# Patient Record
Sex: Male | Born: 1935 | Race: White | Hispanic: No | Smoking: Never smoker
Health system: Southern US, Community
[De-identification: ages and names within clinical notes are randomized; demographics above are authoritative.]

## PROBLEM LIST (undated history)

## (undated) DIAGNOSIS — E785 Hyperlipidemia, unspecified: Secondary | ICD-10-CM

## (undated) DIAGNOSIS — G20A1 Parkinson's disease without dyskinesia, without mention of fluctuations: Secondary | ICD-10-CM

## (undated) DIAGNOSIS — F0390 Unspecified dementia without behavioral disturbance: Secondary | ICD-10-CM

## (undated) DIAGNOSIS — C61 Malignant neoplasm of prostate: Secondary | ICD-10-CM

## (undated) DIAGNOSIS — E538 Deficiency of other specified B group vitamins: Secondary | ICD-10-CM

## (undated) DIAGNOSIS — F329 Major depressive disorder, single episode, unspecified: Secondary | ICD-10-CM

## (undated) DIAGNOSIS — I499 Cardiac arrhythmia, unspecified: Secondary | ICD-10-CM

## (undated) DIAGNOSIS — I509 Heart failure, unspecified: Secondary | ICD-10-CM

## (undated) DIAGNOSIS — G20 Parkinson's disease: Secondary | ICD-10-CM

## (undated) DIAGNOSIS — F32A Depression, unspecified: Secondary | ICD-10-CM

## (undated) HISTORY — PX: PROSTATECTOMY: SHX69

## (undated) HISTORY — DX: Deficiency of other specified B group vitamins: E53.8

## (undated) HISTORY — PX: TRANSURETHRAL RESECTION OF PROSTATE: SHX73

## (undated) HISTORY — DX: Hyperlipidemia, unspecified: E78.5

## (undated) HISTORY — DX: Cardiac arrhythmia, unspecified: I49.9

## (undated) HISTORY — DX: Major depressive disorder, single episode, unspecified: F32.9

## (undated) HISTORY — DX: Parkinson's disease without dyskinesia, without mention of fluctuations: G20.A1

## (undated) HISTORY — PX: CATARACT EXTRACTION: SUR2

## (undated) HISTORY — DX: Depression, unspecified: F32.A

## (undated) HISTORY — DX: Parkinson's disease: G20

## (undated) HISTORY — PX: APPENDECTOMY: SHX54

## (undated) HISTORY — DX: Malignant neoplasm of prostate: C61

## (undated) HISTORY — PX: ANKLE SURGERY: SHX546

---

## 2003-11-27 ENCOUNTER — Ambulatory Visit (HOSPITAL_BASED_OUTPATIENT_CLINIC_OR_DEPARTMENT_OTHER): Admission: RE | Admit: 2003-11-27 | Discharge: 2003-11-27 | Payer: Self-pay | Admitting: Ophthalmology

## 2003-11-27 ENCOUNTER — Ambulatory Visit (HOSPITAL_COMMUNITY): Admission: RE | Admit: 2003-11-27 | Discharge: 2003-11-28 | Payer: Self-pay | Admitting: Ophthalmology

## 2016-03-26 ENCOUNTER — Encounter: Payer: Self-pay | Admitting: Neurology

## 2016-03-30 ENCOUNTER — Ambulatory Visit (INDEPENDENT_AMBULATORY_CARE_PROVIDER_SITE_OTHER): Payer: Medicare Other | Admitting: Neurology

## 2016-03-30 ENCOUNTER — Encounter: Payer: Self-pay | Admitting: Neurology

## 2016-03-30 VITALS — BP 106/58 | HR 72 | Ht 69.0 in | Wt 164.0 lb

## 2016-03-30 DIAGNOSIS — G20A1 Parkinson's disease without dyskinesia, without mention of fluctuations: Secondary | ICD-10-CM

## 2016-03-30 DIAGNOSIS — F458 Other somatoform disorders: Secondary | ICD-10-CM | POA: Diagnosis not present

## 2016-03-30 DIAGNOSIS — F0280 Dementia in other diseases classified elsewhere without behavioral disturbance: Secondary | ICD-10-CM

## 2016-03-30 DIAGNOSIS — G20 Parkinson's disease: Secondary | ICD-10-CM

## 2016-03-30 DIAGNOSIS — G4700 Insomnia, unspecified: Secondary | ICD-10-CM | POA: Diagnosis not present

## 2016-03-30 DIAGNOSIS — R1319 Other dysphagia: Secondary | ICD-10-CM

## 2016-03-30 MED ORDER — CARBIDOPA-LEVODOPA 25-100 MG PO TABS: ORAL_TABLET | ORAL | Status: DC

## 2016-03-30 NOTE — Patient Instructions (Addendum)
1.  Decrease klonopin to 0.5 mg - 1.5 tablets at night for a month and if doing well drop to 1 tablet at night 2.  Let me know if you don't hear from physical, occupational or speech therapy 3.  Get copy of MR from [facility]

## 2016-03-30 NOTE — Progress Notes (Signed)
Eric Hendricks was seen today in the movement disorders clinic for neurologic consultation at the request of [name], [name], MD.  I have reviewed prior records made available to me. He is accompanied by his son who supplements the history.  Pts son reports that he was being seen by a [facility] physician for several years prior to seeing [name] but those records aren't available.  First sx was R hand tremor.  Has been on other medication prior to levodopa but doesn't remember what the medication was.  Son does state that when he was first started on carbidopa/levodopa 25/100 the dose was 2 po qid and it made him agitated and it was backed down to 1.5 in the AM and 1 tablet at 3 additional times and that is the dose that he remains on today.   The patient first saw Dr. [name] on 06/05/2015.  At that time, the patient reported right hand tremor for about 1-1/2 years and reported that he had also been on levodopa for about 1-1/2 years.  Records indicate that the patient had been on Seroquel 300 mg for several years, but had been off of it for about a year when Dr. [name] first saw the patient.  Dr. 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  The patient apparently had neuroimaging in [facility] that did not look like NPH.  He was already on Aricept, 10 mg and Namenda, 10 mg bid when first seen by Dr. [name].  Pt lives alone in independent living and is on klonopin 0.5 mg, 2 po q hs.  They do not know if this is for REM behavior disorder but state that they need orders if I would like to continue that.   He was last seen by Dr. [name] on 10/01/2015.  This is the last time he was seen at [facility].  The patient needed a neurologist closer to home, which is the reason for transfer.    Specific Symptoms:  Tremor: Yes.   (both hands per son and occasionally in head per  son) Family hx of similar:  Yes.  , brother with PD and mother may have had PD, although not formally dx Voice: softer Sleep: sleeps poor per pt (ever since death of wife) but able to get back to sleep  Vivid Dreams:  Yes.  , "some are horrible, really horrible"  Acting out dreams:  No. Wet Pillows: No. Postural symptoms:  Yes.    Falls?  Yes.  , last 6 weeks ago and that was unusual (last PT was 1.5 years ago at least) Bradykinesia symptoms: difficulty getting out of a chair Loss of smell:  unknown Loss of taste:  Yes.   Urinary Incontinence:  No. but had gross hematuria and has urology appt tomorrow Difficulty Swallowing:  Yes.  , states had swallow study 2 months ago at [facility] and was told he needed speech/swallow therapy Handwriting, micrographia: No. - writes very big because because cannot see Trouble with ADL's:  No.  Trouble buttoning clothing: Yes.   Depression:  Yes.   (dx with "rapid cycling bipolar" per son) Memory changes:  Yes.   (doesn't drive; meds distributed at independent living) Hallucinations:  No.  visual distortions: No. N/V:  No. Lightheaded:  Yes.  , but rarely  Syncope: No. Diplopia:  No. Dyskinesia:  No.   ALLERGIES:   Allergies  Allergen Reactions  . Guanfacine   . Phenylephrine   . Suprax [Cefixime]     CURRENT MEDICATIONS:  Outpatient Encounter Prescriptions as of 03/30/2016  Medication Sig  . amiodarone (PACERONE) 200 MG tablet Take 200 mg by mouth daily.  . Ascorbic Acid (VITAMIN C) 1000 MG tablet Take 1,000 mg by mouth daily.  Marland Kitchen aspirin 325 MG EC tablet Take 325 mg by mouth daily.  Marland Kitchen b complex vitamins tablet Take 1 tablet by mouth daily.  . carbidopa-levodopa (SINEMET IR) 25-100 MG tablet Take 1.5 in the morning, 1 three times daily after  . clonazePAM (KLONOPIN) 0.5 MG tablet Take 1 mg by mouth at bedtime.   . donepezil (ARICEPT) 10 MG tablet Take 10 mg by mouth at bedtime.  Marland Kitchen lisinopril (PRINIVIL,ZESTRIL) 5 MG tablet Take 5 mg by mouth  daily.  . memantine (NAMENDA) 10 MG tablet Take 10 mg by mouth 2 (two) times daily.  . mirtazapine (REMERON) 15 MG tablet Take 15 mg by mouth at bedtime.  . multivitamin-lutein (OCUVITE-LUTEIN) CAPS capsule Take 1 capsule by mouth daily.  Vladimir Faster Glycol-Propyl Glycol (SYSTANE FREE OP) Apply to eye.  . ranitidine (ZANTAC) 150 MG capsule Take 150 mg by mouth 2 (two) times daily.  . tamsulosin (FLOMAX) 0.4 MG CAPS capsule Take 0.4 mg by mouth.  . venlafaxine XR (EFFEXOR-XR) 150 MG 24 hr capsule Take 150 mg by mouth daily with breakfast.  . [DISCONTINUED] carbidopa-levodopa (SINEMET IR) 25-100 MG tablet Take by mouth. 1.5 in the morning, 1 TID  . [DISCONTINUED] omeprazole (PRILOSEC) 10 MG capsule Take 10 mg by mouth daily.  . [DISCONTINUED] pravastatin (PRAVACHOL) 20 MG tablet Take 20 mg by mouth daily.  . [DISCONTINUED] venlafaxine (EFFEXOR) 37.5 MG tablet Take 37.5 mg by mouth daily.  . [DISCONTINUED] vitamin B-12 (CYANOCOBALAMIN) 1000 MCG tablet Take 1,000 mcg by mouth daily.   No facility-administered encounter medications on file as of 03/30/2016.    PAST MEDICAL HISTORY:   Past Medical History  Diagnosis Date  . B12 deficiency   . Depression   . Hyperlipemia   . Parkinson's disease ([place])   . Prostate cancer [facility])     PAST SURGICAL HISTORY:   Past Surgical History  Procedure Laterality Date  . Appendectomy    . Prostatectomy    . Cataract extraction Bilateral   . Ankle surgery Right     SOCIAL HISTORY:   Social History   Social History  . Marital Status: Married    Spouse Name: N/A  . Number of Children: N/A  . Years of Education: N/A   Occupational History  . Not on file.   Social History Main Topics  . Smoking status: Never Smoker   . Smokeless tobacco: Not on file  . Alcohol Use: No  . Drug Use: No  . Sexual Activity: Not on file   Other Topics Concern  . Not on file   Social History Narrative    FAMILY HISTORY:   Family Status  Relation Status  Death Age  . Father Deceased     cancer  . Mother Deceased     tremor  . Brother Deceased     Parkinson's Disease  . Brother Alive     prostate cancer  . Sister Deceased     emphysema, lung cancer    ROS:  A complete 10 system review of systems was obtained and was unremarkable apart from what is mentioned above.  PHYSICAL EXAMINATION:    VITALS:   Filed  Vitals:   03/30/16 1326  BP: 106/58  Pulse: 72  Height: 5\' 9"  (1.753 m)  Weight: 164 lb (74.39 kg)    GEN:  The patient appears stated age and is in NAD. HEENT:  Normocephalic, atraumatic.  The mucous membranes are moist. The superficial temporal arteries are without ropiness or tenderness. CV:  RRR Lungs:  CTAB Neck/HEME:  There are no carotid bruits bilaterally.  Neurological examination:  Orientation: Attempted MoCA but patient was far too [place] and could not be completed.  The patient is alert and oriented x3.  Cranial nerves: There is good facial symmetry. There is facial hypomimia.  Pupils are equal round and reactive to light bilaterally. Fundoscopic exam reveals clear margins bilaterally. Extraocular muscles are intact. The visual fields are full to confrontational testing. The speech is fluent and clear. He is hypophonic.  Soft palate rises symmetrically and there is no tongue deviation. Hearing is markedly decreased to conversational tone. Sensation: Sensation is intact to light and pinprick throughout (facial, trunk, extremities). Vibration is decreased at the bilateral big toe. There is no extinction with double simultaneous stimulation. There is no sensory dermatomal level identified. Motor: Strength is 5/5 in the bilateral upper and lower extremities.   Shoulder shrug is equal and symmetric.  There is no pronator drift. Deep tendon reflexes: Deep tendon reflexes are 1/4 at the bilateral biceps, triceps, brachioradialis, patella and achilles. Plantar responses are downgoing bilaterally.  Movement examination: Tone:  There is mild increased tone in the RUE.  There is normal tone elsewhere.   Abnormal movements: none Coordination:  There is minor decremation with RAM's, seen mostly with heel taps on the L Gait and Station: The patient has mild difficulty arising out of a deep-seated chair without the use of the hands (he is able to arise but falls back initially into the chair when not using the hands). The patient's stride length is short but wide based.       ASSESSMENT/PLAN:  1.  Parkinsonism.  Could be idiopathic PD but wonder if this isn't vascular parkinsonism given the wide based gait. Nonetheless, I didn't change his meds today as he looked pretty good and thinks that they are helping.    -continue carbidopa/levodopa 25/100, 1.5 tablets in the AM followed by 1 tablet three other times during the day. Refilled that today  -We discussed the diagnosis as well as pathophysiology of the disease.  We discussed treatment options as well as prognostic indicators.  Patient education was provided.  -Greater than 50% of the 60 minute visit was spent in counseling answering questions and talking about what to expect now as well as in the future.    We talked about safety in the home. Living independently in assisted living but meds/food being given to him.  Not driving.  Has a safe environment.   -will order PT/OT/ST to come to independent living  -noted that pt on amiodarone.  Don't have his old records so not sure how long he has been on the medication (was not listed as an outpatient prescriptions when the patient was seen at [facility]) but while tremor is common on amiodarone (about 33% of patients), parkinsonism is not a common side effect, but it also is not a side effect that is not unheard of.  This should be considered as we treat him.  -I asked the patient/son to get me a copy of his MRI of the brain from [facility]    2.  Dysphagia  -Had MBE  on 02/24/16 at [place] and I reviewed and demonstrated  trace silent aspiration with thin liquids.  There was mild-mod oropharyngeal dysphagia.    ST was recommended and small bites with single small sips was recommended.  Ordered ST today  3.   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  He is on 1 mg at night and would like to see this tapered at least a little bit.  I am going to have him drop this to clonazepam 0.5 mg, 1-1/2 tablets at night for a month and if he tolerates this well, then I will have him drop this to clonazepam 0.5 mg, one tablet at night.  4.  Probable memory loss/dementia  -As above, a MoCA was attempted, but ultimately had to be abandoned because the patient could not hear and did not have his hearing aids in.  -Will remain on Namenda, 10 mg twice a day and Aricept, 10 mg daily.  5.  I will plan on seeing him back in the next 3-4 months, sooner should new neurologic issues arise.  Greater than 50% of the 60 minute visit in counseling and coordinating care.

## 2016-04-14 ENCOUNTER — Telehealth: Payer: Self-pay | Admitting: Neurology

## 2016-04-15 NOTE — Telephone Encounter (Signed)
Spoke with patient's son and he states that they have not heard from home health company about starting patient's therapies. He then said his dad said he was starting therapy Monday. Wrote message to [facility] home health to contact patient's son to keep him in the loop of patient's therapies since patient's memory is not so good. Patient's son agreed with this plan and will call back if needed.

## 2016-04-20 ENCOUNTER — Telehealth: Payer: Self-pay | Admitting: Neurology

## 2016-04-20 NOTE — Telephone Encounter (Addendum)
Deneise Lever, PT with [facility], called to request verbal order for Physical therapy 2 x wk for 4 weeks. [id number]. Order given.

## 2016-04-21 ENCOUNTER — Telehealth: Payer: Self-pay | Admitting: Neurology

## 2016-04-21 NOTE — Telephone Encounter (Signed)
Sonal, ST with [facility], called for verbal orders to see patient 2 times weekly for four weeks. Verbal order given. [id number].

## 2016-05-12 ENCOUNTER — Telehealth: Payer: Self-pay | Admitting: Neurology

## 2016-05-12 NOTE — Telephone Encounter (Signed)
Verbal ok to continue PT 2xs week for 2 weeks Please call Hilliard Clark at [facility] - can leave him a message.

## 2016-05-13 NOTE — Telephone Encounter (Signed)
LMOM with okay for PT.

## 2016-08-04 NOTE — Progress Notes (Signed)
Eric Hendricks was seen today in the movement disorders clinic for neurologic consultation at the request of [name], Eric Mate, MD.  I have reviewed prior records made available to me. He is accompanied by his son who supplements the history.  Pts son reports that he was being seen by a [facility] physician for several years prior to seeing Dr Eric Hendricks but those records aren't available.  First sx was R hand tremor.  Has been on other medication prior to levodopa but doesn't remember what the medication was.  Son does state that when he was first started on carbidopa/levodopa 25/100 the dose was 2 po qid and it made him agitated and it was backed down to 1.5 in the AM and 1 tablet at 3 additional times and that is the dose that he remains on today.   The patient first saw Dr. Anna Hendricks on 06/05/2015.  At that time, the patient reported right hand tremor for about 1-1/2 years and reported that he had also been on levodopa for about 1-1/2 years.  Records indicate that the patient had been on Seroquel 300 mg for several years, but had been off of it for about a year when Dr. Anna Hendricks first saw the patient.  Dr. Anna Hendricks felt that the patient likely had idiopathic Parkinson's disease, but felt that vascular parkinsonism, and even NPH was in the differential, given the patient had wide-based gait.  The patient apparently had neuroimaging in [facility] that did not look like NPH.  He was already on Aricept, 10 mg and Namenda, 10 mg bid when first seen by Dr. Anna Hendricks.  Pt lives alone in independent living and is on klonopin 0.5 mg, 2 po q hs.  They do not know if this is for REM behavior disorder but state that they need orders if I would like to continue that.   He was last seen by Dr. Anna Hendricks on 10/01/2015.  This is the last time he was seen at [facility].  The patient needed a neurologist closer to home, which is the reason for transfer.    08/05/16 update:  The patient returns today for follow-up, accompanied by his son who  supplements the history.  He remains on carbidopa/levodopa 25/100, 1-1/2 tablets in the morning, followed by one tablet 3 additional times throughout the day.  Last visit, I asked him to try to wean his nighttime clonazepam down somewhat to see if he would be able to get down to 0.5 mg, one tablet at night (he was at 2 tablets at night).  He states that he didn't do that and son asks me to write a note to the facility to do that and they will try that as facility gives him his meds.  He remains on Aricept and Namenda for memory change.  He is an independent living and states that he is doing well with this.  He did home PT.  One fall but really was a passing out episode.  He was taken to [facility] and had a PPM placed on 06/17/16.  Records reviewed.  Had bradycardia with LBBB and 40 pt drop in SBP with orthostatics.  Had a small SDH.  F/u with neurology 07/13/16 and they let him restart his ASA (saw NP - those records reviewed).   No hallucinations.  Mood has been good.  Feels good now.  Denies headache.  Denies lateralizing weakness or paresthesias.  C/o constipation   ALLERGIES:   Allergies  Allergen Reactions  . Guanfacine   .  Phenylephrine   . Suprax [Cefixime]     CURRENT MEDICATIONS:  Outpatient Encounter Prescriptions as of 08/05/2016  Medication Sig  . amiodarone (PACERONE) 200 MG tablet Take 200 mg by mouth daily.  . Ascorbic Acid (VITAMIN C) 1000 MG tablet Take 1,000 mg by mouth daily.  Marland Kitchen aspirin 325 MG EC tablet Take 325 mg by mouth daily.  Marland Kitchen b complex vitamins tablet Take 1 tablet by mouth daily.  . carbidopa-levodopa (SINEMET IR) 25-100 MG tablet Take 1.5 in the morning, 1 three times daily after  . clonazePAM (KLONOPIN) 0.5 MG tablet Take 1 tablet (0.5 mg total) by mouth at bedtime.  . docusate sodium (COLACE) 100 MG capsule Take 100 mg by mouth 2 (two) times daily.  Marland Kitchen donepezil (ARICEPT) 10 MG tablet Take 10 mg by mouth at bedtime.  Marland Kitchen lisinopril (PRINIVIL,ZESTRIL) 5 MG tablet Take 5  mg by mouth daily.  . memantine (NAMENDA) 10 MG tablet Take 10 mg by mouth 2 (two) times daily.  . mirtazapine (REMERON) 15 MG tablet Take 15 mg by mouth at bedtime.  . multivitamin-lutein (OCUVITE-LUTEIN) CAPS capsule Take 1 capsule by mouth daily.  Vladimir Faster Glycol-Propyl Glycol (SYSTANE FREE OP) Apply to eye.  . venlafaxine XR (EFFEXOR-XR) 150 MG 24 hr capsule Take 150 mg by mouth daily with breakfast.  . [DISCONTINUED] clonazePAM (KLONOPIN) 0.5 MG tablet Take 1 mg by mouth at bedtime.   . [DISCONTINUED] ranitidine (ZANTAC) 150 MG capsule Take 150 mg by mouth 2 (two) times daily.  . [DISCONTINUED] tamsulosin (FLOMAX) 0.4 MG CAPS capsule Take 0.4 mg by mouth.   No facility-administered encounter medications on file as of 08/05/2016.     PAST MEDICAL HISTORY:   Past Medical History:  Diagnosis Date  . B12 deficiency   . Depression   . Hyperlipemia   . Parkinson's disease ([place])   . Prostate cancer ([place])     PAST SURGICAL HISTORY:   Past Surgical History:  Procedure Laterality Date  . ANKLE SURGERY Right   . APPENDECTOMY    . CATARACT EXTRACTION Bilateral   . PROSTATECTOMY      SOCIAL HISTORY:   Social History   Social History  . Marital status: Married    Spouse name: N/A  . Number of children: N/A  . Years of education: N/A   Occupational History  . Not on file.   Social History Main Topics  . Smoking status: Never Smoker  . Smokeless tobacco: Not on file  . Alcohol use No  . Drug use: No  . Sexual activity: Not on file   Other Topics Concern  . Not on file   Social History Narrative  . No narrative on file    FAMILY HISTORY:   Family Status  Relation Status  . Father Deceased   cancer  . Mother Deceased   tremor  . Brother Deceased   Parkinson's Disease  . Brother Alive   prostate cancer  . Sister Deceased   emphysema, lung cancer    ROS:  A complete 10 system review of systems was obtained and was unremarkable apart from what is mentioned  above.  PHYSICAL EXAMINATION:    VITALS:   Vitals:   08/05/16 1304  BP: 130/68  Pulse: 78  Weight: 171 lb (77.6 kg)  Height: 5\' 9"  (1.753 m)    GEN:  The patient appears stated age and is in NAD. HEENT:  Normocephalic, atraumatic.  The mucous membranes are moist. The superficial temporal arteries are without  ropiness or tenderness. CV:  RRR Lungs:  CTAB Neck/HEME:  There are no carotid bruits bilaterally.  Neurological examination:  Orientation: Pt is alert but relies on son for history.   Cranial nerves: There is good facial symmetry. There is facial hypomimia.   Extraocular muscles are intact. The visual fields are full to confrontational testing. The speech is fluent and clear. He is hypophonic.  Soft palate rises symmetrically and there is no tongue deviation. Hearing is markedly to conversational tone but he has hearing aids in today. Sensation: Sensation is intact to light and pinprick throughout (facial, trunk, extremities). Vibration is decreased at the bilateral big toe. There is no extinction with double simultaneous stimulation. There is no sensory dermatomal level identified. Motor: Strength is 5/5 in the bilateral upper and lower extremities.   Shoulder shrug is equal and symmetric.  There is no pronator drift. Deep tendon reflexes: Deep tendon reflexes are 1/4 at the bilateral biceps, triceps, brachioradialis, patella and achilles. Plantar responses are downgoing bilaterally.  Movement examination: Tone: There is mild increased tone in the RUE.  There is normal tone elsewhere.   Abnormal movements: none Coordination:  There is minor decremation with RAM's, seen mostly with heel taps on the L Gait and Station: The patient easily is arises out of chair.  The patient's stride length is short but wide based.  He shuffles just slightly.     ASSESSMENT/PLAN:  1.  Parkinsonism.  Could be idiopathic PD but wonder if this isn't vascular parkinsonism given the wide based gait.  Nonetheless, I didn't change his meds today as he looked pretty good and thinks that they are helping.    -continue carbidopa/levodopa 25/100, 1.5 tablets in the AM followed by 1 tablet three other times during the day.  -We talked about safety in the home. Living independently in assisted living but meds/food being given to him.  Not driving.  Has a safe environment.   -noted that pt on amiodarone.  Don't have his old records so not sure how long he has been on the medication (was not listed as an outpatient prescriptions when the patient was seen at [facility]) but while tremor is common on amiodarone (about 33% of patients), parkinsonism is not a common side effect, but it also is not a side effect that is not unheard of.  This should be considered as we treat him.  2.  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  Patient is doing much better.  3.  Dysphagia  -Had MBE on 02/24/16 at [place] and I reviewed and demonstrated trace silent aspiration with thin liquids.  There was mild-mod oropharyngeal dysphagia.    ST was recommended and small bites with single small sips was recommended.    4.   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  5.  Probable memory loss/dementia  -Will remain on Namenda, 10 mg twice a day and Aricept, 10 mg daily.  6.  Constipation  -rancho recipe given  7.  I will plan on seeing him back in the next 6 months, sooner should new neurologic issues arise.  Greater than 50% of the 60 minute visit in counseling and coordinating care.  Much greater than 50% of this visit was spent in counseling and coordinating care.  Total face to face time:  25 min

## 2016-08-05 ENCOUNTER — Ambulatory Visit (INDEPENDENT_AMBULATORY_CARE_PROVIDER_SITE_OTHER): Payer: Medicare Other | Admitting: Neurology

## 2016-08-05 ENCOUNTER — Encounter: Payer: Self-pay | Admitting: Neurology

## 2016-08-05 VITALS — BP 130/68 | HR 78 | Ht 69.0 in | Wt 171.0 lb

## 2016-08-05 DIAGNOSIS — G20 Parkinson's disease: Secondary | ICD-10-CM | POA: Diagnosis not present

## 2016-08-05 DIAGNOSIS — F0280 Dementia in other diseases classified elsewhere without behavioral disturbance: Secondary | ICD-10-CM

## 2016-08-05 MED ORDER — CLONAZEPAM 0.5 MG PO TABS: 0.5000 mg | ORAL_TABLET | Freq: Every day | ORAL | 5 refills | Status: DC

## 2016-08-05 NOTE — Patient Instructions (Addendum)
1.  Decrease klonopin 0.5 mg - 1 tablet at night 2.  You look great! 3.  I hope that you have a great thanksgiving and Christmas! 4.  Constipation and Parkinson's disease:   1.Rancho recipe for constipation in Parkinsons Disease:  -1 cup of unprocessed bran (need to get this at [employer], [employer] or similar type of store), 2 cups of applesauce in 1 cup of  prune juice  2.  Increase fiber intake (Metamucil,vegetables)  3.  Regular, moderate exercise can be beneficial.  4.  Avoid medications causing constipation, such as medications like  antacids with calcium or magnesium  5.  Laxative overuse should be avoided.  6.  Stool softeners (Colace) can help with chronic constipation.  7.  Increase water intake.  You should be drinking 1/2 gallon of water  a day as long as you have not been diagnosed with congestive heart  failure or renal/kidney failure.  This is probably the single greatest  thing that you can do to help your constipation.

## 2016-09-08 ENCOUNTER — Other Ambulatory Visit: Payer: Self-pay | Admitting: Neurology

## 2016-12-28 ENCOUNTER — Encounter: Payer: Self-pay | Admitting: Emergency Medicine

## 2016-12-28 ENCOUNTER — Emergency Department (INDEPENDENT_AMBULATORY_CARE_PROVIDER_SITE_OTHER)
Admission: EM | Admit: 2016-12-28 | Discharge: 2016-12-28 | Disposition: A | Discharge status: Home / Self Care | Payer: Medicare Other | Source: Home / Self Care | Attending: Family Medicine | Admitting: Family Medicine

## 2016-12-28 DIAGNOSIS — S61012A Laceration without foreign body of left thumb without damage to nail, initial encounter: Secondary | ICD-10-CM | POA: Diagnosis not present

## 2016-12-28 HISTORY — DX: Heart failure, unspecified: I50.9

## 2016-12-28 NOTE — ED Triage Notes (Signed)
Pt c/o cutting left hand with box cutter this afternoon. States the nurse at the rest home cleaned and bandaged it. Unsure of last tetanus.

## 2016-12-28 NOTE — ED Triage Notes (Signed)
Spoke with susan the nurse at [facility] pts PCP, states he had his tetanus in January 2014

## 2016-12-28 NOTE — ED Provider Notes (Signed)
[name]    CSN: [id number] Arrival date & time: 12/28/16  1521     History   Chief Complaint Chief Complaint  Patient presents with  . Laceration    HPI Eric Hendricks is a 83 y.o. male.   Patient cut his left thumb with a box cutter about 1.5 hours ago.  His last Tdap was in 2014.   The history is provided by the patient and a relative.  Laceration  Location:  Finger Finger laceration location:  L thumb Length:  1.5cm Depth:  Through dermis Quality: straight   Bleeding: controlled   Time since incident:  90 minutes Laceration mechanism:  Knife Pain details:    Quality:  Aching   Severity:  Mild   Timing:  Constant   Progression:  Unchanged Foreign body present:  No foreign bodies Worsened by:  Movement Ineffective treatments:  None tried Tetanus status:  Up to date Associated symptoms: swelling     Past Medical History:  Diagnosis Date  . B12 deficiency   . Depression   . Hyperlipemia   . Parkinson's disease ([place])   . Prostate cancer ([place])     There are no active problems to display for this patient.   Past Surgical History:  Procedure Laterality Date  . ANKLE SURGERY Right   . APPENDECTOMY    . CATARACT EXTRACTION Bilateral   . PROSTATECTOMY         Home Medications    Prior to Admission medications   Medication Sig Start Date End Date Taking? Authorizing Provider  amiodarone (PACERONE) 200 MG tablet Take 200 mg by mouth daily.    Historical Provider, MD  Ascorbic Acid (VITAMIN C) 1000 MG tablet Take 1,000 mg by mouth daily.    Historical Provider, MD  aspirin 325 MG EC tablet Take 325 mg by mouth daily.    Historical Provider, MD  b complex vitamins tablet Take 1 tablet by mouth daily.    Historical Provider, MD  carbidopa-levodopa (SINEMET IR) 25-100 MG tablet TAKE 1 & 1/2 TABLET BY MOUTH EVERY MORNING TAKE 1 TABLET BY MOUTH THREE TIMES A DAY 09/08/16   Rebecca S Tat, DO  clonazePAM (KLONOPIN) 0.5 MG tablet Take 1 tablet  (0.5 mg total) by mouth at bedtime. 08/05/16   Eustace Quail Tat, DO  docusate sodium (COLACE) 100 MG capsule Take 100 mg by mouth 2 (two) times daily.    Historical Provider, MD  donepezil (ARICEPT) 10 MG tablet Take 10 mg by mouth at bedtime.    Historical Provider, MD  lisinopril (PRINIVIL,ZESTRIL) 5 MG tablet Take 5 mg by mouth daily.    Historical Provider, MD  memantine (NAMENDA) 10 MG tablet Take 10 mg by mouth 2 (two) times daily.    Historical Provider, MD  mirtazapine (REMERON) 15 MG tablet Take 15 mg by mouth at bedtime.    Historical Provider, MD  multivitamin-lutein (OCUVITE-LUTEIN) CAPS capsule Take 1 capsule by mouth daily.    Historical Provider, MD  Polyethyl Glycol-Propyl Glycol (SYSTANE FREE OP) Apply to eye.    Historical Provider, MD  venlafaxine XR (EFFEXOR-XR) 150 MG 24 hr capsule Take 150 mg by mouth daily with breakfast.    Historical Provider, MD    Family History History reviewed. No pertinent family history.  Social History Social History  Substance Use Topics  . Smoking status: Never Smoker  . Smokeless tobacco: Never Used  . Alcohol use No     Allergies   Guanfacine; Phenylephrine; and Suprax [  cefixime]   Review of Systems Review of Systems  All other systems reviewed and are negative.    Physical Exam Triage Vital Signs ED Triage Vitals [12/28/16 1551]  Enc Vitals Group     BP (!) 110/58     Pulse Rate 76     Resp      Temp 97.8 F (36.6 C)     Temp Source Oral     SpO2 94 %     Weight 174 lb (78.9 kg)     Height      Head Circumference      Peak Flow      Pain Score 2     Pain Loc      Pain Edu?      Excl. in [place]?    No data found.   Updated Vital Signs BP (!) 110/58 (BP Location: Right Arm)   Pulse 76   Temp 97.8 F (36.6 C) (Oral)   Wt 174 lb (78.9 kg)   SpO2 94%   BMI 25.70 kg/m   Visual Acuity Right Eye Distance:   Left Eye Distance:   Bilateral Distance:    Right Eye Near:   Left Eye Near:    Bilateral Near:      Physical Exam  Constitutional: He appears well-developed and well-nourished. No distress.  HENT:  Head: Atraumatic.  Eyes: Pupils are equal, round, and reactive to light.  Cardiovascular: Normal rate.   Pulmonary/Chest: Effort normal.  Musculoskeletal:       Left hand: He exhibits decreased range of motion and laceration. He exhibits no tenderness and no swelling. Decreased sensation noted.       Hands: Left thumb has a 1.5cm simple laceration proximal phalanx dorsally as noted on diagram.  Finger has full range of motion.  Distal neurovascular function is intact.   Neurological: He is alert.  Skin: Skin is warm and dry.  Nursing note and vitals reviewed.    UC Treatments / Results  Labs (all labs ordered are listed, but only abnormal results are displayed) Labs Reviewed - No data to display  EKG  EKG Interpretation None       Radiology No results found.  Procedures Procedures  Laceration Repair Discussed benefits and risks of procedure and verbal consent obtained. Using sterile technique and digital anesthesia with 2% lidocaine without epinephrine, cleansed wound with Betadine followed by copious lavage with normal saline.  Wound carefully inspected for debris and foreign bodies; none found.  Wound closed with #5, 4-0 interrupted Prolene sutures.  Bacitracin and non-stick sterile dressing applied.  Wound precautions explained to patient.  Return for suture removal in 10 days.     Medications Ordered in UC Medications - No data to display   Initial Impression / Assessment and Plan / UC Course  I have reviewed the triage vital signs and the nursing notes.  Pertinent labs & imaging results that were available during my care of the patient were reviewed by me and considered in my medical decision making (see chart for details).    Change dressing daily and apply Bacitracin ointment to wound.  Keep wound clean and dry.  Return for any signs of infection (or follow-up  with family doctor):  Increasing redness, swelling, pain, heat, drainage, etc. Return in 10 days for suture removal.      Final Clinical Impressions(s) / UC Diagnoses   Final diagnoses:  Laceration of left thumb without foreign body without damage to nail, initial encounter  New Prescriptions New Prescriptions   No medications on file     Kandra Nicolas, MD 12/28/16 1642

## 2016-12-28 NOTE — Discharge Instructions (Signed)
Change dressing daily and apply Bacitracin ointment to wound.  Keep wound clean and dry.  Return for any signs of infection (or follow-up with family doctor):  Increasing redness, swelling, pain, heat, drainage, etc. °Return in 10 days for suture removal.   °

## 2017-01-07 ENCOUNTER — Encounter: Payer: Self-pay | Admitting: *Deleted

## 2017-01-24 ENCOUNTER — Other Ambulatory Visit: Payer: Self-pay | Admitting: Neurology

## 2017-02-01 NOTE — Progress Notes (Signed)
Eric Hendricks was seen today in the movement disorders clinic for neurologic consultation at the request of [name], MD.  I have reviewed prior records made available to me. He is accompanied by his son who supplements the history.  Pts son reports that he was being seen by a [facility] physician for several years prior to seeing [name] but those records aren't available.  First sx was R hand tremor.  Has been on other medication prior to levodopa but doesn't remember what the medication was.  Son does state that when he was first started on carbidopa/levodopa 25/100 the dose was 2 po qid and it made him agitated and it was backed down to 1.5 in the AM and 1 tablet at 3 additional times and that is the dose that he remains on today.   The patient first saw Dr. [name] on 06/05/2015.  At that time, the patient reported right hand tremor for about 1-1/2 years and reported that he had also been on levodopa for about 1-1/2 years.  Records indicate that the patient had been on Seroquel 300 mg for several years, but had been off of it for about a year when Dr. [name] first saw the patient.  Dr. 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  The patient apparently had neuroimaging in [facility] that did not look like NPH.  He was already on Aricept, 10 mg and Namenda, 10 mg bid when first seen by Dr. [name].  Pt lives alone in independent living and is on klonopin 0.5 mg, 2 po q hs.  They do not know if this is for REM behavior disorder but state that they need orders if I would like to continue that.   He was last seen by Dr. [name] on 10/01/2015.  This is the last time he was seen at [facility].  The patient needed a neurologist closer to home, which is the reason for transfer.    08/05/16 update:  The patient returns today for follow-up, accompanied by his son who  supplements the history.  He remains on carbidopa/levodopa 25/100, 1-1/2 tablets in the morning, followed by one tablet 3 additional times throughout the day.  Last visit, I asked him to try to wean his nighttime clonazepam down somewhat to see if he would be able to get down to 0.5 mg, one tablet at night (he was at 2 tablets at night).  He states that he didn't do that and son asks me to write a note to the facility to do that and they will try that as facility gives him his meds.  He remains on Aricept and Namenda for memory change.  He is an independent living and states that he is doing well with this.  He did home PT.  One fall but really was a passing out episode.  He was taken to [facility] and had a PPM placed on 06/17/16.  Records reviewed.  Had bradycardia with LBBB and 40 pt drop in SBP with orthostatics.  Had a small SDH.  F/u with neurology 07/13/16 and they let him restart his ASA (saw NP - those records reviewed).   No hallucinations.  Mood has been good.  Feels good now.  Denies headache.  Denies lateralizing weakness or paresthesias.  C/o constipation  02/03/17 update:  The patient returns today for follow up.  This patient is  accompanied in the office by his son who supplements the history.  He moved down downstairs to a 2 bedroom apartment.  Someone brings him medication 4 times per day.  Cooking, cleaning is done for him.   Pt on carbidopa/levodopa 25/100, 1.5 tablets in the morning and 3 other single tablets throughout the day.  I asked him last visit to decrease his clonazepam to 0.5 mg, one tablet at night (from 2 tablets on) and he has done that.  He remains on Aricept and Namenda.  He was in the emergency room on 12/28/2016 after he cut his thumb with a box cutter.  This was closed with sutures.  Fell right before christmas as missed stairs and fell.  One other fall in hallway - turned to fast and fell but got himself up and didn't get hurt.  No hallucinations.     ALLERGIES:   Allergies    Allergen Reactions  . Guanfacine   . Phenylephrine   . Suprax [Cefixime]     CURRENT MEDICATIONS:  Outpatient Encounter Prescriptions as of 02/03/2017  Medication Sig  . amiodarone (PACERONE) 200 MG tablet Take 200 mg by mouth daily.  . Ascorbic Acid (VITAMIN C) 1000 MG tablet Take 1,000 mg by mouth daily.  Marland Kitchen aspirin EC 81 MG tablet Take 81 mg by mouth daily.  Marland Kitchen b complex vitamins tablet Take 1 tablet by mouth daily.  . carbidopa-levodopa (SINEMET IR) 25-100 MG tablet TAKE 1 & 1/2 TABLET BY MOUTH EVERY MORNING TAKE 1 TABLET BY MOUTH THREE TIMES A DAY  . clonazePAM (KLONOPIN) 0.5 MG tablet TAKE 1 TABLET BY MOUTH AT BEDTIME  . docusate sodium (COLACE) 100 MG capsule Take 100 mg by mouth 2 (two) times daily.  Marland Kitchen lisinopril (PRINIVIL,ZESTRIL) 5 MG tablet Take 5 mg by mouth daily.  . memantine (NAMENDA) 10 MG tablet Take 10 mg by mouth 2 (two) times daily.  . mirtazapine (REMERON) 15 MG tablet Take 15 mg by mouth at bedtime.  . multivitamin-lutein (OCUVITE-LUTEIN) CAPS capsule Take 1 capsule by mouth daily.  Marland Kitchen venlafaxine XR (EFFEXOR-XR) 150 MG 24 hr capsule Take 150 mg by mouth daily with breakfast.  . [DISCONTINUED] aspirin 325 MG EC tablet Take 325 mg by mouth daily.  . [DISCONTINUED] donepezil (ARICEPT) 10 MG tablet Take 10 mg by mouth at bedtime.  . [DISCONTINUED] Furosemide (LASIX PO) Take by mouth 2 (two) times daily.  . [DISCONTINUED] Polyethyl Glycol-Propyl Glycol (SYSTANE FREE OP) Apply to eye.   No facility-administered encounter medications on file as of 02/03/2017.     PAST MEDICAL HISTORY:   Past Medical History:  Diagnosis Date  . B12 deficiency   . CHF (congestive heart failure) ([place])   . Depression   . Hyperlipemia   . Parkinson's disease ([place])   . Prostate cancer ([place])     PAST SURGICAL HISTORY:   Past Surgical History:  Procedure Laterality Date  . ANKLE SURGERY Right   . APPENDECTOMY    . CATARACT EXTRACTION Bilateral   . PROSTATECTOMY      SOCIAL  HISTORY:   Social History   Social History  . Marital status: Married    Spouse name: N/A  . Number of children: N/A  . Years of education: N/A   Occupational History  . Not on file.   Social History Main Topics  . Smoking status: Never Smoker  . Smokeless tobacco: Never Used  . Alcohol use No  . Drug use: No  . Sexual activity: Not on  file   Other Topics Concern  . Not on file   Social History Narrative  . No narrative on file    FAMILY HISTORY:   Family Status  Relation Status  . Father Deceased       cancer  . Mother Deceased       tremor  . Brother Deceased       Parkinson's Disease  . Brother Alive       prostate cancer  . Sister Deceased       emphysema, lung cancer    ROS:  A complete 10 system review of systems was obtained and was unremarkable apart from what is mentioned above.  PHYSICAL EXAMINATION:    VITALS:   Vitals:   02/03/17 1301  BP: 100/62  Pulse: 76  SpO2: 92%  Weight: 169 lb (76.7 kg)  Height: 5\' 9"  (1.753 m)    GEN:  The patient appears stated age and is in NAD. HEENT:  Normocephalic, atraumatic.  The mucous membranes are moist. The superficial temporal arteries are without ropiness or tenderness. CV:  RRR Lungs:  CTAB Neck/HEME:  There are no carotid bruits bilaterally.  Neurological examination:  Orientation:  Montreal Cognitive Assessment  02/03/2017  Visuospatial/ Executive (0/5) 2  Naming (0/3) 2  Attention: Read list of digits (0/2) 2  Attention: Read list of letters (0/1) 0  Attention: Serial 7 subtraction starting at 100 (0/3) 1  Language: Repeat phrase (0/2) 2  Language : Fluency (0/1) 1  Abstraction (0/2) 2  Delayed Recall (0/5) 0  Orientation (0/6) 2  Total 14  Adjusted Score (based on education) 14   Cranial nerves: There is good facial symmetry. There is facial hypomimia.   Extraocular muscles are intact. The visual fields are full to confrontational testing. The speech is fluent and clear. He is  hypophonic.  Soft palate rises symmetrically and there is no tongue deviation. Hearing is markedly to conversational tone but he has hearing aids in today. Sensation: Sensation is intact to light and pinprick throughout (facial, trunk, extremities). Vibration is decreased at the bilateral big toe. There is no extinction with double simultaneous stimulation. There is no sensory dermatomal level identified. Motor: Strength is 5/5 in the bilateral upper and lower extremities.   Shoulder shrug is equal and symmetric.  There is no pronator drift. Deep tendon reflexes: Deep tendon reflexes are 1/4 at the bilateral biceps, triceps, brachioradialis, patella and achilles. Plantar responses are downgoing bilaterally.  Movement examination: Tone: There is good tone in the UE/LE today Abnormal movements: none Coordination:  There is minor decremation with RAM's, seen mostly with heel taps on the L Gait and Station: The patient easily is arises out of chair.  The patient's stride length is short but wide based.  He shuffles just slightly.     ASSESSMENT/PLAN:  1.  Parkinsonism.  Could be idiopathic PD but wonder if this isn't vascular parkinsonism given the wide based gait. Nonetheless, I didn't change his meds today as he looked pretty good and thinks that they are helping.    -continue carbidopa/levodopa 25/100, 1.5 tablets in the AM followed by 1 tablet three other times during the day.  -We talked about safety in the home. Living independently in assisted living but meds/food being given to him.  Not driving.  Has a safe environment. Asked him to use his walker at all times and discussed morbidity and mortality with falls.    -noted that pt on amiodarone.  Don't have his old  records so not sure how long he has been on the medication (was not listed as an outpatient prescriptions when the patient was seen at [facility]) but while tremor is common on amiodarone (about 33% of patients), parkinsonism is not a common  side effect, but it also is not a side effect that is not unheard of.  This should be considered as we treat him.  2.  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  Patient is doing much better.  3.  Dysphagia  -Had MBE on 02/24/16 at [facility] and I reviewed and demonstrated trace silent aspiration with thin liquids.  There was mild-mod oropharyngeal dysphagia.    ST was recommended and small bites with single small sips was recommended.    4.   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  Probable memory loss/dementia  -Will remain on Namenda, 10 mg twice a day and Aricept, 10 mg daily.  6.  Constipation  -rancho recipe given  7.  I will plan on seeing him back in the next 6 months, sooner should new neurologic issues arise.  Greater than 50% of the 60 minute visit in counseling and coordinating care.  Much greater than 50% of this visit was spent in counseling and coordinating care.  Total face to face time:  25 min

## 2017-02-03 ENCOUNTER — Ambulatory Visit (INDEPENDENT_AMBULATORY_CARE_PROVIDER_SITE_OTHER): Payer: Medicare Other | Admitting: Neurology

## 2017-02-03 ENCOUNTER — Encounter: Payer: Self-pay | Admitting: Neurology

## 2017-02-03 VITALS — BP 100/62 | HR 76 | Ht 69.0 in | Wt 169.0 lb

## 2017-02-03 DIAGNOSIS — F0280 Dementia in other diseases classified elsewhere without behavioral disturbance: Secondary | ICD-10-CM | POA: Diagnosis not present

## 2017-02-03 DIAGNOSIS — G20 Parkinson's disease: Secondary | ICD-10-CM

## 2017-02-03 NOTE — Patient Instructions (Signed)
Use your walker at all times!  I will see you in 6 months.

## 2017-02-18 ENCOUNTER — Other Ambulatory Visit: Payer: Self-pay | Admitting: Neurology

## 2017-03-10 ENCOUNTER — Other Ambulatory Visit: Payer: Self-pay | Admitting: Neurology

## 2017-05-15 ENCOUNTER — Emergency Department (INDEPENDENT_AMBULATORY_CARE_PROVIDER_SITE_OTHER): Payer: Medicare Other

## 2017-05-15 ENCOUNTER — Emergency Department (INDEPENDENT_AMBULATORY_CARE_PROVIDER_SITE_OTHER)
Admission: EM | Admit: 2017-05-15 | Discharge: 2017-05-15 | Disposition: A | Discharge status: Home / Self Care | Payer: Medicare Other | Source: Home / Self Care

## 2017-05-15 ENCOUNTER — Encounter: Payer: Self-pay | Admitting: Emergency Medicine

## 2017-05-15 DIAGNOSIS — J189 Pneumonia, unspecified organism: Secondary | ICD-10-CM

## 2017-05-15 DIAGNOSIS — J90 Pleural effusion, not elsewhere classified: Secondary | ICD-10-CM | POA: Diagnosis not present

## 2017-05-15 LAB — POCT RAPID STREP A (OFFICE): RAPID STREP A SCREEN: NEGATIVE

## 2017-05-15 MED ORDER — ONDANSETRON 4 MG PO TBDP
4.0000 mg | ORAL_TABLET | Freq: Once | ORAL | Status: AC
  Administered 2017-05-15: 4 mg via ORAL

## 2017-05-15 MED ORDER — AMOXICILLIN-POT CLAVULANATE 875-125 MG PO TABS: 1.0000 | ORAL_TABLET | Freq: Two times a day (BID) | ORAL | 0 refills | Status: DC

## 2017-05-15 NOTE — ED Provider Notes (Signed)
[name]    CSN: [id number] Arrival date & time: 05/15/17  1535     History   Chief Complaint Chief Complaint  Patient presents with  . Sore Throat    HPI Eric Hendricks is a 83 y.o. male.   The history is provided by the patient. No language interpreter was used.  Sore Throat  This is a new problem. The current episode started 6 to 12 hours ago. The problem occurs constantly. The problem has been gradually worsening. Pertinent negatives include no shortness of breath. Nothing aggravates the symptoms. Nothing relieves the symptoms. He has tried nothing for the symptoms. The treatment provided no relief.  Pt complains of a sore throat, vomitting x 1 and coughing  Past Medical History:  Diagnosis Date  . B12 deficiency   . CHF (congestive heart failure) ([place])   . Depression   . Hyperlipemia   . Parkinson's disease ([place])   . Prostate cancer ([place])     There are no active problems to display for this patient.   Past Surgical History:  Procedure Laterality Date  . ANKLE SURGERY Right   . APPENDECTOMY    . CATARACT EXTRACTION Bilateral   . PROSTATECTOMY         Home Medications    Prior to Admission medications   Medication Sig Start Date End Date Taking? Authorizing Provider  amiodarone (PACERONE) 200 MG tablet Take 200 mg by mouth daily.    [provider]  amoxicillin-clavulanate (AUGMENTIN) 875-125 MG tablet Take 1 tablet by mouth every 12 (twelve) hours. 05/15/17   Fransico Meadow, PA-C  Ascorbic Acid (VITAMIN C) 1000 MG tablet Take 1,000 mg by mouth daily.    [provider]  aspirin EC 81 MG tablet Take 81 mg by mouth daily.    [provider]  b complex vitamins tablet Take 1 tablet by mouth daily.    [provider]  carbidopa-levodopa (SINEMET IR) 25-100 MG tablet TAKE 1 & 1/2 TABLET BY MOUTH EVERY MORNING TAKE 1 TABLET BY MOUTH THREE TIMES A DAY 03/10/17   Tat, Eustace Quail, DO  clonazePAM (KLONOPIN) 0.5 MG  tablet TAKE 1 TABLET BY MOUTH AT BEDTIME 02/18/17   Phineas Inches, MD  docusate sodium (COLACE) 100 MG capsule Take 100 mg by mouth 2 (two) times daily.    [provider]  lisinopril (PRINIVIL,ZESTRIL) 5 MG tablet Take 5 mg by mouth daily.    [provider]  memantine (NAMENDA) 10 MG tablet Take 10 mg by mouth 2 (two) times daily.    [provider]  mirtazapine (REMERON) 15 MG tablet Take 15 mg by mouth at bedtime.    [provider]  multivitamin-lutein (OCUVITE-LUTEIN) CAPS capsule Take 1 capsule by mouth daily.    [provider]  venlafaxine XR (EFFEXOR-XR) 150 MG 24 hr capsule Take 150 mg by mouth daily with breakfast.    [provider]    Family History No family history on file.  Social History Social History  Substance Use Topics  . Smoking status: Never Smoker  . Smokeless tobacco: Never Used  . Alcohol use No     Allergies   Guanfacine; Phenylephrine; and Suprax [cefixime]   Review of Systems Review of Systems  Respiratory: Negative for shortness of breath.   All other systems reviewed and are negative.    Physical Exam Triage Vital Signs ED Triage Vitals  Enc Vitals Group     BP 05/15/17 1607 111/61  Pulse Rate 05/15/17 1607 74     Resp --      Temp 05/15/17 1607 98 F (36.7 C)     Temp Source 05/15/17 1607 Oral     SpO2 05/15/17 1607 95 %     Weight 05/15/17 1607 170 lb (77.1 kg)     Height 05/15/17 1607 5\' 9"  (1.753 m)     Head Circumference --      Peak Flow --      Pain Score 05/15/17 1608 0     Pain Loc --      Pain Edu? --      Excl. in [place]? --    No data found.   Updated Vital Signs BP 111/61 (BP Location: Left Arm)   Pulse 74   Temp 98 F (36.7 C) (Oral)   Ht 5\' 9"  (1.753 m)   Wt 170 lb (77.1 kg)   SpO2 95%   BMI 25.10 kg/m   Visual Acuity Right Eye Distance:   Left Eye Distance:   Bilateral Distance:    Right Eye Near:   Left Eye Near:    Bilateral Near:      Physical Exam  Constitutional: He appears well-developed and well-nourished.  HENT:  Head: Normocephalic and atraumatic.  Erythema throat,  Eyes: Conjunctivae are normal.  Neck: Neck supple.  Cardiovascular: Normal rate and regular rhythm.   No murmur heard. Pulmonary/Chest: Effort normal and breath sounds normal. No respiratory distress.  Harsh cough  Abdominal: Soft. There is no tenderness.  Musculoskeletal: He exhibits no edema.  Neurological: He is alert.  Skin: Skin is warm and dry.  Psychiatric: He has a normal mood and affect.  Nursing note and vitals reviewed.    UC Treatments / Results  Labs (all labs ordered are listed, but only abnormal results are displayed) Labs Reviewed  POCT RAPID STREP A (OFFICE)    EKG  EKG Interpretation None       Radiology Dg Chest 2 View  Result Date: 05/15/2017 CLINICAL DATA:  Cough, vomiting EXAM: CHEST  2 VIEW COMPARISON:  CTA chest dated 12/18/2014 FINDINGS: Mild patchy right lower lobe opacity, atelectasis versus pneumonia. Mild patchy left lower lobe opacity, possibly atelectasis. Small right pleural effusion.  No frank interstitial edema. The heart is normal in size.  Left subclavian pacemaker. IMPRESSION: Mild patchy right lower lobe opacity, atelectasis versus pneumonia. Small right pleural effusion. Mild patchy left lower lobe opacity, possibly atelectasis. Electronically Signed   By: Julian Hy M.D.   On: 05/15/2017 17:11    Procedures Procedures (including critical care time)  Medications Ordered in UC Medications  ondansetron (ZOFRAN-ODT) disintegrating tablet 4 mg (4 mg Oral Given 05/15/17 1646)     Initial Impression / Assessment and Plan / UC Course  I have reviewed the triage vital signs and the nursing notes.  Pertinent labs & imaging results that were available during my care of the patient were reviewed by me and considered in my medical decision making (see chart for details).     Clinically  symptoms consistent with early pneumonia.  Pt is allergic to cephalosporins and levaquin not given due to qt concerns.  Pt given rx for augmentin.  Due to his age I have advised 24 hour recheck with his MD. Pt understands he needs to go to hospital if any increase in symptoms.  Final Clinical Impressions(s) / UC Diagnoses   Final diagnoses:  Pneumonia due to infectious organism, unspecified laterality, unspecified part of lung  New Prescriptions Discharge Medication List as of 05/15/2017  5:19 PM    START taking these medications   Details  amoxicillin-clavulanate (AUGMENTIN) 875-125 MG tablet Take 1 tablet by mouth every 12 (twelve) hours., Starting Sun 05/15/2017, Print        An After Visit Summary was printed and given to the patient. Controlled Substance Prescriptions [place] Controlled Substance Registry consulted? Not Applicable   Fransico Meadow, [place] 05/15/17 1752

## 2017-05-15 NOTE — ED Triage Notes (Signed)
Patient complaining of sore throat x 1 day, vomiting x 1 day, no diarrhea, no abdominal pain.

## 2017-05-15 NOTE — Discharge Instructions (Signed)
See your Physician for recheck in 2 days.  Return if any problems.  ?

## 2017-07-14 ENCOUNTER — Telehealth: Payer: Self-pay | Admitting: Neurology

## 2017-07-14 MED ORDER — MEMANTINE HCL 10 MG PO TABS: 10.0000 mg | ORAL_TABLET | Freq: Two times a day (BID) | ORAL | 1 refills | Status: DC

## 2017-07-14 NOTE — Telephone Encounter (Signed)
RX sent to pharmacy  

## 2017-07-14 NOTE — Telephone Encounter (Signed)
New Message  Pts brother verbalized he will be out of his medication on Sunday.  *STAT* If patient is at the pharmacy, call can be transferred to refill team.   1. Which medications need to be refilled? (please list name of each medication and dose if known)  memantine (Namenda) 10 mg tablets twice daily  2. Which pharmacy/location (including street and city if local pharmacy) is medication to be sent to? [facility], [address], [address], [place], [place]   3. Do they need a 30 day or 90 day supply? 30 day supply

## 2017-08-08 ENCOUNTER — Ambulatory Visit: Payer: Medicare Other | Admitting: Neurology

## 2017-08-23 ENCOUNTER — Other Ambulatory Visit: Payer: Self-pay | Admitting: Neurology

## 2017-08-25 ENCOUNTER — Other Ambulatory Visit: Payer: Self-pay | Admitting: Neurology

## 2017-08-25 ENCOUNTER — Telehealth: Payer: Self-pay | Admitting: Neurology

## 2017-08-25 NOTE — Telephone Encounter (Signed)
Spoke with Shanon Brow, RN at [facility], who states patient was taken off Aricept on 06/21/17 after discharge from hospital. He could not find why this was d/c. He was calling to see if Dr. Carles Collet wants to restart medication.   I let her know patient does have an appt next week and she would address medications with him at that time, since we have not seen patient since May.   He will also contact patient's daughter to see if she was planning on keeping appt since it is scheduled for Monday. They will call back if they plan on moving appt.   Dr. Carles Collet - fyi.

## 2017-08-25 NOTE — Telephone Encounter (Signed)
Shanon Brow who is a Marine scientist in [place] left a VM message regarding pt and asked for a call back at [phone]

## 2017-08-26 ENCOUNTER — Telehealth: Payer: Self-pay | Admitting: Neurology

## 2017-08-26 MED ORDER — DONEPEZIL HCL 10 MG PO TABS: 10.0000 mg | ORAL_TABLET | Freq: Every day | ORAL | 3 refills | Status: DC
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
## 2017-08-26 NOTE — Telephone Encounter (Signed)
Patient son wants to speak to someone today about his father Behavior. We moved patient to Jan 9 due to the pending weather that we are to be having this weekend at the request of his son. His son feels like he need to be seen sooner than jan I have him on await list. He is really concern about his father  behavior

## 2017-08-26 NOTE — Telephone Encounter (Signed)
Spoke with patient's son. He does want Aricept sent to pharmacy and restarted. [facility] Order sent to [facility] at [phone] to restart medication.   Son is nervous because he knows his dad will not react well to going to a memory care unit. He wants pointers on how to handle this and approach the subject with his dad, as well as information on places that offer this.  Janett Billow- can you please call patient's son to discuss?
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
## 2017-08-29 ENCOUNTER — Ambulatory Visit: Payer: Medicare Other | Admitting: Neurology

## 2017-08-31 ENCOUNTER — Telehealth: Payer: Self-pay | Admitting: Psychology

## 2017-08-31 NOTE — Telephone Encounter (Signed)
Tc to son, [name], to talk about his concerns with memory care for his dad., His concerns are related to logistics of finding the best care for his dad and talking to his father about needing to go to memory care.   I will do some research on memory care facilities that serve the [place] area. In addition,  I will provide resources on choosing memory care for a family member, dealing with aggression/ agitation in dementia and talking with your loved one bout a higher level of care.

## 2017-09-08 ENCOUNTER — Other Ambulatory Visit: Payer: Self-pay | Admitting: Neurology

## 2017-09-08 MED ORDER — MEMANTINE HCL 10 MG PO TABS: 10.0000 mg | ORAL_TABLET | Freq: Two times a day (BID) | ORAL | 0 refills | Status: DC

## 2017-09-14 ENCOUNTER — Other Ambulatory Visit: Payer: Self-pay | Admitting: Neurology

## 2017-09-23 ENCOUNTER — Other Ambulatory Visit: Payer: Self-pay | Admitting: Neurology

## 2017-09-23 NOTE — Telephone Encounter (Signed)
30 day supply only?

## 2017-09-23 NOTE — Telephone Encounter (Signed)
Not seen since May, but has appt next Wednesday. Please advise on refill.
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
## 2017-09-28 ENCOUNTER — Telehealth: Payer: Self-pay | Admitting: Neurology

## 2017-09-28 ENCOUNTER — Ambulatory Visit: Payer: Medicare Other | Admitting: Neurology

## 2017-09-28 MED ORDER — DIPHENHYDRAMINE HCL 25 MG PO CAPS
25.00 | ORAL_CAPSULE | ORAL | Status: DC
Start: ? — End: 2017-09-28

## 2017-09-28 MED ORDER — DEXTROSE-NACL 5-0.9 % IV SOLN
INTRAVENOUS | Status: DC
Start: ? — End: 2017-09-28

## 2017-09-28 MED ORDER — CARBIDOPA-LEVODOPA 25-100 MG PO TABS
1.00 | ORAL_TABLET | ORAL | Status: DC
Start: 2017-09-28 — End: 2017-09-28

## 2017-09-28 MED ORDER — ONDANSETRON HCL 4 MG/2ML IJ SOLN
4.00 | INTRAMUSCULAR | Status: DC
Start: ? — End: 2017-09-28

## 2017-09-28 MED ORDER — HEPARIN SODIUM (PORCINE) 5000 UNIT/ML IJ SOLN
5000.00 | INTRAMUSCULAR | Status: DC
Start: 2017-09-28 — End: 2017-09-28

## 2017-09-28 MED ORDER — ACETAMINOPHEN 500 MG PO TABS
500.00 | ORAL_TABLET | ORAL | Status: DC
Start: ? — End: 2017-09-28

## 2017-09-28 MED ORDER — ACETAMINOPHEN 325 MG PO TABS
650.00 | ORAL_TABLET | ORAL | Status: DC
Start: ? — End: 2017-09-28

## 2017-09-28 MED ORDER — HYDRALAZINE HCL 20 MG/ML IJ SOLN
10.00 | INTRAMUSCULAR | Status: DC
Start: ? — End: 2017-09-28

## 2017-09-28 MED ORDER — MIRTAZAPINE 15 MG PO TABS
15.00 | ORAL_TABLET | ORAL | Status: DC
Start: 2017-09-28 — End: 2017-09-28

## 2017-09-28 MED ORDER — FUROSEMIDE 20 MG PO TABS
20.00 | ORAL_TABLET | ORAL | Status: DC
Start: 2017-09-29 — End: 2017-09-28

## 2017-09-28 MED ORDER — AMIODARONE HCL 200 MG PO TABS
200.00 | ORAL_TABLET | ORAL | Status: DC
Start: 2017-09-29 — End: 2017-09-28

## 2017-09-28 MED ORDER — DOCUSATE SODIUM 100 MG PO CAPS
100.00 | ORAL_CAPSULE | ORAL | Status: DC
Start: 2017-09-28 — End: 2017-09-28

## 2017-09-28 MED ORDER — LISINOPRIL 5 MG PO TABS
5.00 | ORAL_TABLET | ORAL | Status: DC
Start: 2017-09-29 — End: 2017-09-28

## 2017-09-28 MED ORDER — CARBIDOPA-LEVODOPA 25-100 MG PO TABS
1.50 | ORAL_TABLET | ORAL | Status: DC
Start: 2017-09-29 — End: 2017-09-28

## 2017-09-28 MED ORDER — FAMOTIDINE 20 MG PO TABS
20.00 | ORAL_TABLET | ORAL | Status: DC
Start: 2017-09-28 — End: 2017-09-28

## 2017-09-28 MED ORDER — MORPHINE SULFATE 4 MG/ML IJ SOLN
2.00 | INTRAMUSCULAR | Status: DC
Start: ? — End: 2017-09-28

## 2017-09-28 MED ORDER — CLONAZEPAM 1 MG PO TABS
0.50 | ORAL_TABLET | ORAL | Status: DC
Start: 2017-09-28 — End: 2017-09-28

## 2017-09-28 MED ORDER — VENLAFAXINE HCL ER 150 MG PO CP24
150.00 | ORAL_CAPSULE | ORAL | Status: DC
Start: 2017-09-29 — End: 2017-09-28

## 2017-09-28 MED ORDER — VITAMIN C 500 MG PO TABS
500.00 | ORAL_TABLET | ORAL | Status: DC
Start: 2017-09-28 — End: 2017-09-28

## 2017-09-28 MED ORDER — MEMANTINE HCL 10 MG PO TABS
10.00 | ORAL_TABLET | ORAL | Status: DC
Start: 2017-09-28 — End: 2017-09-28

## 2017-09-28 MED ORDER — SODIUM CHLORIDE (HYPERTONIC) 5 % OP SOLN
1.00 | OPHTHALMIC | Status: DC
Start: 2017-09-28 — End: 2017-09-28

## 2017-09-28 MED ORDER — ASPIRIN EC 81 MG PO TBEC
81.00 | DELAYED_RELEASE_TABLET | ORAL | Status: DC
Start: 2017-09-29 — End: 2017-09-28

## 2017-09-28 MED ORDER — DONEPEZIL HCL 10 MG PO TABS
10.00 | ORAL_TABLET | ORAL | Status: DC
Start: 2017-09-28 — End: 2017-09-28

## 2017-09-28 NOTE — Telephone Encounter (Signed)
-----   Message from [name] sent at 09/28/2017  9:03 AM EST ----- Pt will not make appointment today, he is in [facility], they think he had a heart attack/Eric Hendricks

## 2017-09-29 ENCOUNTER — Telehealth: Payer: Self-pay | Admitting: Neurology
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
## 2017-11-30 ENCOUNTER — Encounter: Payer: Self-pay | Admitting: Psychology

## 2017-11-30 ENCOUNTER — Ambulatory Visit (INDEPENDENT_AMBULATORY_CARE_PROVIDER_SITE_OTHER): Payer: Medicare Other | Admitting: Neurology

## 2017-11-30 ENCOUNTER — Encounter: Payer: Self-pay | Admitting: Neurology

## 2017-11-30 VITALS — BP 110/64 | HR 74 | Ht 69.0 in | Wt 166.0 lb

## 2017-11-30 DIAGNOSIS — G20 Parkinson's disease: Secondary | ICD-10-CM

## 2017-11-30 DIAGNOSIS — F0280 Dementia in other diseases classified elsewhere without behavioral disturbance: Secondary | ICD-10-CM | POA: Diagnosis not present

## 2017-11-30 NOTE — Patient Instructions (Signed)
Dr. Casimiro Needle [phone]

## 2017-11-30 NOTE — Progress Notes (Signed)
I met with the patient and his son while they were in the clinic today.  We talked about resources as the patient's dementia is not compatible with him being safe at home or his independent living facility by himself during the day.  We talked about alternatives like adult daycare.  Also we talked about assisted living and memory care.  The patient participated in the conversation and vocalized some understanding.  He shared what was important to him such as living in [place] and being close to his church as his priorities.  Transportation would be a barrier for the family to get patient to an adult day center.The independent living community that he lives that now has a another location in [place] that has a memory care unit. I gave the patient's son a contact at the [place] for their caregiver support program.  In addition, I emailed him the below resources as I did not have them in the office at the time that he was in the clinic and I wanted to make sure that we provided services for the [place] since that is important to the patient and to the family. I provided my contact information to the patient's son so he can contact me with any questions, concerns or challenges that he has with navigating some of these resources.  Assisted Living/ Memory Care:   [facility] a [facility] care unit  which may be the best option for  your father as he wants to continue to live in [place] and wants opportunity to attend his church. He is currently living at [facility] which is  the same company and a few miles away from where he currently lives.   [email or web address]://[email or web address]-types/[email or web address]  List of other Assisted Living facilities in [place]:  http://hood.com/  Adult DayCare:  I did not find any adult day places in [place], but below is the link to two adult day  programs in [place].   [facility] [place], [place] [address] [phone] (fax)  [facility] [place], [place] [address] [phone] (fax)  VA Resources on [place] are part of the [place]. All enrolled Veterans are eligible for these services. However, to get the service you must have a clinical need for it, and the service must be available in your location. Services in the [place] Standard Benefits Package include: Geriatric Evaluation to assess your care needs and to create a care plan, Adult Day Health Care, Respite Care and [place].  Some Home and [employer] may be prioritized based on your level of VA service-connected disability. Nursing Home and Residential Settings have different eligibility requirements for each setting. The VA does not pay for room and board in residential settings such as Assisted Living or Adult Family Homes. However, you may receive some Home and [employer] while you are living in a residential setting. The VA will provide [facility] or community nursing home care IF you meet certain eligibility criteria involving your service connected status, level of disability, and income. If you still have questions after reading this section of the Guide to [facility], call toll-free, 1-877-222-VETS [phone]), Monday through Friday, 7 a.m. to 7 p.m. (CST). Or, contact your [place] social worker. [email or web address].co.uk.asp#

## 2018-01-03 ENCOUNTER — Other Ambulatory Visit: Payer: Self-pay | Admitting: Neurology

## 2018-01-03 ENCOUNTER — Ambulatory Visit: Payer: Medicare Other | Admitting: Neurology

## 2018-01-14 ENCOUNTER — Other Ambulatory Visit: Payer: Self-pay

## 2018-01-14 ENCOUNTER — Emergency Department (INDEPENDENT_AMBULATORY_CARE_PROVIDER_SITE_OTHER)
Admission: EM | Admit: 2018-01-14 | Discharge: 2018-01-14 | Disposition: A | Discharge status: Home / Self Care | Payer: Medicare Other | Source: Home / Self Care | Attending: Family Medicine | Admitting: Family Medicine

## 2018-01-14 ENCOUNTER — Emergency Department (INDEPENDENT_AMBULATORY_CARE_PROVIDER_SITE_OTHER): Payer: Medicare Other

## 2018-01-14 DIAGNOSIS — J069 Acute upper respiratory infection, unspecified: Secondary | ICD-10-CM

## 2018-01-14 DIAGNOSIS — R05 Cough: Secondary | ICD-10-CM

## 2018-01-14 DIAGNOSIS — R0989 Other specified symptoms and signs involving the circulatory and respiratory systems: Secondary | ICD-10-CM

## 2018-01-14 DIAGNOSIS — R059 Cough, unspecified: Secondary | ICD-10-CM

## 2018-01-14 HISTORY — DX: Unspecified dementia, unspecified severity, without behavioral disturbance, psychotic disturbance, mood disturbance, and anxiety: F03.90

## 2018-01-14 MED ORDER — BENZONATATE 100 MG PO CAPS: 100.0000 mg | ORAL_CAPSULE | Freq: Three times a day (TID) | ORAL | 0 refills | Status: DC

## 2018-01-14 MED ORDER — AZITHROMYCIN 250 MG PO TABS: 250.0000 mg | ORAL_TABLET | Freq: Every day | ORAL | 0 refills | Status: DC

## 2018-01-14 NOTE — ED Provider Notes (Signed)
[name]    CSN: [id number] Arrival date & time: 01/14/18  1343     History   Chief Complaint Chief Complaint  Patient presents with  . Cough    HPI Eric Hendricks is a 84 y.o. male.   HPI Eric Hendricks is a 84 y.o. male presenting to UC with his daughter with concern for gradually worsening cough that started 1 week ago. Cough has become productive with green-yellow sputum the last two days.  Mild SOB when he has a coughing episode.  Denies fever, chills, n/v/d. Hx of pneumonia in 04/2017.  He currently lives at a nursing facility so he is unsure if he has been around others who have been sick.     Past Medical History:  Diagnosis Date  . B12 deficiency   . CHF (congestive heart failure) ([place])   . Dementia   . Depression   . Hyperlipemia   . Parkinson's disease ([place])   . Prostate cancer ([place])     There are no active problems to display for this patient.   Past Surgical History:  Procedure Laterality Date  . ANKLE SURGERY Right   . APPENDECTOMY    . CATARACT EXTRACTION Bilateral   . PROSTATECTOMY         Home Medications    Prior to Admission medications   Medication Sig Start Date End Date Taking? Authorizing Provider  Ascorbic Acid (VITAMIN C) 1000 MG tablet Take 1,000 mg by mouth daily.    [provider]  aspirin EC 81 MG tablet Take 81 mg by mouth daily.    [provider]  azithromycin (ZITHROMAX) 250 MG tablet Take 1 tablet (250 mg total) by mouth daily. Take first 2 tablets together, then 1 every day until finished. 01/14/18   Noe Gens, PA-C  b complex vitamins tablet Take 1 tablet by mouth daily.    [provider]  benzonatate (TESSALON) 100 MG capsule Take 1-2 capsules (100-200 mg total) by mouth every 8 (eight) hours. 01/14/18   Noe Gens, PA-C  carbidopa-levodopa (SINEMET IR) 25-100 MG tablet TAKE 1 & 1/2 TABLET BY MOUTH EVERY MORNING TAKE 1 TABLET BY MOUTH THREE TIMES A DAY 03/10/17   Tat, Eustace Quail,  DO  docusate sodium (COLACE) 100 MG capsule Take 100 mg by mouth 2 (two) times daily.    [provider]  donepezil (ARICEPT) 10 MG tablet Take 1 tablet (10 mg total) by mouth at bedtime. 08/26/17   Tat, Eustace Quail, DO  donepezil (ARICEPT) 10 MG tablet TAKE 1 TABLET BY MOUTH AT BEDTIME 01/03/18   Tat, Eustace Quail, DO  lisinopril (PRINIVIL,ZESTRIL) 5 MG tablet Take 5 mg by mouth daily.    [provider]  meloxicam (MOBIC) 7.5 MG tablet TAKE 1 TABLET BY MOUTH ONCE DAILY 09/14/17   Tat, Eustace Quail, DO  memantine (NAMENDA) 10 MG tablet TAKE 1 TABLET BY MOUTH TWICE A DAY 09/14/17   Tat, Eustace Quail, DO  metoprolol succinate (TOPROL-XL) 25 MG 24 hr tablet Take 25 mg by mouth daily. 11/03/17   [provider]  mirtazapine (REMERON) 15 MG tablet Take 15 mg by mouth at bedtime.    [provider]  multivitamin-lutein (OCUVITE-LUTEIN) CAPS capsule Take 1 capsule by mouth daily.    [provider]  ranitidine (ZANTAC) 150 MG tablet Take 150 mg by mouth 2 (two) times daily. 11/05/17   [provider]  venlafaxine XR (EFFEXOR-XR) 150 MG 24 hr capsule Take 150 mg  by mouth daily with breakfast.    [provider]    Family History History reviewed. No pertinent family history.  Social History Social History   Tobacco Use  . Smoking status: Never Smoker  . Smokeless tobacco: Never Used  Substance Use Topics  . Alcohol use: No    Alcohol/week: 0.0 oz  . Drug use: No     Allergies   Guanfacine; Phenylephrine; and Suprax [cefixime]   Review of Systems Review of Systems  Constitutional: Positive for fatigue. Negative for chills and fever.  HENT: Positive for congestion. Negative for ear pain, sore throat, trouble swallowing and voice change.   Respiratory: Positive for cough and shortness of breath.   Cardiovascular: Negative for chest pain and palpitations.  Gastrointestinal: Negative for abdominal pain, diarrhea, nausea and vomiting.    Musculoskeletal: Negative for arthralgias, back pain and myalgias.  Skin: Negative for rash.     Physical Exam Triage Vital Signs ED Triage Vitals  Enc Vitals Group     BP 01/14/18 1414 116/65     Pulse --      Resp 01/14/18 1414 18     Temp 01/14/18 1414 97.6 F (36.4 C)     Temp Source 01/14/18 1414 Oral     SpO2 01/14/18 1414 95 %     Weight 01/14/18 1415 151 lb (68.5 kg)     Height 01/14/18 1415 5\' 9"  (1.753 m)     Head Circumference --      Peak Flow --      Pain Score 01/14/18 1415 0     Pain Loc --      Pain Edu? --      Excl. in [place]? --    No data found.  Updated Vital Signs BP 116/65 (BP Location: Right Arm)   Temp 97.6 F (36.4 C) (Oral)   Resp 18   Ht 5\' 9"  (1.753 m)   Wt 151 lb (68.5 kg)   SpO2 95%   BMI 22.30 kg/m  :     Physical Exam  Constitutional: He is oriented to person, place, and time. He appears well-developed and well-nourished. No distress.  HENT:  Head: Normocephalic and atraumatic.  Eyes: EOM are normal.  Neck: Normal range of motion. Neck supple.  Cardiovascular: Normal rate and regular rhythm.  Pulmonary/Chest: Effort normal. No stridor. No respiratory distress. He has decreased breath sounds in the right lower field and the left lower field. He has no wheezes. He has rhonchi in the right lower field and the left lower field. He has rales.  Musculoskeletal: Normal range of motion.  Neurological: He is alert and oriented to person, place, and time.  Skin: Skin is warm and dry. He is not diaphoretic.  Psychiatric: He has a normal mood and affect. His behavior is normal.  Nursing note and vitals reviewed.    UC Treatments / Results  Labs (all labs ordered are listed, but only abnormal results are displayed) Labs Reviewed - No data to display  EKG None Radiology Dg Chest 2 View  Result Date: 01/14/2018 CLINICAL DATA:  Pt c/o cough with congestion x 1 week. Hx of CHF and prostate ca. EXAM: CHEST - 2 VIEW COMPARISON:  09/27/2017  FINDINGS: Cardiac silhouette is normal in size. Stable left anterior chest wall pacemaker. No mediastinal or hilar masses. No evidence of adenopathy. Prominent interstitial and bronchovascular markings in the lung bases, also unchanged. No evidence of pneumonia or pulmonary edema. No pleural effusion or pneumothorax. Skeletal structures  are demineralized, but intact. IMPRESSION: No active cardiopulmonary disease. Electronically Signed   By: Lajean Manes M.D.   On: 01/14/2018 15:17    Procedures Procedures (including critical care time)  Medications Ordered in UC Medications - No data to display   Initial Impression / Assessment and Plan / UC Course  I have reviewed the triage vital signs and the nursing notes.  Pertinent labs & imaging results that were available during my care of the patient were reviewed by me and considered in my medical decision making (see chart for details).     CXR: no evidence of pneumonia or worsening CHF  Due to hx of pneumonia, worsening cough, and pt living in a care facility, will start on Azithromycin for atypical bacteria Pt may try tessalon for cough as well Encouraged f/u with PCP in 4-5 days if not improving, sooner if worsening.   Final Clinical Impressions(s) / UC Diagnoses   Final diagnoses:  Upper respiratory tract infection, unspecified type  Cough    ED Discharge Orders        Ordered    azithromycin (ZITHROMAX) 250 MG tablet  Daily,   Status:  Discontinued     01/14/18 1534    benzonatate (TESSALON) 100 MG capsule  Every 8 hours,   Status:  Discontinued     01/14/18 1534    azithromycin (ZITHROMAX) 250 MG tablet  Daily     01/14/18 1535    benzonatate (TESSALON) 100 MG capsule  Every 8 hours     01/14/18 1535       Controlled Substance Prescriptions [place] Controlled Substance Registry consulted? Not Applicable   Eric Hendricks 01/14/18 1542

## 2018-01-14 NOTE — ED Triage Notes (Signed)
Pt c/o cough x 1 week. Sometimes productive with green sputum.

## 2018-03-01 ENCOUNTER — Other Ambulatory Visit: Payer: Self-pay | Admitting: Neurology
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
## 2018-06-02 ENCOUNTER — Ambulatory Visit (INDEPENDENT_AMBULATORY_CARE_PROVIDER_SITE_OTHER): Payer: Medicare Other | Admitting: Neurology

## 2018-06-02 ENCOUNTER — Encounter: Payer: Self-pay | Admitting: Neurology

## 2018-06-02 VITALS — BP 100/70 | HR 96 | Ht 69.0 in | Wt 168.4 lb

## 2018-06-02 DIAGNOSIS — G20 Parkinson's disease: Secondary | ICD-10-CM | POA: Diagnosis not present

## 2018-06-02 DIAGNOSIS — F0280 Dementia in other diseases classified elsewhere without behavioral disturbance: Secondary | ICD-10-CM | POA: Diagnosis not present

## 2018-06-02 NOTE — Patient Instructions (Addendum)
1.  You need to follow up with your primary care physician about your shortness of breath 2.  No changes in your parkinsons medications 3.  Ask your [name], MD if you still need the metoprolol.  Your blood pressure was a little low today.

## 2018-07-17 ENCOUNTER — Other Ambulatory Visit: Payer: Self-pay | Admitting: Neurology

## 2018-09-26 ENCOUNTER — Other Ambulatory Visit: Payer: Self-pay | Admitting: Neurology

## 2018-10-13 ENCOUNTER — Other Ambulatory Visit: Payer: Self-pay | Admitting: Neurology

## 2018-11-06 ENCOUNTER — Other Ambulatory Visit: Payer: Self-pay | Admitting: Neurology
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
## 2018-12-01 ENCOUNTER — Ambulatory Visit (INDEPENDENT_AMBULATORY_CARE_PROVIDER_SITE_OTHER): Payer: Medicare Other | Admitting: Neurology

## 2018-12-01 ENCOUNTER — Other Ambulatory Visit: Payer: Self-pay

## 2018-12-01 ENCOUNTER — Encounter: Payer: Self-pay | Admitting: Neurology

## 2018-12-01 VITALS — BP 120/80 | HR 91 | Temp 98.5°F | Ht 69.0 in | Wt 166.0 lb

## 2018-12-01 DIAGNOSIS — G20 Parkinson's disease: Secondary | ICD-10-CM

## 2018-12-01 DIAGNOSIS — F0280 Dementia in other diseases classified elsewhere without behavioral disturbance: Secondary | ICD-10-CM

## 2018-12-01 NOTE — Patient Instructions (Signed)
Let me know if you need prescriptions and let me know if you need a RX for physical therapy.

## 2019-01-29 ENCOUNTER — Other Ambulatory Visit: Payer: Self-pay | Admitting: Neurology

## 2019-05-02 ENCOUNTER — Other Ambulatory Visit: Payer: Self-pay | Admitting: Neurology

## 2019-05-02 NOTE — Telephone Encounter (Signed)
Requested Prescriptions   Pending Prescriptions Disp Refills  . carbidopa-levodopa (SINEMET IR) 25-100 MG tablet [Pharmacy Med Name: CARBID-LEVOD 25-100 TAB] 128 tablet 11    Sig: TAKE 1 & 1/2 TABLET BY MOUTH EVERY MORNING TAKE 1 TABLET BY MOUTH THREE TIMES A DAY  . donepezil (ARICEPT) 10 MG tablet [Pharmacy Med Name: DONEPEZIL HCL 10 MG TABLET] 28 tablet 11    Sig: TAKE 1 TABLET BY MOUTH AT BEDTIME  . memantine (NAMENDA) 10 MG tablet [Pharmacy Med Name: MEMANTINE HCL 10 MG TABLET*] 56 tablet 11    Sig: TAKE 1 TABLET BY MOUTH TWICE A DAY   Rx last filled:01/29/19 #135 3 refills 01/29/19 #30 3 refills 01/29/19 #60 3 refils    Pt last seen:12/01/18  Follow up appt scheduled:06/11/19
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
## 2019-06-11 ENCOUNTER — Ambulatory Visit (INDEPENDENT_AMBULATORY_CARE_PROVIDER_SITE_OTHER): Payer: Medicare Other | Admitting: Neurology

## 2019-06-11 ENCOUNTER — Encounter: Payer: Self-pay | Admitting: Neurology

## 2019-06-11 ENCOUNTER — Other Ambulatory Visit: Payer: Self-pay

## 2019-06-11 VITALS — BP 123/67 | HR 81 | Ht 68.0 in | Wt 160.6 lb

## 2019-06-11 DIAGNOSIS — F02818 Dementia in other diseases classified elsewhere, unspecified severity, with other behavioral disturbance: Secondary | ICD-10-CM

## 2019-06-11 DIAGNOSIS — F0281 Dementia in other diseases classified elsewhere with behavioral disturbance: Secondary | ICD-10-CM | POA: Diagnosis not present

## 2019-06-11 DIAGNOSIS — G20 Parkinson's disease: Secondary | ICD-10-CM

## 2019-06-11 MED ORDER — DIVALPROEX SODIUM 250 MG PO DR TAB: 250.0000 mg | DELAYED_RELEASE_TABLET | Freq: Two times a day (BID) | ORAL | 1 refills | Status: DC

## 2019-06-11 NOTE — Patient Instructions (Signed)
1.  Start depakote 250 mg - 1 twice per day.  This is for behavior and mood.   2.  Get me a list of medications.  The physicians and staff at [facility] are committed to providing excellent care. You may receive a survey requesting feedback about your experience at our office. We strive to receive "very good" responses to the survey questions. If you feel that your experience would prevent you from giving the office a "very good " response, please contact our office to try to remedy the situation. We may be reached at [phone]. Thank you for taking the time out of your busy day to complete the survey.

## 2019-06-14 ENCOUNTER — Telehealth: Payer: Self-pay | Admitting: Neurology

## 2019-06-14 NOTE — Telephone Encounter (Signed)
Pt son called back he called facility they send him a list of his medication. Which was 3 pages on the same list. Pt son states that he gets a Rx purchase list from the pharmacy with the medication that are purchased for patient from the facility. The medications that provider prescribed if on the purchase list. He thinks that maybe the facility fax the incorrect list to the office.   He say's Hopefully this help. He would like to continue care with Dr. Carles Collet

## 2019-06-14 NOTE — Telephone Encounter (Signed)
I was able to finally get a copy of his medication list from his [facility] facility.  Patient is apparently no longer on carbidopa/levodopa.  I do not know how long he has been off of this medication, as they have not had the medication list for the last 2 visits.  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  He is also off of Aricept.  Again I do not know who took him off of this.  He was only on memantine, 10 mg twice per day from me.  [place], please let the patient's son know that the facility has taken him off most of the meds that I have prescribed.  Because of that, I would really like the facility to take over his full care.  There may be a good reason for those things, but I am unaware if so. For the last several visits, I have not had a medication list updated, so was not able to treat him with the full knowledge.  I just started Depakote on him, and that is not on the list that was just faxed either.  The list was printed on June 12, 2019 and I prescribed Depakote on June 11, 2019, so it is unclear if this just did not make it to the list yet, or if they decided not to give it.

## 2019-06-14 NOTE — Telephone Encounter (Signed)
Then please have the son send [place] an accurate list.  Thank you

## 2019-06-14 NOTE — Telephone Encounter (Signed)
Called patient son Eric Hendricks he was asleep his wife states that she will have him call office back when wakes up.

## 2019-06-14 NOTE — Telephone Encounter (Signed)
Pt son called back he was informed of provider response. He states that he will try to get him moved to a assistant living facility. He was not aware of the facility changing his medications.

## 2019-06-14 NOTE — Telephone Encounter (Signed)
Pt son request to keep care with Dr. Carles Collet He apologizes and states he did not know that they was not given his medication prescribed by Dr. Carles Collet

## 2019-06-14 NOTE — Telephone Encounter (Signed)
Pt son does not have a accurate list he requested a list from our clinic. They also sent him a incorrect list

## 2019-06-20 ENCOUNTER — Telehealth: Payer: Self-pay | Admitting: Neurology

## 2019-06-20 NOTE — Telephone Encounter (Signed)
Patient's son is calling back to speak with you. Please call. Thank you

## 2019-06-20 NOTE — Telephone Encounter (Signed)
Spoke with patient son he has question regarding why dr. Carles Collet requested patient to be in assistant living facility? Pt/ Son is looking into moving him to another facility they have look/ toured [facility] and [facility].  Pt son was given provider response from last office.   -lives in independent living but there is med management qid.  Pt does dressing/bathing.  Discussed again as I have other visits that he needs higher level of care.  Pt should not be living independently.    He understands an is aware. He will also be having facility send over most resent medication list. ([facility] in [place])

## 2019-06-22 NOTE — Telephone Encounter (Signed)
I have received an updated list of patient's medications, this time it does include carbidopa/levodopa 25/100, 1-1/2 tablets in the morning at 8 AM, followed by 1 tablet at noon, 1 tablet at 5:30 PM and 1 tablet at 9 PM.  He is on Depakote, 250 mg twice per day.  He is on Aricept, 10 mg daily.  He is on mirtazapine, 15 mg daily.  He is on Namenda, 10 mg twice per day.  [place], let son know that I got updated med list and it does include meds I give him.  Thanks.

## 2019-06-22 NOTE — Telephone Encounter (Signed)
[place] spoke with son he was made aware of this.

## 2019-08-08 ENCOUNTER — Telehealth: Payer: Self-pay | Admitting: Neurology

## 2019-08-08 MED ORDER — DIVALPROEX SODIUM 250 MG PO DR TAB: 250.0000 mg | DELAYED_RELEASE_TABLET | Freq: Two times a day (BID) | ORAL | 1 refills | Status: DC

## 2019-08-08 NOTE — Telephone Encounter (Signed)
Depakote rx sent to pharmacy.

## 2019-08-08 NOTE — Telephone Encounter (Signed)
Colletta [place] called from [facility]. She said that this patient has been moved that facility from [facility]. She is needing a New order for Depakote faxed to [facility]. The # is [id number]. He has been without the medication for 1 month. The Depakote medication was not on his med list? Thank you

## 2019-11-13 ENCOUNTER — Ambulatory Visit: Payer: Medicare Other | Admitting: Neurology

## 2019-11-21 ENCOUNTER — Emergency Department: Payer: Medicare Other

## 2019-11-21 ENCOUNTER — Other Ambulatory Visit: Payer: Self-pay

## 2019-11-21 ENCOUNTER — Inpatient Hospital Stay
Admission: EM | Admit: 2019-11-21 | Discharge: 2019-11-23 | DRG: 291 | Disposition: A | Discharge status: Home Health | Payer: Medicare Other | Attending: Internal Medicine | Admitting: Internal Medicine

## 2019-11-21 ENCOUNTER — Encounter: Payer: Self-pay | Admitting: Emergency Medicine

## 2019-11-21 DIAGNOSIS — Z8042 Family history of malignant neoplasm of prostate: Secondary | ICD-10-CM

## 2019-11-21 DIAGNOSIS — Z825 Family history of asthma and other chronic lower respiratory diseases: Secondary | ICD-10-CM

## 2019-11-21 DIAGNOSIS — Z7982 Long term (current) use of aspirin: Secondary | ICD-10-CM | POA: Diagnosis not present

## 2019-11-21 DIAGNOSIS — F0280 Dementia in other diseases classified elsewhere without behavioral disturbance: Secondary | ICD-10-CM | POA: Diagnosis present

## 2019-11-21 DIAGNOSIS — Z800 Family history of malignant neoplasm of digestive organs: Secondary | ICD-10-CM | POA: Diagnosis not present

## 2019-11-21 DIAGNOSIS — G20 Parkinson's disease: Secondary | ICD-10-CM | POA: Diagnosis present

## 2019-11-21 DIAGNOSIS — I5023 Acute on chronic systolic (congestive) heart failure: Secondary | ICD-10-CM | POA: Diagnosis present

## 2019-11-21 DIAGNOSIS — J9601 Acute respiratory failure with hypoxia: Secondary | ICD-10-CM | POA: Diagnosis present

## 2019-11-21 DIAGNOSIS — Z950 Presence of cardiac pacemaker: Secondary | ICD-10-CM | POA: Diagnosis not present

## 2019-11-21 DIAGNOSIS — Z801 Family history of malignant neoplasm of trachea, bronchus and lung: Secondary | ICD-10-CM

## 2019-11-21 DIAGNOSIS — J40 Bronchitis, not specified as acute or chronic: Secondary | ICD-10-CM | POA: Diagnosis not present

## 2019-11-21 DIAGNOSIS — Z886 Allergy status to analgesic agent status: Secondary | ICD-10-CM

## 2019-11-21 DIAGNOSIS — Z791 Long term (current) use of non-steroidal anti-inflammatories (NSAID): Secondary | ICD-10-CM

## 2019-11-21 DIAGNOSIS — E785 Hyperlipidemia, unspecified: Secondary | ICD-10-CM | POA: Diagnosis present

## 2019-11-21 DIAGNOSIS — I480 Paroxysmal atrial fibrillation: Secondary | ICD-10-CM | POA: Diagnosis present

## 2019-11-21 DIAGNOSIS — Z833 Family history of diabetes mellitus: Secondary | ICD-10-CM

## 2019-11-21 DIAGNOSIS — F319 Bipolar disorder, unspecified: Secondary | ICD-10-CM | POA: Diagnosis present

## 2019-11-21 DIAGNOSIS — Z888 Allergy status to other drugs, medicaments and biological substances status: Secondary | ICD-10-CM | POA: Diagnosis not present

## 2019-11-21 DIAGNOSIS — Z79899 Other long term (current) drug therapy: Secondary | ICD-10-CM | POA: Diagnosis not present

## 2019-11-21 DIAGNOSIS — E538 Deficiency of other specified B group vitamins: Secondary | ICD-10-CM | POA: Diagnosis present

## 2019-11-21 DIAGNOSIS — F329 Major depressive disorder, single episode, unspecified: Secondary | ICD-10-CM | POA: Diagnosis present

## 2019-11-21 DIAGNOSIS — Z8546 Personal history of malignant neoplasm of prostate: Secondary | ICD-10-CM

## 2019-11-21 DIAGNOSIS — I25118 Atherosclerotic heart disease of native coronary artery with other forms of angina pectoris: Secondary | ICD-10-CM | POA: Diagnosis not present

## 2019-11-21 DIAGNOSIS — R0602 Shortness of breath: Secondary | ICD-10-CM | POA: Diagnosis not present

## 2019-11-21 DIAGNOSIS — I2510 Atherosclerotic heart disease of native coronary artery without angina pectoris: Secondary | ICD-10-CM | POA: Diagnosis present

## 2019-11-21 DIAGNOSIS — F32A Depression, unspecified: Secondary | ICD-10-CM | POA: Diagnosis present

## 2019-11-21 DIAGNOSIS — Z955 Presence of coronary angioplasty implant and graft: Secondary | ICD-10-CM

## 2019-11-21 DIAGNOSIS — Z8249 Family history of ischemic heart disease and other diseases of the circulatory system: Secondary | ICD-10-CM | POA: Diagnosis not present

## 2019-11-21 DIAGNOSIS — I110 Hypertensive heart disease with heart failure: Secondary | ICD-10-CM | POA: Diagnosis present

## 2019-11-21 DIAGNOSIS — I5021 Acute systolic (congestive) heart failure: Secondary | ICD-10-CM | POA: Diagnosis not present

## 2019-11-21 DIAGNOSIS — J209 Acute bronchitis, unspecified: Secondary | ICD-10-CM | POA: Diagnosis present

## 2019-11-21 DIAGNOSIS — Z820 Family history of epilepsy and other diseases of the nervous system: Secondary | ICD-10-CM | POA: Diagnosis not present

## 2019-11-21 DIAGNOSIS — Z881 Allergy status to other antibiotic agents status: Secondary | ICD-10-CM | POA: Diagnosis not present

## 2019-11-21 DIAGNOSIS — Z20822 Contact with and (suspected) exposure to covid-19: Secondary | ICD-10-CM | POA: Diagnosis present

## 2019-11-21 LAB — CBC WITH DIFFERENTIAL/PLATELET
Abs Immature Granulocytes: 0.02 10*3/uL (ref 0.00–0.07)
Basophils Absolute: 0 10*3/uL (ref 0.0–0.1)
Basophils Relative: 1 %
Eosinophils Absolute: 0.3 10*3/uL (ref 0.0–0.5)
Eosinophils Relative: 7 %
HCT: 47.4 % (ref 39.0–52.0)
Hemoglobin: 15.4 g/dL (ref 13.0–17.0)
Immature Granulocytes: 0 %
Lymphocytes Relative: 11 %
Lymphs Abs: 0.5 10*3/uL — ABNORMAL LOW (ref 0.7–4.0)
MCH: 32.2 pg (ref 26.0–34.0)
MCHC: 32.5 g/dL (ref 30.0–36.0)
MCV: 99.2 fL (ref 80.0–100.0)
Monocytes Absolute: 0.4 10*3/uL (ref 0.1–1.0)
Monocytes Relative: 8 %
Neutro Abs: 3.4 10*3/uL (ref 1.7–7.7)
Neutrophils Relative %: 73 %
Platelets: 141 10*3/uL — ABNORMAL LOW (ref 150–400)
RBC: 4.78 MIL/uL (ref 4.22–5.81)
RDW: 14.9 % (ref 11.5–15.5)
WBC: 4.6 10*3/uL (ref 4.0–10.5)
nRBC: 0 % (ref 0.0–0.2)

## 2019-11-21 LAB — BASIC METABOLIC PANEL
Anion gap: 12 (ref 5–15)
BUN: 20 mg/dL (ref 8–23)
CO2: 26 mmol/L (ref 22–32)
Calcium: 8.9 mg/dL (ref 8.9–10.3)
Chloride: 105 mmol/L (ref 98–111)
Creatinine, Ser: 1.03 mg/dL (ref 0.61–1.24)
GFR calc Af Amer: >60 mL/min (ref >60)
GFR calc non Af Amer: >60 mL/min (ref >60)
Glucose, Bld: 122 mg/dL — ABNORMAL HIGH (ref 70–99)
Potassium: 4.4 mmol/L (ref 3.5–5.1)
Sodium: 143 mmol/L (ref 135–145)

## 2019-11-21 LAB — RESPIRATORY PANEL BY RT PCR (FLU A&B, COVID)
Influenza A by PCR: NEGATIVE
Influenza B by PCR: NEGATIVE
SARS Coronavirus 2 by RT PCR: NEGATIVE

## 2019-11-21 LAB — TROPONIN I (HIGH SENSITIVITY)
Troponin I (High Sensitivity): 32 ng/L — ABNORMAL HIGH (ref <18)
Troponin I (High Sensitivity): 35 ng/L — ABNORMAL HIGH (ref <18)

## 2019-11-21 LAB — BRAIN NATRIURETIC PEPTIDE: B Natriuretic Peptide: 473 pg/mL — ABNORMAL HIGH (ref 0.0–100.0)

## 2019-11-21 MED ORDER — FAMOTIDINE 20 MG PO TABS
20.0000 mg | ORAL_TABLET | Freq: Every day | ORAL | Status: DC
  Administered 2019-11-22 – 2019-11-23 (×2): 20 mg via ORAL
  Filled 2019-11-21 (×3): qty 1

## 2019-11-21 MED ORDER — SERTRALINE HCL 50 MG PO TABS: 25.0000 mg | ORAL_TABLET | Freq: Every day | ORAL | Status: DC

## 2019-11-21 MED ORDER — AMIODARONE HCL 100 MG PO TABS
100.0000 mg | ORAL_TABLET | Freq: Every day | ORAL | Status: DC
  Administered 2019-11-22 – 2019-11-23 (×2): 100 mg via ORAL
  Filled 2019-11-21 (×2): qty 1

## 2019-11-21 MED ORDER — DIVALPROEX SODIUM 250 MG PO DR TAB
250.0000 mg | DELAYED_RELEASE_TABLET | Freq: Two times a day (BID) | ORAL | Status: DC
  Administered 2019-11-21 – 2019-11-23 (×4): 250 mg via ORAL
  Filled 2019-11-21 (×5): qty 1

## 2019-11-21 MED ORDER — VENLAFAXINE HCL ER 75 MG PO CP24
150.0000 mg | ORAL_CAPSULE | Freq: Every day | ORAL | Status: DC
  Administered 2019-11-22 – 2019-11-23 (×2): 150 mg via ORAL
  Filled 2019-11-21 (×2): qty 2
  Filled 2019-11-21: qty 1

## 2019-11-21 MED ORDER — ONDANSETRON HCL 4 MG/2ML IJ SOLN: 4.0000 mg | Freq: Four times a day (QID) | INTRAMUSCULAR | Status: DC | PRN

## 2019-11-21 MED ORDER — DOCUSATE SODIUM 100 MG PO CAPS
100.0000 mg | ORAL_CAPSULE | Freq: Two times a day (BID) | ORAL | Status: DC
  Administered 2019-11-21 – 2019-11-23 (×4): 100 mg via ORAL
  Filled 2019-11-21 (×4): qty 1

## 2019-11-21 MED ORDER — FUROSEMIDE 10 MG/ML IJ SOLN
40.0000 mg | Freq: Once | INTRAMUSCULAR | Status: AC
  Administered 2019-11-21: 14:00:00 40 mg via INTRAVENOUS
  Filled 2019-11-21: qty 4

## 2019-11-21 MED ORDER — FUROSEMIDE 10 MG/ML IJ SOLN
40.0000 mg | Freq: Two times a day (BID) | INTRAMUSCULAR | Status: DC
  Administered 2019-11-21 – 2019-11-22 (×2): 40 mg via INTRAVENOUS
  Filled 2019-11-21 (×3): qty 4

## 2019-11-21 MED ORDER — CARBIDOPA-LEVODOPA 25-100 MG PO TABS
1.0000 | ORAL_TABLET | Freq: Three times a day (TID) | ORAL | Status: DC
  Administered 2019-11-21 – 2019-11-23 (×6): 1 via ORAL
  Filled 2019-11-21 (×9): qty 1

## 2019-11-21 MED ORDER — DONEPEZIL HCL 5 MG PO TABS
10.0000 mg | ORAL_TABLET | Freq: Every day | ORAL | Status: DC
  Administered 2019-11-21 – 2019-11-22 (×2): 10 mg via ORAL
  Filled 2019-11-21 (×3): qty 2

## 2019-11-21 MED ORDER — RENA-VITE PO TABS
1.0000 | ORAL_TABLET | Freq: Every day | ORAL | Status: DC
  Administered 2019-11-21 – 2019-11-23 (×3): 1 via ORAL
  Filled 2019-11-21 (×3): qty 1

## 2019-11-21 MED ORDER — METOPROLOL SUCCINATE ER 25 MG PO TB24
25.0000 mg | ORAL_TABLET | Freq: Every day | ORAL | Status: DC
  Administered 2019-11-22: 25 mg via ORAL
  Filled 2019-11-21 (×2): qty 1

## 2019-11-21 MED ORDER — SODIUM CHLORIDE 0.9 % IV SOLN: 250.0000 mL | INTRAVENOUS | Status: DC | PRN

## 2019-11-21 MED ORDER — MEMANTINE HCL 10 MG PO TABS
10.0000 mg | ORAL_TABLET | Freq: Two times a day (BID) | ORAL | Status: DC
  Administered 2019-11-21 – 2019-11-23 (×4): 10 mg via ORAL
  Filled 2019-11-21 (×5): qty 1

## 2019-11-21 MED ORDER — LISINOPRIL 5 MG PO TABS
5.0000 mg | ORAL_TABLET | Freq: Every day | ORAL | Status: DC
  Administered 2019-11-22: 5 mg via ORAL
  Filled 2019-11-21 (×2): qty 1

## 2019-11-21 MED ORDER — MIRTAZAPINE 15 MG PO TABS
15.0000 mg | ORAL_TABLET | Freq: Every day | ORAL | Status: DC
  Administered 2019-11-21 – 2019-11-22 (×2): 15 mg via ORAL
  Filled 2019-11-21 (×2): qty 1

## 2019-11-21 MED ORDER — ASPIRIN EC 81 MG PO TBEC
81.0000 mg | DELAYED_RELEASE_TABLET | Freq: Every day | ORAL | Status: DC
  Administered 2019-11-22 – 2019-11-23 (×2): 81 mg via ORAL
  Filled 2019-11-21 (×2): qty 1

## 2019-11-21 MED ORDER — OCUVITE-LUTEIN PO CAPS
1.0000 | ORAL_CAPSULE | Freq: Every day | ORAL | Status: DC
  Administered 2019-11-21 – 2019-11-23 (×3): 1 via ORAL
  Filled 2019-11-21 (×3): qty 1

## 2019-11-21 MED ORDER — ENOXAPARIN SODIUM 40 MG/0.4ML ~~LOC~~ SOLN
40.0000 mg | SUBCUTANEOUS | Status: DC
  Administered 2019-11-21 – 2019-11-22 (×2): 40 mg via SUBCUTANEOUS
  Filled 2019-11-21 (×2): qty 0.4

## 2019-11-21 MED ORDER — ACETAMINOPHEN 325 MG PO TABS
650.0000 mg | ORAL_TABLET | ORAL | Status: DC | PRN
  Administered 2019-11-22 – 2019-11-23 (×2): 650 mg via ORAL
  Filled 2019-11-21 (×2): qty 2

## 2019-11-21 MED ORDER — SODIUM CHLORIDE 0.9% FLUSH
3.0000 mL | Freq: Two times a day (BID) | INTRAVENOUS | Status: DC
  Administered 2019-11-21 – 2019-11-23 (×5): 3 mL via INTRAVENOUS

## 2019-11-21 MED ORDER — SODIUM CHLORIDE 0.9% FLUSH: 3.0000 mL | INTRAVENOUS | Status: DC | PRN

## 2019-11-21 MED ORDER — ATORVASTATIN CALCIUM 80 MG PO TABS
80.0000 mg | ORAL_TABLET | Freq: Every evening | ORAL | Status: DC
  Administered 2019-11-21 – 2019-11-22 (×2): 80 mg via ORAL
  Filled 2019-11-21 (×2): qty 1

## 2019-11-21 MED ORDER — ASCORBIC ACID 500 MG PO TABS
500.0000 mg | ORAL_TABLET | Freq: Three times a day (TID) | ORAL | Status: DC
  Administered 2019-11-21 – 2019-11-23 (×5): 500 mg via ORAL
  Filled 2019-11-21 (×5): qty 1

## 2019-11-21 MED ORDER — LISINOPRIL 5 MG PO TABS: 5.0000 mg | ORAL_TABLET | Freq: Every day | ORAL | Status: DC

## 2019-11-21 NOTE — ED Notes (Signed)
During assessment of pt observed pt's O2 sats to be in low 90's and intermittently would drop into high 80's, placed pt on 2L O2 [place].

## 2019-11-21 NOTE — ED Notes (Signed)
Attempted to call [facility] for background on pt. Left message with nursing staff.

## 2019-11-21 NOTE — ED Provider Notes (Signed)
[facility] Emergency Department Provider Note       Time seen: ----------------------------------------- 12:09 PM on 11/21/2019 -----------------------------------------   I have reviewed the triage vital signs and the nursing notes.  HISTORY   Chief Complaint Cough    HPI Eric Hendricks is a 86 y.o. male with a history of CHF, dementia, depression, hyperlipidemia, Parkinson's disease, prostate cancer who presents to the ED for abnormal lab work.  Patient had an x-ray yesterday and according to the staff she had fluid on his heart.  Patient states he coughed up some green phlegm.  Past Medical History:  Diagnosis Date  . B12 deficiency   . CHF (congestive heart failure) ([place])   . Dementia ([place])   . Depression   . Hyperlipemia   . Parkinson's disease ([place])   . Prostate cancer ([place])     There are no problems to display for this patient.   Past Surgical History:  Procedure Laterality Date  . ANKLE SURGERY Right   . APPENDECTOMY    . CATARACT EXTRACTION Bilateral   . PROSTATECTOMY      Allergies Phenylephrine-guaifenesin, Aspirin, Guaifenesin, Guanfacine, Phenylephrine, and Suprax [cefixime]  Social History Social History   Tobacco Use  . Smoking status: Never Smoker  . Smokeless tobacco: Never Used  Substance Use Topics  . Alcohol use: No    Alcohol/week: 0.0 standard drinks  . Drug use: No    Review of Systems Constitutional: Negative for fever. Cardiovascular: Negative for chest pain. Respiratory: Negative for shortness of breath.  Positive for cough Gastrointestinal: Negative for abdominal pain, vomiting and diarrhea. Musculoskeletal: Negative for back pain. Skin: Negative for rash. Neurological: Negative for headaches, focal weakness or numbness.  All systems negative/normal/unremarkable except as stated in the HPI  ____________________________________________   PHYSICAL EXAM:  VITAL SIGNS: ED Triage Vitals  Enc  Vitals Group     BP      Pulse      Resp      Temp      Temp src      SpO2      Weight      Height      Head Circumference      Peak Flow      Pain Score      Pain Loc      Pain Edu?      Excl. in [place]?    Constitutional: Alert and oriented. Well appearing and in no distress. Eyes: Conjunctivae are normal. Normal extraocular movements. Cardiovascular: Normal rate, regular rhythm. No murmurs, rubs, or gallops. Respiratory: Normal respiratory effort without tachypnea nor retractions. Breath sounds are clear and equal bilaterally. No wheezes/rales/rhonchi. Gastrointestinal: Soft and nontender. Normal bowel sounds Musculoskeletal: Nontender with normal range of motion in extremities. No lower extremity tenderness nor edema. Neurologic:  Normal speech and language. No gross focal neurologic deficits are appreciated.  Skin:  Skin is warm, dry and intact. No rash noted. Psychiatric: Mood and affect are normal. Speech and behavior are normal.  ____________________________________________  EKG: Interpreted by me.  Sinus rhythm rate of 69 bpm, PACs, LVH with repolarization abnormality, long QT  ____________________________________________  ED COURSE:  As part of my medical decision making, I reviewed the following data within the [place] History obtained from family if available, nursing notes, old chart and ekg, as well as notes from prior ED visits. Patient presented for cough, we will assess with labs and imaging as indicated at this time.   Procedures  [name] was evaluated in Emergency Department on 11/21/2019 for the symptoms described in the history of present illness. He was evaluated in the context of the global COVID-19 pandemic, which necessitated consideration that the patient might be at risk for infection with the SARS-CoV-2 virus that causes COVID-19. Institutional protocols and algorithms that pertain to the evaluation of patients at risk for COVID-19  are in a state of rapid change based on information released by regulatory bodies including the CDC and federal and state organizations. These policies and algorithms were followed during the patient's care in the ED.  ____________________________________________   LABS (pertinent positives/negatives)  Labs Reviewed  CBC WITH DIFFERENTIAL/PLATELET - Abnormal; Notable for the following components:      Result Value   Platelets 141 (*)    Lymphs Abs 0.5 (*)    All other components within normal limits  BASIC METABOLIC PANEL - Abnormal; Notable for the following components:   Glucose, Bld 122 (*)    All other components within normal limits  BRAIN NATRIURETIC PEPTIDE - Abnormal; Notable for the following components:   B Natriuretic Peptide 473.0 (*)    All other components within normal limits  TROPONIN I (HIGH SENSITIVITY) - Abnormal; Notable for the following components:   Troponin I (High Sensitivity) 32 (*)    All other components within normal limits  RESPIRATORY PANEL BY RT PCR (FLU A&B, COVID)   CRITICAL CARE Performed by: Laurence Aly   Total critical care time: 30 minutes  Critical care time was exclusive of separately billable procedures and treating other patients.  Critical care was necessary to treat or prevent imminent or life-threatening deterioration.  Critical care was time spent personally by me on the following activities: development of treatment plan with patient and/or surrogate as well as nursing, discussions with consultants, evaluation of patient's response to treatment, examination of patient, obtaining history from patient or surrogate, ordering and performing treatments and interventions, ordering and review of laboratory studies, ordering and review of radiographic studies, pulse oximetry and re-evaluation of patient's condition.  RADIOLOGY Images were viewed by me Chest x-ray IMPRESSION: 1. Small to moderate right-sided pleural effusion.  Possible trace left pleural effusion. 2. Associated bibasilar opacities may reflect a combination of atelectasis and/or pneumonia.  ____________________________________________   DIFFERENTIAL DIAGNOSIS   CHF, COPD, pneumonia, COVID-19  FINAL ASSESSMENT AND PLAN  CHF exacerbation, elevated troponin   Plan: The patient had presented for abnormal outpatient x-ray and possibly lab work. Patient's labs did resemble congestive heart failure with an elevated BNP and slightly elevated troponin. Patient's imaging revealed small to moderate right-sided pleural effusion with trace effusion on the left side.  I have ordered IV Lasix for him.  I will discuss with the hospitalist for admission.   Laurence Aly, MD    Note: This note was generated in part or whole with voice recognition software. Voice recognition is usually quite accurate but there are transcription errors that can and very often do occur. I apologize for any typographical errors that were not detected and corrected.     Earleen Newport, MD 11/21/19 [phone]

## 2019-11-21 NOTE — H&P (Signed)
History and Physical    Eric Hendricks DOB: [birth year] DOA: 11/21/2019  PCP: [name], MD   Patient coming from: SNF  I have personally briefly reviewed patient's old medical records in [place]  Chief Complaint: Shortness of breath  HPI: Eric Hendricks is a 86 y.o. male with a past medical history  significant for Parkinson's disease with dementia, coronary artery disease, chronic systolic heart failure, depression and history of prostate cancer.  He was brought into the emergency room for evaluation of shortness of breath mild to moderate exertion associated with a cough productive of green phlegm.  Denies having any fever chills.  He denies having any chest pain, nausea, vomiting or diaphoresis.  He was said to have had chest x-ray done at the skilled nursing facility which came back abnormal and lab work which was abnormal as well. He was referred to the emergency room for further evaluation  ED Course:  The patient had presented for abnormal outpatient x-ray and possibly lab work.  His labs  revealed an elevated BNP and slightly elevated troponin. Patient's imaging revealed small to moderate right-sided pleural effusion with trace effusion on the left side.  Troponin was slightly elevated he was hypoxic in the emergency room with pulse oximetry between 87 and 88% requiring oxygen supplementation to maintain pulse oximetry greater than 92%  Review of Systems: As per HPI otherwise 10 point review of systems negative.    Past Medical History:  Diagnosis Date  . B12 deficiency   . CHF (congestive heart failure) ([place])   . Dementia ([place])   . Depression   . Hyperlipemia   . Parkinson's disease ([place])   . Prostate cancer [facility])     Past Surgical History:  Procedure Laterality Date  . ANKLE SURGERY Right   . APPENDECTOMY    . CATARACT EXTRACTION Bilateral   . PROSTATECTOMY       reports that he has never smoked. He has never used smokeless tobacco. He reports  that he does not drink alcohol or use drugs.  Allergies  Allergen Reactions  . Phenylephrine-Guaifenesin Palpitations  . Aspirin Other (See Comments)  . Guaifenesin Other (See Comments)    Unknown reaction  . Guanfacine   . Phenylephrine   . Suprax [Cefixime]     Family History  Problem Relation Age of Onset  . Colon cancer Father   . Tremor Mother   . Parkinson's disease Brother   . Prostate cancer Brother   . Lung cancer Sister   . Emphysema Sister   . Heart failure Child   . Diabetes Child   . AAA (abdominal aortic aneurysm) Child      Prior to Admission medications   Medication Sig Start Date End Date Taking? Authorizing Provider  amiodarone (PACERONE) 200 MG tablet Take 100 mg by mouth daily at 6 (six) AM. 11/03/19  Yes [provider]  ascorbic acid (VITAMIN C) 500 MG tablet Take 500 mg by mouth 3 (three) times daily.   Yes [provider]  aspirin EC 81 MG tablet Take 81 mg by mouth daily.   Yes [provider]  atorvastatin (LIPITOR) 80 MG tablet Take 80 mg by mouth every evening. 11/03/19  Yes [provider]  carbidopa-levodopa (SINEMET IR) 25-100 MG tablet TAKE 1 & 1/2 TABLET BY MOUTH EVERY MORNING TAKE 1 TABLET BY MOUTH THREE TIMES A DAY Patient taking differently: Take 1.5 tablets by mouth every morning.  05/02/19  Yes Tat, Eustace Quail, DO  divalproex (DEPAKOTE) 250 MG DR tablet Take 1 tablet (250 mg total) by mouth 2 (two) times daily. 08/08/19  Yes Tat, Eustace Quail, DO  docusate sodium (COLACE) 100 MG capsule Take 100 mg by mouth 2 (two) times daily.   Yes [provider]  donepezil (ARICEPT) 10 MG tablet TAKE 1 TABLET BY MOUTH AT BEDTIME Patient taking differently: Take 10 mg by mouth at bedtime.  05/02/19  Yes Tat, Eustace Quail, DO  famotidine (PEPCID) 20 MG tablet Take 20 mg by mouth 2 (two) times daily.   Yes [provider]  memantine (NAMENDA) 10 MG tablet TAKE 1 TABLET BY MOUTH TWICE A DAY Patient taking  differently: Take 10 mg by mouth 2 (two) times daily.  05/02/19  Yes Tat, Eustace Quail, DO  metoprolol succinate (TOPROL-XL) 25 MG 24 hr tablet Take 25 mg by mouth daily. 11/03/17  Yes [provider]  mirtazapine (REMERON) 15 MG tablet Take 15 mg by mouth at bedtime.   Yes [provider]  sertraline (ZOLOFT) 25 MG tablet Take 25 mg by mouth daily. 11/03/19  Yes [provider]  venlafaxine XR (EFFEXOR-XR) 150 MG 24 hr capsule Take 150 mg by mouth daily with breakfast.   Yes [provider]  b complex vitamins tablet Take 1 tablet by mouth daily.    [provider]  lisinopril (PRINIVIL,ZESTRIL) 5 MG tablet Take 5 mg by mouth daily.    [provider]  meloxicam (MOBIC) 7.5 MG tablet TAKE 1 TABLET BY MOUTH ONCE DAILY 03/01/18   Tat, Eustace Quail, DO  multivitamin-lutein (OCUVITE-LUTEIN) CAPS capsule Take 1 capsule by mouth daily.    [provider]  ranitidine (ZANTAC) 150 MG tablet Take 150 mg by mouth 2 (two) times daily. 11/05/17   [provider]    Physical Exam: Vitals:   11/21/19 1330 11/21/19 1400 11/21/19 1430 11/21/19 1500  BP: (!) 142/78 (!) 142/73 (!) 134/107 125/76  Pulse: 61 70 (!) 59 (!) 58  Resp: (!) 27 20 (!) 26 20  SpO2: 96% 97% 95% 95%  Weight:      Height:         Vitals:   11/21/19 1330 11/21/19 1400 11/21/19 1430 11/21/19 1500  BP: (!) 142/78 (!) 142/73 (!) 134/107 125/76  Pulse: 61 70 (!) 59 (!) 58  Resp: (!) 27 20 (!) 26 20  SpO2: 96% 97% 95% 95%  Weight:      Height:        Constitutional: NAD, alert and oriented to person place and time Eyes: PERRL, lids and conjunctivae normal ENMT: Mucous membranes are moist.  Neck: normal, supple, no masses, no thyromegaly Respiratory: clear to auscultation bilaterally, no wheezing, no crackles. Normal respiratory effort. No accessory muscle use.  Cardiovascular: Regular rate and rhythm, no murmurs / rubs / gallops. No extremity edema. 2+ pedal pulses. No  carotid bruits.  Abdomen: no tenderness, no masses palpated. No hepatosplenomegaly. Bowel sounds positive.  Musculoskeletal: no clubbing / cyanosis. No joint deformity upper and lower extremities.  Skin: no rashes, lesions, ulcers.  Neurologic: No gross focal neurologic deficit. Psychiatric: Normal mood and affect.   Labs on Admission: I have personally reviewed following labs and imaging studies  CBC: Recent Labs  Lab 11/21/19 1225  WBC 4.6  NEUTROABS 3.4  HGB 15.4  HCT 47.4  MCV 99.2  PLT [id number]   Basic Metabolic Panel: Recent Labs  Lab 11/21/19 1225  NA 143  K 4.4  CL 105  CO2  26  GLUCOSE 122*  BUN 20  CREATININE 1.03  CALCIUM 8.9   GFR: Estimated Creatinine Clearance: 44.8 mL/min (by C-G formula based on SCr of 1.03 mg/dL). Liver Function Tests: No results for input(s): AST, ALT, ALKPHOS, BILITOT, PROT, ALBUMIN in the last 168 hours. No results for input(s): LIPASE, AMYLASE in the last 168 hours. No results for input(s): AMMONIA in the last 168 hours. Coagulation Profile: No results for input(s): INR, PROTIME in the last 168 hours. Cardiac Enzymes: No results for input(s): CKTOTAL, CKMB, CKMBINDEX, TROPONINI in the last 168 hours. BNP (last 3 results) No results for input(s): PROBNP in the last 8760 hours. HbA1C: No results for input(s): HGBA1C in the last 72 hours. CBG: No results for input(s): GLUCAP in the last 168 hours. Lipid Profile: No results for input(s): CHOL, HDL, LDLCALC, TRIG, CHOLHDL, LDLDIRECT in the last 72 hours. Thyroid Function Tests: No results for input(s): TSH, T4TOTAL, FREET4, T3FREE, THYROIDAB in the last 72 hours. Anemia Panel: No results for input(s): VITAMINB12, FOLATE, FERRITIN, TIBC, IRON, RETICCTPCT in the last 72 hours. Urine analysis: No results found for: COLORURINE, APPEARANCEUR, LABSPEC, [place], GLUCOSEU, [place], BILIRUBINUR, KETONESUR, PROTEINUR, UROBILINOGEN, NITRITE, LEUKOCYTESUR  Radiological Exams on Admission: DG  Chest Port 1 View  Result Date: 11/21/2019 CLINICAL DATA:  Chest pain EXAM: PORTABLE CHEST 1 VIEW COMPARISON:  12/14/2018 FINDINGS: Left-sided implanted cardiac device in stable positioning. Mild cardiomegaly. Calcific aortic knob. Small to moderate right-sided pleural effusion. Possible trace left pleural effusion. Hazy bibasilar opacities. No pneumothorax. IMPRESSION: 1. Small to moderate right-sided pleural effusion. Possible trace left pleural effusion. 2. Associated bibasilar opacities may reflect a combination of atelectasis and/or pneumonia. Electronically Signed   By: Davina Poke D.O.   On: 11/21/2019 12:47    EKG: Independently reviewed.  Sinus rhythm, LVH  Assessment/Plan Active Problems:   Acute on chronic systolic heart failure (HCC)   AF (paroxysmal atrial fibrillation) (HCC)   Depression   Dementia due to Parkinson's disease without behavioral disturbance (HCC)     Acute on chronic systolic heart failure Patient with a last known LVEF of 40% presents for evaluation of shortness of breath and noted to have bilateral pleural effusion Start patient on Lasix 40 mg IV every 12 Continue metoprolol and lisinopril We will repeat 2D echocardiogram to assess LVEF Maintain low-sodium diet and daily weights   History of paroxysmal atrial fibrillation Continue amiodarone and metoprolol for rate control Patient not on long-term anticoagulation due to increased risk for falls   History of dementia due to Parkinson's disease Continue Sinemet And is on multiple antidepressants as well as donepezil and Namenda which will be continued during this hospitalization    History of coronary artery disease Patient has elevated troponin levels and this may be rated to his acute CHF exacerbation We will obtain serial cardiac enzymes Consult cardiology Continue aspirin and statins   Acute bronchitis Will start patient on Doxycycline 100mg  BID for 5 days    DVT prophylaxis:  Lovenox Code Status: Full code Family Communication: Plan of care discussed with patient in detail.  His is his healthcare power of attorney Disposition Plan: Back to previous  Environment (SNF) Consults called: Cardiology    Collier Bullock MD [facility]     11/21/2019, 3:35 PM

## 2019-11-21 NOTE — ED Triage Notes (Signed)
Pt arrived via [facility] from [facility] staff called EMS out for abnormal labs. Pt had X-ray yesterday and per staff pt had fluid on the heart. VS normal for EMS during trx.

## 2019-11-21 NOTE — ED Notes (Signed)
Pt transported to room 251 

## 2019-11-21 NOTE — ED Notes (Signed)
Pt presents today from [facility] living facility. Staff called EMS for abnormal lab values. Staff told EMS pt had x-ray yesterday which showed "fluid on the heart".

## 2019-11-22 ENCOUNTER — Inpatient Hospital Stay (HOSPITAL_COMMUNITY)
Admit: 2019-11-22 | Discharge: 2019-11-22 | Disposition: A | Discharge status: Home / Self Care | Payer: Medicare Other | Attending: Cardiovascular Disease | Admitting: Cardiovascular Disease

## 2019-11-22 DIAGNOSIS — F0280 Dementia in other diseases classified elsewhere without behavioral disturbance: Secondary | ICD-10-CM

## 2019-11-22 DIAGNOSIS — I25118 Atherosclerotic heart disease of native coronary artery with other forms of angina pectoris: Secondary | ICD-10-CM

## 2019-11-22 DIAGNOSIS — I5023 Acute on chronic systolic (congestive) heart failure: Secondary | ICD-10-CM

## 2019-11-22 DIAGNOSIS — Z950 Presence of cardiac pacemaker: Secondary | ICD-10-CM

## 2019-11-22 DIAGNOSIS — J40 Bronchitis, not specified as acute or chronic: Secondary | ICD-10-CM

## 2019-11-22 DIAGNOSIS — I5021 Acute systolic (congestive) heart failure: Secondary | ICD-10-CM

## 2019-11-22 DIAGNOSIS — G20 Parkinson's disease: Secondary | ICD-10-CM

## 2019-11-22 DIAGNOSIS — I480 Paroxysmal atrial fibrillation: Secondary | ICD-10-CM

## 2019-11-22 LAB — BASIC METABOLIC PANEL
Anion gap: 14 (ref 5–15)
BUN: 23 mg/dL (ref 8–23)
CO2: 26 mmol/L (ref 22–32)
Calcium: 8.4 mg/dL — ABNORMAL LOW (ref 8.9–10.3)
Chloride: 102 mmol/L (ref 98–111)
Creatinine, Ser: 1.19 mg/dL (ref 0.61–1.24)
GFR calc Af Amer: >60 mL/min (ref >60)
GFR calc non Af Amer: 52 mL/min — ABNORMAL LOW (ref >60)
Glucose, Bld: 101 mg/dL — ABNORMAL HIGH (ref 70–99)
Potassium: 4 mmol/L (ref 3.5–5.1)
Sodium: 142 mmol/L (ref 135–145)

## 2019-11-22 LAB — ECHOCARDIOGRAM COMPLETE
Height: 69 in
Weight: 2334.4 oz

## 2019-11-22 MED ORDER — DOXYCYCLINE HYCLATE 100 MG PO TABS
100.0000 mg | ORAL_TABLET | Freq: Two times a day (BID) | ORAL | Status: DC
  Administered 2019-11-22 – 2019-11-23 (×2): 100 mg via ORAL
  Filled 2019-11-22 (×2): qty 1

## 2019-11-22 MED ORDER — FUROSEMIDE 10 MG/ML IJ SOLN
40.0000 mg | Freq: Two times a day (BID) | INTRAMUSCULAR | Status: AC
  Administered 2019-11-22: 40 mg via INTRAVENOUS

## 2019-11-22 MED ORDER — FUROSEMIDE 40 MG PO TABS
40.0000 mg | ORAL_TABLET | Freq: Two times a day (BID) | ORAL | Status: DC
  Administered 2019-11-23: 09:00:00 40 mg via ORAL
  Filled 2019-11-22: qty 1

## 2019-11-22 NOTE — Progress Notes (Signed)
PROGRESS NOTE                                                                                                                                                                                                             Patient Demographics:    Eric Hendricks, is a 86 y.o. male, DOB - [birth year], PH:[id number]  Admit date - 11/21/2019   Admitting Physician Collier Bullock, MD  Outpatient Primary MD for the patient is [name], MD  LOS - 1  Outpatient Specialists: Cardiology  Chief Complaint  Patient presents with  . Cough       Brief Narrative 86 year old male with history of Parkinson's disease with dementia, CAD, chronic systolic CHF, depression presented with increasing shortness of breath associated with cough with productive green phlegm.  Denies any fevers or chills.  In the ED he was found to have elevated BNP with chest x-ray showing small to moderate bilateral pleural effusion.  O2 sat of high 80s on room air requiring supplemental oxygen.  Admitted for management of acute on chronic systolic CHF and acute bronchitis.   Subjective:   Patient seen and examined.  Reports her breathing to be slightly better since admission but still not at baseline.  still having some productive cough.   Assessment  & Plan :   Principal problems:   Acute on chronic systolic heart failure (HCC) Likely triggered by acute bronchitis.  EF of 40% previously.  Started on IV Lasix 40 mg every 12 hours and appears to be diuresing well.  Cardiology consult appreciated and recommends transition to p.o. Lasix in a.m.  Continue metoprolol, lisinopril, aspirin and statin. Thoracentesis not needed at this time.  Active problems  Acute bronchitis We will add oral doxycycline.  AF (paroxysmal atrial fibrillation) (HCC) Rate controlled.  Continue beta-blocker and amiodarone.  Parkinson disease with dementia Continue Sinemet, Aricept and  Namenda  Chronic depression Continue Remeron and Effexor.    Code Status : Full code  Family Communication  : None  Disposition Plan  : Possibly return to SNF tomorrow if symptoms improved.  Barriers For Discharge : Active CHF symptoms  Consults  : Cardiology  Procedures  : 2D echo  DVT Prophylaxis  :  Lovenox -   Lab Results  Component Value Date   PLT 141 (L) 11/21/2019    Antibiotics  :  Anti-infectives (From admission, onward)   None        Objective:   Vitals:   11/21/19 1635 11/21/19 1952 11/22/19 0420 11/22/19 0800  BP: 120/85 108/69 (!) 107/55 (!) 142/71  Pulse: 61 72 65 67  Resp: 16   18  Temp: 98.4 F (36.9 C) 97.8 F (36.6 C) (!) 97.5 F (36.4 C) 98.4 F (36.9 C)  TempSrc: Oral Oral Oral   SpO2: 100% 93% 90% 99%  Weight: 73.6 kg  66.2 kg   Height: 5\' 9"  (1.753 m)       Wt Readings from Last 3 Encounters:  11/22/19 66.2 kg  06/11/19 72.8 kg  12/01/18 75.3 kg     Intake/Output Summary (Last 24 hours) at 11/22/2019 1309 Last data filed at 11/22/2019 1044 Gross per 24 hour  Intake 480 ml  Output 832 ml  Net -352 ml     Physical Exam  Gen: Elderly male not in distress HEENT:  moist mucosa, supple neck Chest: Fine bibasilar crackles CVS: N S1&S2, no murmurs, GI: soft, NT, ND, BS+ Musculoskeletal: warm, no edema     Data Review:    CBC Recent Labs  Lab 11/21/19 1225  WBC 4.6  HGB 15.4  HCT 47.4  PLT 141*  MCV 99.2  MCH 32.2  MCHC 32.5  RDW 14.9  LYMPHSABS 0.5*  MONOABS 0.4  EOSABS 0.3  BASOSABS 0.0    Chemistries  Recent Labs  Lab 11/21/19 1225 11/22/19 0433  NA 143 142  K 4.4 4.0  CL 105 102  CO2 26 26  GLUCOSE 122* 101*  BUN 20 23  CREATININE 1.03 1.19  CALCIUM 8.9 8.4*   ------------------------------------------------------------------------------------------------------------------ No results for input(s): CHOL, HDL, LDLCALC, TRIG, CHOLHDL, LDLDIRECT in the last 72 hours.  No results found for:  HGBA1C ------------------------------------------------------------------------------------------------------------------ No results for input(s): TSH, T4TOTAL, T3FREE, THYROIDAB in the last 72 hours.  Invalid input(s): FREET3 ------------------------------------------------------------------------------------------------------------------ No results for input(s): VITAMINB12, FOLATE, FERRITIN, TIBC, IRON, RETICCTPCT in the last 72 hours.  Coagulation profile No results for input(s): INR, PROTIME in the last 168 hours.  No results for input(s): DDIMER in the last 72 hours.  Cardiac Enzymes No results for input(s): CKMB, TROPONINI, MYOGLOBIN in the last 168 hours.  Invalid input(s): CK ------------------------------------------------------------------------------------------------------------------    Component Value Date/Time   BNP 473.0 (H) 11/21/2019 1225    Inpatient Medications  Scheduled Meds: . amiodarone  100 mg Oral Q0600  . ascorbic acid  500 mg Oral TID  . aspirin EC  81 mg Oral Daily  . atorvastatin  80 mg Oral QPM  . carbidopa-levodopa  1 tablet Oral TID  . divalproex  250 mg Oral BID  . docusate sodium  100 mg Oral BID  . donepezil  10 mg Oral QHS  . enoxaparin (LOVENOX) injection  40 mg Subcutaneous Q24H  . famotidine  20 mg Oral Daily  . furosemide  40 mg Intravenous BID  . lisinopril  5 mg Oral Daily  . memantine  10 mg Oral BID  . metoprolol succinate  25 mg Oral Daily  . mirtazapine  15 mg Oral QHS  . multivitamin  1 tablet Oral Daily  . multivitamin-lutein  1 capsule Oral Daily  . sodium chloride flush  3 mL Intravenous Q12H  . venlafaxine XR  150 mg Oral Q breakfast   Continuous Infusions: . sodium chloride     PRN Meds:.sodium chloride, acetaminophen, ondansetron (ZOFRAN) IV, sodium chloride flush  Micro Results Recent Results (from the past  240 hour(s))  Respiratory Panel by RT PCR (Flu A&B, Covid) - Nasopharyngeal Swab     Status: None    Collection Time: 11/21/19 12:25 PM   Specimen: Nasopharyngeal Swab  Result Value Ref Range Status   SARS Coronavirus 2 by RT PCR NEGATIVE NEGATIVE Final    Comment: (NOTE) SARS-CoV-2 target nucleic acids are NOT DETECTED. The SARS-CoV-2 RNA is generally detectable in upper respiratoy specimens during the acute phase of infection. The lowest concentration of SARS-CoV-2 viral copies this assay can detect is 131 copies/mL. A negative result does not preclude SARS-Cov-2 infection and should not be used as the sole basis for treatment or other patient management decisions. A negative result may occur with  improper specimen collection/handling, submission of specimen other than nasopharyngeal swab, presence of viral mutation(s) within the areas targeted by this assay, and inadequate number of viral copies (<131 copies/mL). A negative result must be combined with clinical observations, patient history, and epidemiological information. The expected result is Negative. Fact Sheet for Patients:  PinkCheek.be Fact Sheet for Healthcare Providers:  GravelBags.it This test is not yet ap proved or cleared by the [place] FDA and  has been authorized for detection and/or diagnosis of SARS-CoV-2 by FDA under an Emergency Use Authorization (EUA). This EUA will remain  in effect (meaning this test can be used) for the duration of the COVID-19 declaration under Section 564(b)(1) of the Act, 21 U.S.C. section 360bbb-3(b)(1), unless the authorization is terminated or revoked sooner.    Influenza A by PCR NEGATIVE NEGATIVE Final   Influenza B by PCR NEGATIVE NEGATIVE Final    Comment: (NOTE) The Xpert Xpress SARS-CoV-2/FLU/RSV assay is intended as an aid in  the diagnosis of influenza from Nasopharyngeal swab specimens and  should not be used as a sole basis for treatment. Nasal washings and  aspirates are unacceptable for Xpert Xpress  SARS-CoV-2/FLU/RSV  testing. Fact Sheet for Patients: PinkCheek.be Fact Sheet for Healthcare Providers: GravelBags.it This test is not yet approved or cleared by the [place] FDA and  has been authorized for detection and/or diagnosis of SARS-CoV-2 by  FDA under an Emergency Use Authorization (EUA). This EUA will remain  in effect (meaning this test can be used) for the duration of the  Covid-19 declaration under Section 564(b)(1) of the Act, 21  U.S.C. section 360bbb-3(b)(1), unless the authorization is  terminated or revoked. Performed at [facility], [address] [place], [place] [address]     Radiology Reports DG Chest [place] 1 View  Result Date: 11/21/2019 CLINICAL DATA:  Chest pain EXAM: PORTABLE CHEST 1 VIEW COMPARISON:  12/14/2018 FINDINGS: Left-sided implanted cardiac device in stable positioning. Mild cardiomegaly. Calcific aortic knob. Small to moderate right-sided pleural effusion. Possible trace left pleural effusion. Hazy bibasilar opacities. No pneumothorax. IMPRESSION: 1. Small to moderate right-sided pleural effusion. Possible trace left pleural effusion. 2. Associated bibasilar opacities may reflect a combination of atelectasis and/or pneumonia. Electronically Signed   By: Davina Poke D.O.   On: 11/21/2019 12:47    Time Spent in minutes  25   Glorene Leitzke M.D on 11/22/2019 at 1:09 PM  Between 7am to 7pm - Pager - [phone]  After 7pm go to [email or web address] - password [email or web address]  [facility] -  Office  [phone]

## 2019-11-22 NOTE — Progress Notes (Signed)
*  PRELIMINARY RESULTS* Echocardiogram 2D Echocardiogram has been performed.  [name] 11/22/2019, 1:20 PM

## 2019-11-22 NOTE — Progress Notes (Signed)
ReDS Pro not appropriate due to BMI < 22.

## 2019-11-22 NOTE — Consult Note (Signed)
Cardiology Consultation:   Patient ID: Eric Hendricks; VS:[id number]; [birth year]   Admit date: 11/21/2019 Date of Consult: 11/22/2019  Primary Care Provider: [name], MD Primary Cardiologist: [facility] Primary Electrophysiologist: [facility]   Patient Profile:   Rayshun Miklos is a 86 y.o. male with a hx of CAD status post PCI/DES to the LAD in 05/2018, HFrEF, PAF not on [place] secondary to fall risk, status post dual-chamber [facility] PPM in 06/2016, chronic bilateral pleural effusions status post right-sided thoracentesis in 11/2018, CKD stage III, syncope, melena, LBBB, Parkinson's dementia, bipolar disorder, prostate cancer status post TURP in 2015, anxiety, and GERD who is being seen today for the evaluation of CHF at the request of Dr. Francine Graven.  History of Present Illness:   Mr. Purucker has been followed at [facility] for his cardiology care.  Remote echo from 11/2015 showed an EF of 30 to 35% with global LV hypokinesis.  Repeat echo in 05/2016 showed subsequent improvement in his LV systolic function with an EF of 50 to 55%.  Echo from 09/2017 showed further reduction in LV systolic function with an EF of 40%.  He was admitted to [facility] in 05/2018 a non-STEMI.  LHC showed obstructive diffuse proximal to mid LAD disease status post PCI/DES.  There was residual moderate to borderline proximal LCx disease as well as moderate to borderline mid RCA disease.  Echo at that time showed an EF of 45 to 50% with mild global hypokinesis as well as mild MR/TR, mild AI.  Most recent echo from 12/2018 showed an EF of 35 to [id number], grade 1 diastolic dysfunction, moderate LVH, moderate LV hypokinesis, PASP 44 mmHg.  He has subsequently completed 1 year of DAPT with Plavix being discontinued in 05/2019 in the setting of possible melena.  He has not been felt to be a candidate for [place] secondary to fall risk.  Patient  indicates for the past several days he has had increased runs of breath with cough productive of green to yellow sputum.  In this setting, other residents at his skilled nursing facility recommended he come to the hospital.  He has denied any fever, chills, chest pain, lower extremity swelling, abdominal distention, orthopnea, dizziness, presyncope, syncope.  Upon the patient's arrival to [facility] they were found to have stable BP and heart rate with oxygen saturations in the low 90s to upper 80s on room air requiring supplemental oxygen via nasal cannula at 2 L. EKG showed sinus rhythm with known left bundle, 69 bpm, CXR showed small to moderate right-sided pleural effusion with possible trace left-sided pleural effusion with associated bibasilar opacities possibly reflecting combination of atelectasis or pneumonia. Labs showed high-sensitivity troponin 32 with a delta of 35, BNP 473, BUN 20, serum creatinine 1.03, potassium 4.4, Hgb 15.4, PLT 141, COVID-19 negative.  Since admission, he has received IV Lasix 40 mg x 2 with a documented urine output of 590 cc for the admission.  Weights are of uncertain accuracy with range of 66 to 75 kg documented.  Currently notes improved shortness of breath and is able to lay completely supine without dyspnea.  No chest pain.  He notes his cough has improved.  No lower extremity swelling or abdominal distention.  Past Medical History:  Diagnosis Date  . B12 deficiency   . CHF (congestive heart failure) ([place])   . Dementia ([place])   . Depression   . Hyperlipemia   .  Parkinson's disease ([place])   . Prostate cancer [facility])     Past Surgical History:  Procedure Laterality Date  . ANKLE SURGERY Right   . APPENDECTOMY    . CATARACT EXTRACTION Bilateral   . PROSTATECTOMY       Home Meds: Prior to Admission medications   Medication Sig Start Date End Date Taking? Authorizing Provider  amiodarone (PACERONE) 200 MG tablet Take 100 mg by mouth daily at 6 (six) AM. 11/03/19   Yes [provider]  ascorbic acid (VITAMIN C) 500 MG tablet Take 500 mg by mouth 3 (three) times daily.   Yes [provider]  aspirin EC 81 MG tablet Take 81 mg by mouth daily.   Yes [provider]  atorvastatin (LIPITOR) 80 MG tablet Take 80 mg by mouth every evening. 11/03/19  Yes [provider]  carbidopa-levodopa (SINEMET IR) 25-100 MG tablet TAKE 1 & 1/2 TABLET BY MOUTH EVERY MORNING TAKE 1 TABLET BY MOUTH THREE TIMES A DAY Patient taking differently: Take 1.5 tablets by mouth every morning.  05/02/19  Yes Tat, Eustace Quail, DO  divalproex (DEPAKOTE) 250 MG DR tablet Take 1 tablet (250 mg total) by mouth 2 (two) times daily. 08/08/19  Yes Tat, Eustace Quail, DO  docusate sodium (COLACE) 100 MG capsule Take 100 mg by mouth 2 (two) times daily.   Yes [provider]  donepezil (ARICEPT) 10 MG tablet TAKE 1 TABLET BY MOUTH AT BEDTIME Patient taking differently: Take 10 mg by mouth at bedtime.  05/02/19  Yes Tat, Eustace Quail, DO  famotidine (PEPCID) 20 MG tablet Take 20 mg by mouth 2 (two) times daily.   Yes [provider]  memantine (NAMENDA) 10 MG tablet TAKE 1 TABLET BY MOUTH TWICE A DAY Patient taking differently: Take 10 mg by mouth 2 (two) times daily.  05/02/19  Yes Tat, Eustace Quail, DO  metoprolol succinate (TOPROL-XL) 25 MG 24 hr tablet Take 25 mg by mouth daily. 11/03/17  Yes [provider]  mirtazapine (REMERON) 15 MG tablet Take 15 mg by mouth at bedtime.   Yes [provider]  sertraline (ZOLOFT) 25 MG tablet Take 25 mg by mouth daily. 11/03/19  Yes [provider]  venlafaxine XR (EFFEXOR-XR) 150 MG 24 hr capsule Take 150 mg by mouth daily with breakfast.   Yes [provider]  b complex vitamins tablet Take 1 tablet by mouth daily.    [provider]  lisinopril (PRINIVIL,ZESTRIL) 5 MG tablet Take 5 mg by mouth daily.    [provider]  meloxicam (MOBIC) 7.5 MG tablet TAKE 1 TABLET BY  MOUTH ONCE DAILY 03/01/18   Tat, Eustace Quail, DO  multivitamin-lutein (OCUVITE-LUTEIN) CAPS capsule Take 1 capsule by mouth daily.    [provider]  ranitidine (ZANTAC) 150 MG tablet Take 150 mg by mouth 2 (two) times daily. 11/05/17   [provider]    Inpatient Medications: Scheduled Meds: . amiodarone  100 mg Oral Q0600  . ascorbic acid  500 mg Oral TID  . aspirin EC  81 mg Oral Daily  . atorvastatin  80 mg Oral QPM  . carbidopa-levodopa  1 tablet Oral TID  . divalproex  250 mg Oral BID  . docusate sodium  100 mg Oral BID  . donepezil  10 mg Oral QHS  . enoxaparin (LOVENOX) injection  40 mg Subcutaneous Q24H  . famotidine  20 mg Oral Daily  . furosemide  40 mg Intravenous BID  . lisinopril  5 mg Oral Daily  . memantine  10 mg Oral BID  . metoprolol succinate  25 mg Oral Daily  . mirtazapine  15 mg Oral QHS  . multivitamin  1 tablet Oral Daily  . multivitamin-lutein  1 capsule Oral Daily  . sodium chloride flush  3 mL Intravenous Q12H  . venlafaxine XR  150 mg Oral Q breakfast   Continuous Infusions: . sodium chloride     PRN Meds: sodium chloride, acetaminophen, ondansetron (ZOFRAN) IV, sodium chloride flush  Allergies:   Allergies  Allergen Reactions  . Phenylephrine-Guaifenesin Palpitations  . Aspirin Other (See Comments)  . Guaifenesin Other (See Comments)    Unknown reaction  . Guanfacine   . Phenylephrine   . Suprax [Cefixime]     Social History:   Social History   Socioeconomic History  . Marital status: Widowed    Spouse name: Not on file  . Number of children: 3  . Years of education: Not on file  . Highest education level: Some college, no degree  Occupational History  . Not on file  Tobacco Use  . Smoking status: Never Smoker  . Smokeless tobacco: Never Used  Substance and Sexual Activity  . Alcohol use: No    Alcohol/week: 0.0 standard drinks  . Drug use: No  . Sexual activity: Not on file  Other Topics Concern  . Not on  file  Social History Narrative   Pt lives at [facility]   Right handed   Pt has 3 children   He drinks some coffee, tea, mostly milk.    Social Determinants of Health   Financial Resource Strain:   . Difficulty of Paying Living Expenses: Not on file  Food Insecurity:   . Worried About Charity fundraiser in the Last Year: Not on file  . Ran Out of Food in the Last Year: Not on file  Transportation Needs:   . Lack of Transportation (Medical): Not on file  . Lack of Transportation (Non-Medical): Not on file  Physical Activity:   . Days of Exercise per Week: Not on file  . Minutes of Exercise per Session: Not on file  Stress:   . Feeling of Stress : Not on file  Social Connections:   . Frequency of Communication with Friends and Family: Not on file  . Frequency of Social Gatherings with Friends and Family: Not on file  . Attends Religious Services: Not on file  . Active Member of Clubs or Organizations: Not on file  . Attends Archivist Meetings: Not on file  . Marital Status: Not on file  Intimate Partner Violence:   . Fear of Current or Ex-Partner: Not on file  . Emotionally Abused: Not on file  . Physically Abused: Not on file  . Sexually Abused: Not on file     Family History:   Family History  Problem Relation Age of Onset  . Colon cancer Father   . Tremor Mother   . Parkinson's disease Brother   . Prostate cancer Brother   . Lung cancer Sister   . Emphysema Sister   . Heart failure Child   . Diabetes Child   . AAA (abdominal aortic aneurysm) Child     ROS:  Review of Systems  Constitutional: Positive for malaise/fatigue. Negative for chills, diaphoresis, fever and weight loss.  HENT: Negative for congestion.   Eyes: Negative for discharge and redness.  Respiratory: Positive for sputum production, shortness of breath and  wheezing. Negative for cough and hemoptysis.        Green to yellow sputum  Cardiovascular:  Negative for chest pain, palpitations, orthopnea, claudication, leg swelling and PND.  Gastrointestinal: Negative for abdominal pain, blood in stool, heartburn, melena, nausea and vomiting.  Genitourinary: Negative for hematuria.  Musculoskeletal: Negative for falls and myalgias.  Skin: Negative for rash.  Neurological: Positive for weakness. Negative for dizziness, tingling, tremors, sensory change, speech change, focal weakness and loss of consciousness.  Endo/Heme/Allergies: Does not bruise/bleed easily.  Psychiatric/Behavioral: Negative for substance abuse. The patient is not nervous/anxious.   All other systems reviewed and are negative.     Physical Exam/Data:   Vitals:   11/21/19 1530 11/21/19 1635 11/21/19 1952 11/22/19 0420  BP: 123/68 120/85 108/69 (!) 107/55  Pulse: 65 61 72 65  Resp: (!) 26 16    Temp:  98.4 F (36.9 C) 97.8 F (36.6 C) (!) 97.5 F (36.4 C)  TempSrc:  Oral Oral Oral  SpO2: 95% 100% 93% 90%  Weight:  73.6 kg  66.2 kg  Height:  5\' 9"  (1.753 m)      Intake/Output Summary (Last 24 hours) at 11/22/2019 0732 Last data filed at 11/22/2019 0517 Gross per 24 hour  Intake 240 ml  Output 830 ml  Net -590 ml   Filed Weights   11/21/19 1214 11/21/19 1635 11/22/19 0420  Weight: 75.3 kg 73.6 kg 66.2 kg   Body mass index is 21.55 kg/m.   Physical Exam: General: Well developed, well nourished, in no acute distress. Head: Normocephalic, atraumatic, sclera non-icteric, no xanthomas, nares without discharge.  Neck: Negative for carotid bruits. JVD not elevated. Lungs: Mildly diminished breath sounds along the right base. Breathing is unlabored. Heart: RRR with S1 S2. No murmurs, rubs, or gallops appreciated. Abdomen: Soft, non-tender, non-distended with normoactive bowel sounds. No hepatomegaly. No rebound/guarding. No obvious abdominal masses. Msk:  Strength and tone appear normal for age. Extremities: No clubbing or cyanosis. No edema. Distal pedal pulses are  2+ and equal bilaterally. Neuro: Alert and oriented X 3. No facial asymmetry. No focal deficit. Moves all extremities spontaneously. Psych:  Responds to questions appropriately with a normal affect.   EKG:  The EKG was personally reviewed and demonstrates: NSR, 69 bpm, LBBB (known) Telemetry:  Telemetry was personally reviewed and demonstrates: Paced rhythm  Weights: Filed Weights   11/21/19 1214 11/21/19 1635 11/22/19 0420  Weight: 75.3 kg 73.6 kg 66.2 kg    Relevant CV Studies: Echo from outside facility as outlined above with echo this admission pending.  Laboratory Data:  Chemistry Recent Labs  Lab 11/21/19 1225 11/22/19 0433  NA 143 142  K 4.4 4.0  CL 105 102  CO2 26 26  GLUCOSE 122* 101*  BUN 20 23  CREATININE 1.03 1.19  CALCIUM 8.9 8.4*  GFRNONAA >60 52*  GFRAA >60 >60  ANIONGAP 12 14    No results for input(s): PROT, ALBUMIN, AST, ALT, ALKPHOS, BILITOT in the last 168 hours. Hematology Recent Labs  Lab 11/21/19 1225  WBC 4.6  RBC 4.78  HGB 15.4  HCT 47.4  MCV 99.2  MCH 32.2  MCHC 32.5  RDW 14.9  PLT 141*   Cardiac EnzymesNo results for input(s): TROPONINI in the last 168 hours. No results for input(s): TROPIPOC in the last 168 hours.  BNP Recent Labs  Lab 11/21/19 1225  BNP 473.0*    DDimer No results for input(s): DDIMER in the last 168 hours.  Radiology/Studies:  [employer]  Chest Port 1 View  Result Date: 11/21/2019 IMPRESSION: 1. Small to moderate right-sided pleural effusion. Possible trace left pleural effusion. 2. Associated bibasilar opacities may reflect a combination of atelectasis and/or pneumonia. Electronically Signed   By: Davina Poke D.O.   On: 11/21/2019 12:47    Assessment and Plan:   1.  Acute hypoxic respiratory distress: -Likely multifactorial including acute on chronic HFrEF, recurrent bilateral pleural effusions, and acute bronchitis -Wean supplemental oxygen as tolerated per primary service  2.  Acute on chronic  HFrEF/recurrent bilateral pleural effusions: -Overall, he does not appear grossly volume overloaded and appears relatively compensated -Suspect symptoms are more so related to bronchitis -Can continue gentle IV diuresis with Lasix 40 mg twice daily with KCl repletion as indicated over the next day with reassessing on 3/5 -Patient has previously required right-sided thoracentesis in 11/2018, repeating this may be of benefit prior to discharge  -Continue GDMT including lisinopril and Toprol-XL -Primary cardiology note indicates there is some hesitation with regards to adding further evidence-based therapy given medication confusion -In this setting, further escalation of GDMT will be deferred to the outpatient setting when he follows up with his primary cardiology group with consideration for addition of MRA/Entresto following ACE inhibitor washout -Given his advanced age and underlying Parkinson's disease with dementia it is unlikely he would be a candidate for device upgrade to CRT given his underlying LBBB with cardiomyopathy -CHF education -Daily weights -Strict I's and O's  3.  CAD involving the native coronary arteries with elevated troponin: -Never with symptoms of chest pain -Minimal troponin bump not diagnostic of ACS and likely in the setting of known native vessel CAD with volume overload and bronchitis -Update echo -If echo demonstrates stable cardiomyopathy, no plans for inpatient ischemic evaluation at this time given his acute illness, advanced age, and comorbid conditions  -Continue current medical therapy including aspirin, Lipitor, metoprolol, and lisinopril -He does have reported history of melena leading to the discontinuation of Plavix following his 1 year anniversary from PCI with hemoglobin stable this admission thus far  4.  PAF: -Appears to be maintaining sinus rhythm -Remains on PTA metoprolol and amiodarone  -LFT from 11/2018 normal -TSH 2017 normal, follow up with  primary cardiology -Not felt to be a candidate for [place] secondary to falls -CHADS2VASc at least 5 (CHF, HTN, age x 2, vascular disease) -Patient and family have been made aware of CVA risk  5.  Status post PPM: -Device appears to be functioning normally -Follow up with primary cardiologist as directed   6. Bronchitis: -Doxycycline per IM  7. Parkinson's disease with dementia: -PTA medications per IM    For questions or updates, please contact [place] Please consult [email or web address] for contact info under Cardiology/STEMI.   Signed, Christell Faith, PA-C [place] Pager: [phone] 11/22/2019, 7:32 AM

## 2019-11-23 LAB — BASIC METABOLIC PANEL
Anion gap: 9 (ref 5–15)
BUN: 26 mg/dL — ABNORMAL HIGH (ref 8–23)
CO2: 30 mmol/L (ref 22–32)
Calcium: 8.3 mg/dL — ABNORMAL LOW (ref 8.9–10.3)
Chloride: 102 mmol/L (ref 98–111)
Creatinine, Ser: 1.18 mg/dL (ref 0.61–1.24)
GFR calc Af Amer: >60 mL/min (ref >60)
GFR calc non Af Amer: 53 mL/min — ABNORMAL LOW (ref >60)
Glucose, Bld: 102 mg/dL — ABNORMAL HIGH (ref 70–99)
Potassium: 3.8 mmol/L (ref 3.5–5.1)
Sodium: 141 mmol/L (ref 135–145)

## 2019-11-23 MED ORDER — FUROSEMIDE 40 MG PO TABS: 40.0000 mg | ORAL_TABLET | Freq: Two times a day (BID) | ORAL | 0 refills | Status: AC

## 2019-11-23 MED ORDER — DOXYCYCLINE HYCLATE 100 MG PO TABS: 100.0000 mg | ORAL_TABLET | Freq: Two times a day (BID) | ORAL | 0 refills | Status: AC

## 2019-11-23 NOTE — Discharge Instructions (Signed)
Heart Failure, Self Care Heart failure is a serious condition. This sheet explains things you need to do to take care of yourself at home. To help you stay as healthy as possible, you may be asked to change your diet, take certain medicines, and make other changes in your life. Your doctor may also give you more specific instructions. If you have problems or questions, call your doctor. What are the risks? Having heart failure makes it more likely for you to have some problems. These problems can get worse if you do not take good care of yourself. Problems may include:  Blood clotting problems. This may cause a stroke.  Damage to the kidneys, liver, or lungs.  Abnormal heart rhythms. Supplies needed:  Scale for weighing yourself.  Blood pressure monitor.  Notebook.  Medicines. How to care for yourself when you have heart failure Medicines Take over-the-counter and prescription medicines only as told by your doctor. Take your medicines every day.  Do not stop taking your medicine unless your doctor tells you to do so.  Do not skip any medicines.  Get your prescriptions refilled before you run out of medicine. This is important. Eating and drinking   Eat heart-healthy foods. Talk with a diet specialist (dietitian) to create an eating plan.  Choose foods that: ? Have no trans fat. ? Are low in saturated fat and cholesterol.  Choose healthy foods, such as: ? Fresh or frozen fruits and vegetables. ? Fish. ? Low-fat (lean) meats. ? Legumes, such as beans, peas, and lentils. ? Fat-free or low-fat dairy products. ? Whole-grain foods. ? High-fiber foods.  Limit salt (sodium) if told by your doctor. Ask your diet specialist to tell you which seasonings are healthy for your heart.  Cook in healthy ways instead of frying. Healthy ways of cooking include roasting, grilling, broiling, baking, poaching, steaming, and stir-frying.  Limit how much fluid you drink, if told by your  doctor. Alcohol use  Do not drink alcohol if: ? Your doctor tells you not to drink. ? Your heart was damaged by alcohol, or you have very bad heart failure. ? You are pregnant, may be pregnant, or are planning to become pregnant.  If you drink alcohol: ? Limit how much you use to:  0-1 drink a day for women.  0-2 drinks a day for men. ? Be aware of how much alcohol is in your drink. In the [place] one drink equals one 12 oz bottle of beer (355 mL), one 5 oz glass of wine (148 mL), or one 1 oz glass of hard liquor (44 mL). Lifestyle   Do not use any products that contain nicotine or tobacco, such as cigarettes, e-cigarettes, and chewing tobacco. If you need help quitting, ask your doctor. ? Do not use nicotine gum or patches before talking to your doctor.  Do not use illegal drugs.  Lose weight if told by your doctor.  Do physical activity if told by your doctor. Talk to your doctor before you begin an exercise if: ? You are an older adult. ? You have very bad heart failure.  Learn to manage stress. If you need help, ask your doctor.  Get rehab (rehabilitation) to help you stay independent and to help with your quality of life.  Plan time to rest when you get tired. Check weight and blood pressure   Weigh yourself every day. This will help you to know if fluid is building up in your body. ? Weigh yourself every morning   after you pee (urinate) and before you eat breakfast. ? Wear the same amount of clothing each time. ? Write down your daily weight. Give your record to your doctor.  Check and write down your blood pressure as told by your doctor.  Check your pulse as told by your doctor. Dealing with very hot and very cold weather  If it is very hot: ? Avoid activities that take a lot of energy. ? Use air conditioning or fans, or find a cooler place. ? Avoid caffeine and alcohol. ? Wear clothing that is loose-fitting, lightweight, and light-colored.  If it is very  cold: ? Avoid activities that take a lot of energy. ? Layer your clothes. ? Wear mittens or gloves, a hat, and a scarf when you go outside. ? Avoid alcohol. Follow these instructions at home:  Stay up to date with shots (vaccines). Get pneumococcal and flu (influenza) shots.  Keep all follow-up visits as told by your doctor. This is important. Contact a doctor if:  You gain weight quickly.  You have increasing shortness of breath.  You cannot do your normal activities.  You get tired easily.  You cough a lot.  You don't feel like eating or feel like you may vomit (nauseous).  You become puffy (swell) in your hands, feet, ankles, or belly (abdomen).  You cannot sleep well because it is hard to breathe.  You feel like your heart is beating fast (palpitations).  You get dizzy when you stand up. Get help right away if:  You have trouble breathing.  You or someone else notices a change in your behavior, such as having trouble staying awake.  You have chest pain or discomfort.  You pass out (faint). These symptoms may be an emergency. Do not wait to see if the symptoms will go away. Get medical help right away. Call your local emergency services (911 in the [place] Do not drive yourself to the hospital. Summary  Heart failure is a serious condition. To care for yourself, you may have to change your diet, take medicines, and make other lifestyle changes.  Take your medicines every day. Do not stop taking them unless your doctor tells you to do so.  Eat heart-healthy foods, such as fresh or frozen fruits and vegetables, fish, lean meats, legumes, fat-free or low-fat dairy products, and whole-grain or high-fiber foods.  Ask your doctor if you can drink alcohol. You may have to stop alcohol use if you have very bad heart failure.  Contact your doctor if you gain weight quickly or feel that your heart is beating too fast. Get help right away if you pass out, or have chest pain  or trouble breathing. This information is not intended to replace advice given to you by your health care provider. Make sure you discuss any questions you have with your health care provider. Document Revised: 12/19/2018 Document Reviewed: 12/20/2018 Elsevier Patient Education  2020 [employer]  

## 2019-11-23 NOTE — Progress Notes (Signed)
Progress Note  Patient Name: Eric Hendricks Date of Encounter: 11/23/2019  Primary Cardiologist: [facility]  Subjective   No chest pain. SOB and cough improving. No palpitations. States he wants to retire here. BUN 23-->26, SCr 1.19-->1.19. Transitioned to oral Lasix this morning. Echo showed stable CM with an EF of 35-40% with Gr2DD, normal RVSF and PASP with mildly dilated left atrium, and mild AS which was likely underestimated secondary to his cardiomyopathy.   Inpatient Medications    Scheduled Meds: . amiodarone  100 mg Oral Q0600  . ascorbic acid  500 mg Oral TID  . aspirin EC  81 mg Oral Daily  . atorvastatin  80 mg Oral QPM  . carbidopa-levodopa  1 tablet Oral TID  . divalproex  250 mg Oral BID  . docusate sodium  100 mg Oral BID  . donepezil  10 mg Oral QHS  . doxycycline  100 mg Oral Q12H  . enoxaparin (LOVENOX) injection  40 mg Subcutaneous Q24H  . famotidine  20 mg Oral Daily  . furosemide  40 mg Oral BID  . lisinopril  5 mg Oral Daily  . memantine  10 mg Oral BID  . metoprolol succinate  25 mg Oral Daily  . mirtazapine  15 mg Oral QHS  . multivitamin  1 tablet Oral Daily  . multivitamin-lutein  1 capsule Oral Daily  . sodium chloride flush  3 mL Intravenous Q12H  . venlafaxine XR  150 mg Oral Q breakfast   Continuous Infusions: . sodium chloride     PRN Meds: sodium chloride, acetaminophen, ondansetron (ZOFRAN) IV, sodium chloride flush   Vital Signs    Vitals:   11/23/19 0401 11/23/19 0406 11/23/19 0732 11/23/19 0852  BP: 116/63  (!) 102/56 103/64  Pulse: 76  63 70  Resp: 18  19   Temp: 97.8 F (36.6 C)  (!) 97.5 F (36.4 C)   TempSrc: Oral  Oral   SpO2: 94%  95% 92%  Weight:  66.3 kg    Height:        Intake/Output Summary (Last 24 hours) at 11/23/2019 0926 Last data filed at 11/23/2019 0500 Gross per 24 hour  Intake 720 ml  Output 952 ml  Net -232 ml   Filed Weights   11/21/19 1635 11/22/19 0420 11/23/19 0406  Weight: 73.6 kg 66.2 kg 66.3  kg    Telemetry    Paced rhythm with PVCs - Personally Reviewed  ECG    No new tracings - Personally Reviewed  Physical Exam   GEN: No acute distress.   Neck: No JVD. Cardiac: RRR, I/VI systolic murmur RUSB, no rubs, or gallops.  Respiratory: Slightly coarse breath sounds bilaterally.  GI: Soft, nontender, non-distended.   MS: No edema; No deformity. Neuro:  Alert and oriented x 3; Nonfocal.  Psych: Normal affect.  Labs    Chemistry Recent Labs  Lab 11/21/19 1225 11/22/19 0433 11/23/19 0802  NA 143 142 141  K 4.4 4.0 3.8  CL 105 102 102  CO2 26 26 30   GLUCOSE 122* 101* 102*  BUN 20 23 26*  CREATININE 1.03 1.19 1.18  CALCIUM 8.9 8.4* 8.3*  GFRNONAA >60 52* 53*  GFRAA >60 >60 >60  ANIONGAP 12 14 9      Hematology Recent Labs  Lab 11/21/19 1225  WBC 4.6  RBC 4.78  HGB 15.4  HCT 47.4  MCV 99.2  MCH 32.2  MCHC 32.5  RDW 14.9  PLT 141*    Cardiac EnzymesNo  results for input(s): TROPONINI in the last 168 hours. No results for input(s): TROPIPOC in the last 168 hours.   BNP Recent Labs  Lab 11/21/19 1225  BNP 473.0*     DDimer No results for input(s): DDIMER in the last 168 hours.   Radiology    DG Chest Port 1 View  Result Date: 11/21/2019 IMPRESSION: 1. Small to moderate right-sided pleural effusion. Possible trace left pleural effusion. 2. Associated bibasilar opacities may reflect a combination of atelectasis and/or pneumonia. Electronically Signed   By: Davina Poke D.O.   On: 11/21/2019 12:47    Cardiac Studies   2D echo 11/22/2019: 1. Left ventricular ejection fraction, by estimation, is 35 to 40%. The  left ventricle has moderately decreased function. The left ventricle  demonstrates global hypokinesis. There is mild left ventricular  hypertrophy. Left ventricular diastolic parameters are consistent with Grade II diastolic dysfunction (pseudonormalization).  2. Right ventricular systolic function is normal. The right ventricular    size is normal. There is normal pulmonary artery systolic pressure.  3. Left atrial size was mildly dilated.  4. The aortic valve is moderately calcified. Aortic valve regurgitation  is trivial. Visually appears to have mild aortic valve stenosis, gradient  likely underestimated secondary to depressed EF.  Patient Profile     86 y.o. male with history of CAD status post PCI/DES to the LAD in 05/2018, HFrEF, PAF not on [place] secondary to fall risk, status post dual-chamber [employer] PPM in 06/2016, chronic bilateral pleural effusions status post right-sided thoracentesis in 11/2018, CKD stage III, syncope, melena, LBBB, Parkinson's dementia, bipolar disorder, prostate cancer status post TURP in 2015, anxiety, and GERD who is being seen today for the evaluation of CHF.  Assessment & Plan    1. Acute hypoxic respiratory distress: -Improved -Likely multifactorial including acute on chronic HFrEF, recurrent bilateral pleural effusions, and acute bronchitis -Wean supplemental oxygen as tolerated per primary service  2.  Acute on chronic HFrEF/recurrent bilateral pleural effusions: -Overall, he does not appear grossly volume overloaded and appears relatively compensated -Suspect symptoms are more so related to bronchitis -Continue PO Lasix 40 mg bid -No indication for repeat thoracentesis at this time -Continue GDMT including lisinopril and Toprol-XL -Primary cardiology note indicates there is some hesitation with regards to adding further evidence-based therapy given medication confusion -In this setting, further escalation of GDMT will be deferred to the outpatient setting when he follows up with his primary cardiology group with consideration for addition of MRA/Entresto following ACE inhibitor washout -Given his advanced age and underlying Parkinson's disease with dementia it is unlikely he would be a candidate for device upgrade to CRT given his underlying LBBB with  cardiomyopathy -CHF education -Daily weights -Strict I's and O's  3.  CAD involving the native coronary arteries with elevated troponin: -Never with symptoms of chest pain -Minimal troponin bump not diagnostic of ACS and likely in the setting of known native vessel CAD with volume overload and bronchitis -Echo as above with stable CM -No plans for inpatient ischemic evaluation at this time given his acute illness, advanced age, and comorbid conditions  -Continue current medical therapy including aspirin, Lipitor, metoprolol, and lisinopril -He does have reported history of melena leading to the discontinuation of Plavix following his 1 year anniversary from PCI with hemoglobin stable this admission thus far  4.  PAF: -Appears to be maintaining sinus rhythm -Remains on PTA metoprolol and amiodarone  -LFT from 11/2018 normal -TSH 2017 normal, follow up with primary  cardiology -Not felt to be a candidate for [place] secondary to falls -CHADS2VASc at least 5 (CHF, HTN, age x 2, vascular disease) -Patient and family have been made aware of CVA risk  5.  Status post PPM: -Device appears to be functioning normally -Follow up with primary cardiologist as directed   6. Bronchitis: -Doxycycline per IM  7. Parkinson's disease with dementia: -PTA medications per IM    For questions or updates, please contact [place] Please consult [email or web address] for contact info under Cardiology/STEMI.    Signed, Christell Faith, PA-C [facility] Pager: [phone] 11/23/2019, 9:26 AM

## 2019-11-23 NOTE — Plan of Care (Signed)
Pt refused to watch emmi videos due to impaired hearing.  Problem: Education: Goal: Knowledge of General Education information will improve Description: Including pain rating scale, medication(s)/side effects and non-pharmacologic comfort measures Outcome: Progressing   Problem: Health Behavior/Discharge Planning: Goal: Ability to manage health-related needs will improve Outcome: Progressing   Problem: Clinical Measurements: Goal: Ability to maintain clinical measurements within normal limits will improve Outcome: Progressing   Problem: Education: Goal: Ability to demonstrate management of disease process will improve Outcome: Progressing Goal: Ability to verbalize understanding of medication therapies will improve Outcome: Progressing

## 2019-11-23 NOTE — Progress Notes (Signed)
Algernon Huxley to be D/C'd Home per MD order.  Discussed prescriptions and follow up appointments with the patient. Prescriptions given to patient, medication list explained in detail. Pt verbalized understanding.  Allergies as of 11/23/2019      Reactions   Phenylephrine-guaifenesin Palpitations   Aspirin Other (See Comments)   Guaifenesin Other (See Comments)   Unknown reaction   Guanfacine    Phenylephrine    Suprax [cefixime]       Medication List    STOP taking these medications   ranitidine 150 MG tablet Commonly known as: ZANTAC     TAKE these medications   amiodarone 200 MG tablet Commonly known as: PACERONE Take 100 mg by mouth daily at 6 (six) AM.   ascorbic acid 500 MG tablet Commonly known as: VITAMIN C Take 500 mg by mouth 3 (three) times daily.   aspirin EC 81 MG tablet Take 81 mg by mouth daily.   atorvastatin 80 MG tablet Commonly known as: LIPITOR Take 80 mg by mouth every evening.   b complex vitamins tablet Take 1 tablet by mouth daily.   carbidopa-levodopa 25-100 MG tablet Commonly known as: SINEMET IR TAKE 1 & 1/2 TABLET BY MOUTH EVERY MORNING TAKE 1 TABLET BY MOUTH THREE TIMES A DAY What changed: See the new instructions.   divalproex 250 MG DR tablet Commonly known as: Depakote Take 1 tablet (250 mg total) by mouth 2 (two) times daily.   docusate sodium 100 MG capsule Commonly known as: COLACE Take 100 mg by mouth 2 (two) times daily.   donepezil 10 MG tablet Commonly known as: ARICEPT TAKE 1 TABLET BY MOUTH AT BEDTIME   doxycycline 100 MG tablet Commonly known as: VIBRA-TABS Take 1 tablet (100 mg total) by mouth every 12 (twelve) hours for 4 days.   famotidine 20 MG tablet Commonly known as: PEPCID Take 20 mg by mouth 2 (two) times daily.   furosemide 40 MG tablet Commonly known as: LASIX Take 1 tablet (40 mg total) by mouth 2 (two) times daily.   lisinopril 5 MG tablet Commonly known as: ZESTRIL Take 5 mg by mouth daily.    meloxicam 7.5 MG tablet Commonly known as: MOBIC TAKE 1 TABLET BY MOUTH ONCE DAILY   memantine 10 MG tablet Commonly known as: NAMENDA TAKE 1 TABLET BY MOUTH TWICE A DAY   metoprolol succinate 25 MG 24 hr tablet Commonly known as: TOPROL-XL Take 25 mg by mouth daily.   mirtazapine 15 MG tablet Commonly known as: REMERON Take 15 mg by mouth at bedtime.   multivitamin-lutein Caps capsule Take 1 capsule by mouth daily.   sertraline 25 MG tablet Commonly known as: ZOLOFT Take 25 mg by mouth daily.   venlafaxine XR 150 MG 24 hr capsule Commonly known as: EFFEXOR-XR Take 150 mg by mouth daily with breakfast.       Vitals:   11/23/19 1151 11/23/19 1417  BP: 106/60 126/71  Pulse: 68 73  Resp: 19 15  Temp: (!) 97.5 F (36.4 C) 98.2 F (36.8 C)  SpO2: 93% 95%    Tele box removed and returned.Skin clean, dry and intact without evidence of skin break down, no evidence of skin tears noted. IV catheter discontinued intact. Site without signs and symptoms of complications. Dressing and pressure applied. Pt denies pain at this time. No complaints noted.  An After Visit Summary was printed and given to the patient. Patient escorted via [place], and D/C home via private auto.  Rolley Sims

## 2019-11-23 NOTE — Discharge Summary (Addendum)
Physician Discharge Summary  Eric Hendricks [id number] DOB: 1924/02/09 DOA: 11/21/2019  PCP: [name], MD  Admit date: 11/21/2019 Discharge date: 11/23/2019  Admitted From: Home Disposition: Home  Recommendations for Outpatient Follow-up:  Follow-up with cardiology in 1 week (has appointment for 3/12) Patient will complete 5-day course of antibiotic (doxycycline) after 3/8.  Home Health none Equipment/Devices: None  Discharge Condition: Fair CODE STATUS: Full code Diet recommendation: Heart Healthy   Discharge Diagnoses:  Principal Problem:   Acute on chronic systolic heart failure (HCC)   Active Problems:   AF (paroxysmal atrial fibrillation) (HCC)   Depression, chronic   Dementia due to Parkinson's disease without behavioral disturbance (HCC) Acute bronchitis  Brief narrative/HPI 86 year old male with history of Parkinson's disease with dementia, CAD, chronic systolic CHF, depression presented with increasing shortness of breath associated with cough with productive green phlegm.  Denies any fevers or chills.  In the ED he was found to have elevated BNP with chest x-ray showing small to moderate bilateral pleural effusion.  O2 sat of high 80s on room air requiring supplemental oxygen.  Admitted for management of acute on chronic systolic CHF and acute bronchitis.   Hospital course  Principal problem Acute on chronic systolic heart failure (HCC) Acute respiratory failure with hypoxia (HCC) Likely triggered by acute bronchitis.  2D echo repeated shows EF of 35-40%  with global hypokinesis.  Started on IV Lasix 40 mg every 12 hours.  Diuresing well and dyspnea markedly improved. sats stable on RA.  Continue metoprolol, lisinopril, aspirin and statin. Thoracentesis not needed as symptoms improved. Patient was supposed to be on Lasix at home but do not see on his home medication list.  Will be discharged on p.o. Lasix 40 mg twice daily. We will discharge home with  home health RN.  Active problems  Acute bronchitis Added oral doxycycline.  Symptoms improved.  Paroxysmal AF (paroxysmal atrial fibrillation) (HCC) Rate controlled.  Continue beta-blocker and amiodarone.  Not on anticoagulation due to falls.  Parkinson disease with dementia Continue Sinemet, Aricept and Namenda  Chronic depression Continue Remeron and Effexor and Zoloft.    Family Communication  :  Son updated on the phone  Disposition Plan  : Home  Consults  : Cardiology  Procedures  : 2D echo   Discharge Instructions   Allergies as of 11/23/2019      Reactions   Phenylephrine-guaifenesin Palpitations   Aspirin Other (See Comments)   Guaifenesin Other (See Comments)   Unknown reaction   Guanfacine    Phenylephrine    Suprax [cefixime]       Medication List    STOP taking these medications   ranitidine 150 MG tablet Commonly known as: ZANTAC     TAKE these medications   amiodarone 200 MG tablet Commonly known as: PACERONE Take 100 mg by mouth daily at 6 (six) AM.   ascorbic acid 500 MG tablet Commonly known as: VITAMIN C Take 500 mg by mouth 3 (three) times daily.   aspirin EC 81 MG tablet Take 81 mg by mouth daily.   atorvastatin 80 MG tablet Commonly known as: LIPITOR Take 80 mg by mouth every evening.   b complex vitamins tablet Take 1 tablet by mouth daily.   carbidopa-levodopa 25-100 MG tablet Commonly known as: SINEMET IR TAKE 1 & 1/2 TABLET BY MOUTH EVERY MORNING TAKE 1 TABLET BY MOUTH THREE TIMES A DAY What changed: See the new instructions.   divalproex 250 MG DR tablet Commonly known as: Depakote  Take 1 tablet (250 mg total) by mouth 2 (two) times daily.   docusate sodium 100 MG capsule Commonly known as: COLACE Take 100 mg by mouth 2 (two) times daily.   donepezil 10 MG tablet Commonly known as: ARICEPT TAKE 1 TABLET BY MOUTH AT BEDTIME   doxycycline 100 MG tablet Commonly known as: VIBRA-TABS Take 1 tablet  (100 mg total) by mouth every 12 (twelve) hours for 4 days.   famotidine 20 MG tablet Commonly known as: PEPCID Take 20 mg by mouth 2 (two) times daily.   furosemide 40 MG tablet Commonly known as: LASIX Take 1 tablet (40 mg total) by mouth 2 (two) times daily.   lisinopril 5 MG tablet Commonly known as: ZESTRIL Take 5 mg by mouth daily.   meloxicam 7.5 MG tablet Commonly known as: MOBIC TAKE 1 TABLET BY MOUTH ONCE DAILY   memantine 10 MG tablet Commonly known as: NAMENDA TAKE 1 TABLET BY MOUTH TWICE A DAY   metoprolol succinate 25 MG 24 hr tablet Commonly known as: TOPROL-XL Take 25 mg by mouth daily.   mirtazapine 15 MG tablet Commonly known as: REMERON Take 15 mg by mouth at bedtime.   multivitamin-lutein Caps capsule Take 1 capsule by mouth daily.   sertraline 25 MG tablet Commonly known as: ZOLOFT Take 25 mg by mouth daily.   venlafaxine XR 150 MG 24 hr capsule Commonly known as: EFFEXOR-XR Take 150 mg by mouth daily with breakfast.      Follow-up Information    [facility] Follow up on 11/30/2019.   Specialty: Cardiology Why: at 10:30am. Enter through the [place] entrance Contact information: [address] [place] [phone]       [name], MD. Schedule an appointment as soon as possible for a visit in 2 week(s).   Specialty: Family Medicine Contact information: [id number] [place] [address] [phone]          Allergies  Allergen Reactions  . Phenylephrine-Guaifenesin Palpitations  . Aspirin Other (See Comments)  . Guaifenesin Other (See Comments)    Unknown reaction  . Guanfacine   . Phenylephrine   . Suprax [Cefixime]       Procedures/Studies: DG Chest Port 1 View  Result Date: 11/21/2019 CLINICAL DATA:  Chest pain EXAM: PORTABLE CHEST 1 VIEW COMPARISON:  12/14/2018 FINDINGS: Left-sided implanted cardiac device  in stable positioning. Mild cardiomegaly. Calcific aortic knob. Small to moderate right-sided pleural effusion. Possible trace left pleural effusion. Hazy bibasilar opacities. No pneumothorax. IMPRESSION: 1. Small to moderate right-sided pleural effusion. Possible trace left pleural effusion. 2. Associated bibasilar opacities may reflect a combination of atelectasis and/or pneumonia. Electronically Signed   By: Davina Poke D.O.   On: 11/21/2019 12:47   ECHOCARDIOGRAM COMPLETE  Result Date: 11/22/2019    ECHOCARDIOGRAM REPORT   Patient Name:   Eric Hendricks Date of Exam: 11/22/2019 Medical Rec #:  [id number]    Height:       69.0 in Accession #:    [id number]   Weight:       145.9 lb Date of Birth:  [birth year]    BSA:          1.807 m Patient Age:    86 years     BP:           142/71 mmHg Patient Gender: M            HR:  67 bpm. Exam Location:  [facility] Procedure: 2D Echo, Color Doppler and Cardiac Doppler Indications:     CHF-acute systolic [id number]  History:         Patient has no prior history of Echocardiogram examinations.                  CHF. Dementia.  Sonographer:     [name] RDCS (AE) Referring Phys:  [place] Diagnosing Phys: Ida Rogue MD IMPRESSIONS  1. Left ventricular ejection fraction, by estimation, is 35 to 40%. The left ventricle has moderately decreased function. The left ventricle demonstrates global hypokinesis. There is mild left ventricular hypertrophy. Left ventricular diastolic parameters are consistent with Grade II diastolic dysfunction (pseudonormalization).  2. Right ventricular systolic function is normal. The right ventricular size is normal. There is normal pulmonary artery systolic pressure.  3. Left atrial size was mildly dilated.  4. The aortic valve is moderately calcified. Aortic valve regurgitation is trivial. Visually appears to have mild aortic valve stenosis, gradient likely underestimated secondary to depressed EF. FINDINGS  Left Ventricle: Left  ventricular ejection fraction, by estimation, is 35 to 40%. The left ventricle has moderately decreased function. The left ventricle demonstrates global hypokinesis. The left ventricular internal cavity size was normal in size. There is mild left ventricular hypertrophy. Left ventricular diastolic parameters are consistent with Grade II diastolic dysfunction (pseudonormalization). Right Ventricle: The right ventricular size is normal. No increase in right ventricular wall thickness. Right ventricular systolic function is normal. There is normal pulmonary artery systolic pressure. The tricuspid regurgitant velocity is 1.95 m/s, and  with an assumed right atrial pressure of 10 mmHg, the estimated right ventricular systolic pressure is [id number] mmHg. Left Atrium: Left atrial size was mildly dilated. Right Atrium: Right atrial size was normal in size. Pericardium: There is no evidence of pericardial effusion. Mitral Valve: The mitral valve is normal in structure and function. Normal mobility of the mitral valve leaflets. Trivial mitral valve regurgitation. No evidence of mitral valve stenosis. Tricuspid Valve: The tricuspid valve is normal in structure. Tricuspid valve regurgitation is not demonstrated. No evidence of tricuspid stenosis. Aortic Valve: The aortic valve is normal in structure and function. Aortic valve regurgitation is trivial. Mild aortic stenosis is present. Aortic valve mean gradient measures 5.0 mmHg. Aortic valve peak gradient measures 7.7 mmHg. Aortic valve area, by VTI measures 1.70 cm. Pulmonic Valve: The pulmonic valve was normal in structure. Pulmonic valve regurgitation is not visualized. No evidence of pulmonic stenosis. Aorta: The aortic root is normal in size and structure. Venous: The inferior vena cava is normal in size with greater than 50% respiratory variability, suggesting right atrial pressure of 3 mmHg. IAS/Shunts: No atrial level shunt detected by color flow Doppler. Additional  Comments: A pacer wire is visualized.  LEFT VENTRICLE PLAX 2D LVIDd:         4.18 cm     Diastology LVIDs:         3.43 cm     LV e' lateral:   5.44 cm/s LV PW:         1.24 cm     LV E/e' lateral: 9.1 LV IVS:        1.05 cm     LV e' medial:    3.05 cm/s LVOT diam:     2.25 cm     LV E/e' medial:  16.2 LV SV:         45 LV SV Index:   25 LVOT  Area:     3.98 cm  LV Volumes (MOD) LV vol d, MOD A2C: 71.7 ml LV vol d, MOD A4C: 81.2 ml LV vol s, MOD A2C: 41.9 ml LV vol s, MOD A4C: 51.7 ml LV SV MOD A2C:     29.8 ml LV SV MOD A4C:     81.2 ml LV SV MOD BP:      30.1 ml RIGHT VENTRICLE RV Basal diam:  3.74 cm RV S prime:     8.59 cm/s TAPSE (M-mode): 2.8 cm LEFT ATRIUM             Index       RIGHT ATRIUM           Index LA diam:        4.20 cm 2.32 cm/m  RA Area:     14.70 cm LA Vol (A2C):   43.0 ml 23.80 ml/m RA Volume:   36.40 ml  20.15 ml/m LA Vol (A4C):   26.9 ml 14.89 ml/m LA Biplane Vol: 34.6 ml 19.15 ml/m  AORTIC VALVE                    PULMONIC VALVE AV Area (Vmax):    1.20 cm     PV Vmax:        0.43 m/s AV Area (Vmean):   1.24 cm     PV Peak grad:   0.7 mmHg AV Area (VTI):     1.70 cm     RVOT Peak grad: 2 mmHg AV Vmax:           139.00 cm/s AV Vmean:          101.433 cm/s AV VTI:            0.267 m AV Peak Grad:      7.7 mmHg AV Mean Grad:      5.0 mmHg LVOT Vmax:         42.10 cm/s LVOT Vmean:        31.600 cm/s LVOT VTI:          0.114 m LVOT/AV VTI ratio: 0.43  AORTA Ao Root diam: 3.30 cm MITRAL VALVE               TRICUSPID VALVE MV Area (PHT): 2.08 cm    TR Peak grad:   15.2 mmHg MV Decel Time: 364 msec    TR Vmax:        195.00 cm/s MV E velocity: 49.50 cm/s MV A velocity: 32.00 cm/s  SHUNTS MV E/A ratio:  1.55        Systemic VTI:  0.11 m                            Systemic Diam: 2.25 cm Ida Rogue MD Electronically signed by Ida Rogue MD Signature Date/Time: 11/22/2019/5:15:27 PM    Final       Subjective: Reports breathing better.  Poor historian due to dementia.  Discharge  Exam: Vitals:   11/23/19 0732 11/23/19 0852  BP: (!) 102/56 103/64  Pulse: 63 70  Resp: 19   Temp: (!) 97.5 F (36.4 C)   SpO2: 95% 92%   Vitals:   11/23/19 0401 11/23/19 0406 11/23/19 0732 11/23/19 0852  BP: 116/63  (!) 102/56 103/64  Pulse: 76  63 70  Resp: 18  19   Temp: 97.8 F (36.6 C)  (!) 97.5 F (36.4 C)  TempSrc: Oral  Oral   SpO2: 94%  95% 92%  Weight:  66.3 kg    Height:        General: Not in distress HEENT: Moist mucosa, supple Chest: Bibasilar breath sound CVs: Normal S1-S2, no murmurs GI: Soft, nondistended, nontender Musculoskeletal: Warm, no edema    The results of significant diagnostics from this hospitalization (including imaging, microbiology, ancillary and laboratory) are listed below for reference.     Microbiology: Recent Results (from the past 240 hour(s))  Respiratory Panel by RT PCR (Flu A&B, Covid) - Nasopharyngeal Swab     Status: None   Collection Time: 11/21/19 12:25 PM   Specimen: Nasopharyngeal Swab  Result Value Ref Range Status   SARS Coronavirus 2 by RT PCR NEGATIVE NEGATIVE Final    Comment: (NOTE) SARS-CoV-2 target nucleic acids are NOT DETECTED. The SARS-CoV-2 RNA is generally detectable in upper respiratoy specimens during the acute phase of infection. The lowest concentration of SARS-CoV-2 viral copies this assay can detect is 131 copies/mL. A negative result does not preclude SARS-Cov-2 infection and should not be used as the sole basis for treatment or other patient management decisions. A negative result may occur with  improper specimen collection/handling, submission of specimen other than nasopharyngeal swab, presence of viral mutation(s) within the areas targeted by this assay, and inadequate number of viral copies (<131 copies/mL). A negative result must be combined with clinical observations, patient history, and epidemiological information. The expected result is Negative. Fact Sheet for Patients:   PinkCheek.be Fact Sheet for Healthcare Providers:  GravelBags.it This test is not yet ap proved or cleared by the [place] FDA and  has been authorized for detection and/or diagnosis of SARS-CoV-2 by FDA under an Emergency Use Authorization (EUA). This EUA will remain  in effect (meaning this test can be used) for the duration of the COVID-19 declaration under Section 564(b)(1) of the Act, 21 U.S.C. section 360bbb-3(b)(1), unless the authorization is terminated or revoked sooner.    Influenza A by PCR NEGATIVE NEGATIVE Final   Influenza B by PCR NEGATIVE NEGATIVE Final    Comment: (NOTE) The Xpert Xpress SARS-CoV-2/FLU/RSV assay is intended as an aid in  the diagnosis of influenza from Nasopharyngeal swab specimens and  should not be used as a sole basis for treatment. Nasal washings and  aspirates are unacceptable for Xpert Xpress SARS-CoV-2/FLU/RSV  testing. Fact Sheet for Patients: PinkCheek.be Fact Sheet for Healthcare Providers: GravelBags.it This test is not yet approved or cleared by the [place] FDA and  has been authorized for detection and/or diagnosis of SARS-CoV-2 by  FDA under an Emergency Use Authorization (EUA). This EUA will remain  in effect (meaning this test can be used) for the duration of the  Covid-19 declaration under Section 564(b)(1) of the Act, 21  U.S.C. section 360bbb-3(b)(1), unless the authorization is  terminated or revoked. Performed at [facility], [address] [place], [place] [address]      Labs: BNP (last 3 results) Recent Labs    11/21/19 1225  BNP [id number]*   Basic Metabolic Panel: Recent Labs  Lab 11/21/19 1225 11/22/19 0433 11/23/19 0802  NA 143 142 141  K 4.4 4.0 3.8  CL 105 102 102  CO2 26 26 30   GLUCOSE 122* 101* 102*  BUN 20 23 26*  CREATININE 1.03 1.19 1.18  CALCIUM 8.9 8.4* 8.3*    Liver Function Tests: No results for input(s): AST, ALT, ALKPHOS, BILITOT, PROT, ALBUMIN in the last 168 hours.  No results for input(s): LIPASE, AMYLASE in the last 168 hours. No results for input(s): AMMONIA in the last 168 hours. CBC: Recent Labs  Lab 11/21/19 1225  WBC 4.6  NEUTROABS 3.4  HGB 15.4  HCT 47.4  MCV 99.2  PLT 141*   Cardiac Enzymes: No results for input(s): CKTOTAL, CKMB, CKMBINDEX, TROPONINI in the last 168 hours. BNP: Invalid input(s): POCBNP CBG: No results for input(s): GLUCAP in the last 168 hours. D-Dimer No results for input(s): DDIMER in the last 72 hours. Hgb A1c No results for input(s): HGBA1C in the last 72 hours. Lipid Profile No results for input(s): CHOL, HDL, LDLCALC, TRIG, CHOLHDL, LDLDIRECT in the last 72 hours. Thyroid function studies No results for input(s): TSH, T4TOTAL, T3FREE, THYROIDAB in the last 72 hours.  Invalid input(s): FREET3 Anemia work up No results for input(s): VITAMINB12, FOLATE, FERRITIN, TIBC, IRON, RETICCTPCT in the last 72 hours. Urinalysis No results found for: COLORURINE, APPEARANCEUR, [place], [place], [place], [place], [place], [place], PROTEINUR, UROBILINOGEN, NITRITE, LEUKOCYTESUR Sepsis Labs Invalid input(s): PROCALCITONIN,  WBC,  LACTICIDVEN Microbiology Recent Results (from the past 240 hour(s))  Respiratory Panel by RT PCR (Flu A&B, Covid) - Nasopharyngeal Swab     Status: None   Collection Time: 11/21/19 12:25 PM   Specimen: Nasopharyngeal Swab  Result Value Ref Range Status   SARS Coronavirus 2 by RT PCR NEGATIVE NEGATIVE Final    Comment: (NOTE) SARS-CoV-2 target nucleic acids are NOT DETECTED. The SARS-CoV-2 RNA is generally detectable in upper respiratoy specimens during the acute phase of infection. The lowest concentration of SARS-CoV-2 viral copies this assay can detect is 131 copies/mL. A negative result does not preclude SARS-Cov-2 infection and should not be used as the sole basis  for treatment or other patient management decisions. A negative result may occur with  improper specimen collection/handling, submission of specimen other than nasopharyngeal swab, presence of viral mutation(s) within the areas targeted by this assay, and inadequate number of viral copies (<131 copies/mL). A negative result must be combined with clinical observations, patient history, and epidemiological information. The expected result is Negative. Fact Sheet for Patients:  PinkCheek.be Fact Sheet for Healthcare Providers:  GravelBags.it This test is not yet ap proved or cleared by the [place] FDA and  has been authorized for detection and/or diagnosis of SARS-CoV-2 by FDA under an Emergency Use Authorization (EUA). This EUA will remain  in effect (meaning this test can be used) for the duration of the COVID-19 declaration under Section 564(b)(1) of the Act, 21 U.S.C. section 360bbb-3(b)(1), unless the authorization is terminated or revoked sooner.    Influenza A by PCR NEGATIVE NEGATIVE Final   Influenza B by PCR NEGATIVE NEGATIVE Final    Comment: (NOTE) The Xpert Xpress SARS-CoV-2/FLU/RSV assay is intended as an aid in  the diagnosis of influenza from Nasopharyngeal swab specimens and  should not be used as a sole basis for treatment. Nasal washings and  aspirates are unacceptable for Xpert Xpress SARS-CoV-2/FLU/RSV  testing. Fact Sheet for Patients: PinkCheek.be Fact Sheet for Healthcare Providers: GravelBags.it This test is not yet approved or cleared by the [place] FDA and  has been authorized for detection and/or diagnosis of SARS-CoV-2 by  FDA under an Emergency Use Authorization (EUA). This EUA will remain  in effect (meaning this test can be used) for the duration of the  Covid-19 declaration under Section 564(b)(1) of the Act, 21  U.S.C.  section 360bbb-3(b)(1), unless the authorization is  terminated or revoked. Performed at [facility], [address], [place], [place] [address]      Time coordinating discharge: 35 minutes  SIGNED:   Louellen Molder, MD  [facility] 11/23/2019, 11:30 AM Pager   If 7PM-7AM, please contact night-coverage [email or web address] Password [email or web address]

## 2019-11-27 ENCOUNTER — Telehealth: Payer: Self-pay | Admitting: Family

## 2019-11-27 NOTE — Telephone Encounter (Signed)
Spoke with patients son who stated patient is doing well however he already has a cardiologist and doesn't feel he needs to see [place] so asked to cancel the appointment we made after his recent hospital d/c for the [facility].    Alyse Low, [place]

## 2019-11-30 ENCOUNTER — Ambulatory Visit: Payer: Medicare Other | Admitting: Family

## 2020-02-22 NOTE — Progress Notes (Signed)
Assessment/Plan:   1. Parkinsonism, could be idiopathic but wonder if not vascular  -would like to be only one doing meds - currently nurses from [facility] are changing the meds and more falls  -no hallucinations and more falls so restart carbidopa/levodopa 25/100 at 8am/noon/4pm (prior 1.5 tables/1/1/1)  2. Dementia  -On donepezil and memantine.  -On Depakote,  I put him on 250 mg bid but nurses making house calls changed him to tid.    -Patient living in assisted living but it is independent, but there is med management. As with multiple other visits, we discussed that patient needs higher level of care.  He has moved but still probably needs 24 hour care  3. B12 deficiency  -Unclear if he is on a B12 supplement.   Subjective:   Eric Hendricks was seen today in follow up for parkinsonism.  My previous records were reviewed prior to todays visit as well as outside records available to me. Pt has had several falls but son hasn't gotten details of those falls.  Son thinks a lot more instability since our last visit.  Son doesn't know if doing PT but thinks that he is.  Pt denies lightheadedness, near syncope. Last visit, the patient was having some behavioral disturbances and inappropriate behavior with women. We initiated Depakote, 250 mg twice per day. They report that the nurses that are making house calls are changing his meds and he is now on tid dosing of the depakote. He has moved since our last visit from [facility] assisted living to [facility]. Current level of care is "level 1" per son but he doesn't know what that means.  He does know it is independent.  They give meds.  He was getting psychotherapy.  Pt still having mood issues with son - angry at times but son states that he is good with the staff and staff states that he is very sweet.    Current prescribed movement disorder medications: Carbidopa/levodopa 25/100, 1.5 tablets in the morning followed by 3 other  single tablets throughout the day (not how being given by doctors making house calls - they changed to 1.5 tablets in the AM only) Memantine, 10 mg twice per day Donepezil, 10 mg daily Depakote, 250 mg twice per day  PREVIOUS MEDICATIONS:  clonazepam (not sure where got dropped or why, but did well off of it from an RBD standpoint)  ALLERGIES:   Allergies  Allergen Reactions  . Phenylephrine-Guaifenesin Palpitations  . Aspirin Other (See Comments)  . Guaifenesin Other (See Comments)    Unknown reaction  . Guanfacine   . Phenylephrine   . Suprax [Cefixime]     CURRENT MEDICATIONS:  Outpatient Encounter Medications as of 02/25/2020  Medication Sig  . amiodarone (PACERONE) 200 MG tablet Take 100 mg by mouth daily at 6 (six) AM.  . ascorbic acid (VITAMIN C) 500 MG tablet Take 500 mg by mouth 3 (three) times daily.  Marland Kitchen aspirin EC 81 MG tablet Take 81 mg by mouth daily.  Marland Kitchen atorvastatin (LIPITOR) 80 MG tablet Take 80 mg by mouth every evening.  Marland Kitchen b complex vitamins tablet Take 1 tablet by mouth daily.  . carbidopa-levodopa (SINEMET IR) 25-100 MG tablet TAKE 1 & 1/2 TABLET BY MOUTH EVERY MORNING TAKE 1 TABLET BY MOUTH THREE TIMES A DAY (Patient taking differently: Take 1.5 tablets by mouth every morning. )  . divalproex (DEPAKOTE) 250 MG DR tablet Take 1 tablet (250 mg total) by  mouth 2 (two) times daily. (Patient taking differently: Take 250 mg by mouth 3 (three) times daily. )  . docusate sodium (COLACE) 100 MG capsule Take 100 mg by mouth 2 (two) times daily.  Marland Kitchen donepezil (ARICEPT) 10 MG tablet TAKE 1 TABLET BY MOUTH AT BEDTIME (Patient taking differently: Take 10 mg by mouth at bedtime. )  . famotidine (PEPCID) 20 MG tablet Take 20 mg by mouth 2 (two) times daily.  . furosemide (LASIX) 20 MG tablet Take 20 mg by mouth daily.  . furosemide (LASIX) 40 MG tablet Take 1 tablet (40 mg total) by mouth 2 (two) times daily.  Marland Kitchen lisinopril (PRINIVIL,ZESTRIL) 5 MG tablet Take 5 mg by mouth daily.  .  meloxicam (MOBIC) 7.5 MG tablet TAKE 1 TABLET BY MOUTH ONCE DAILY  . memantine (NAMENDA) 10 MG tablet TAKE 1 TABLET BY MOUTH TWICE A DAY (Patient taking differently: Take 10 mg by mouth 2 (two) times daily. )  . metoprolol succinate (TOPROL-XL) 25 MG 24 hr tablet Take 25 mg by mouth daily.  . mirtazapine (REMERON) 15 MG tablet Take 15 mg by mouth at bedtime.  . multivitamin-lutein (OCUVITE-LUTEIN) CAPS capsule Take 1 capsule by mouth daily.  . sertraline (ZOLOFT) 50 MG tablet Take 50 mg by mouth daily.  . sertraline (ZOLOFT) 25 MG tablet Take 25 mg by mouth daily.  Marland Kitchen venlafaxine XR (EFFEXOR-XR) 150 MG 24 hr capsule Take 150 mg by mouth daily with breakfast.   No facility-administered encounter medications on file as of 02/25/2020.    Objective:   PHYSICAL EXAMINATION:    VITALS:   Vitals:   02/25/20 1418  BP: 119/75  Pulse: 80  Resp: 20  SpO2: 90%  Weight: 142 lb (64.4 kg)    GEN:  The patient appears stated age and is in NAD. HEENT:  Normocephalic, atraumatic.  The mucous membranes are moist. The superficial temporal arteries are without ropiness or tenderness. CV:  RRR Lungs:  CTAB.  There is DOE Neck/HEME:  There are no carotid bruits bilaterally.  Neurological examination:  Orientation: The patient is alert and oriented x2 Cranial nerves: There is good facial symmetry with facial hypomimia. The speech is fluent and clear. Soft palate rises symmetrically and there is no tongue deviation. Hearing is intact to conversational tone. Sensation: Sensation is intact to light touch throughout Motor: Strength is at least antigravity x4.  Movement examination: Tone: There is normal tone in the UE/LE.  He has paratonia (gegenhalten) Abnormal movements: none Coordination:  There is apraxia with these commands Gait and Station: The patient pushes off to arise.  He does well with the walker   I have reviewed and interpreted the following labs independently    Chemistry        Component Value Date/Time   NA 141 11/23/2019 0802   K 3.8 11/23/2019 0802   CL 102 11/23/2019 0802   CO2 30 11/23/2019 0802   BUN 26 (H) 11/23/2019 0802   CREATININE 1.18 11/23/2019 0802      Component Value Date/Time   CALCIUM 8.3 (L) 11/23/2019 0802       Lab Results  Component Value Date   WBC 4.6 11/21/2019   HGB 15.4 11/21/2019   HCT 47.4 11/21/2019   MCV 99.2 11/21/2019   PLT 141 (L) 11/21/2019    No results found for: TSH   Total time spent on today's visit was 30 minutes, including both face-to-face time and nonface-to-face time.  Time included that spent on review of  records (prior notes available to me/labs/imaging if pertinent), discussing treatment and goals, answering patient's questions and coordinating care.  Cc:  [name], MD

## 2020-02-25 ENCOUNTER — Encounter: Payer: Self-pay | Admitting: Neurology

## 2020-02-25 ENCOUNTER — Ambulatory Visit (INDEPENDENT_AMBULATORY_CARE_PROVIDER_SITE_OTHER): Payer: Medicare Other | Admitting: Neurology

## 2020-02-25 ENCOUNTER — Other Ambulatory Visit: Payer: Self-pay

## 2020-02-25 VITALS — BP 119/75 | HR 80 | Resp 20 | Wt 142.0 lb

## 2020-02-25 DIAGNOSIS — G20 Parkinson's disease: Secondary | ICD-10-CM | POA: Diagnosis not present

## 2020-02-25 DIAGNOSIS — F0281 Dementia in other diseases classified elsewhere with behavioral disturbance: Secondary | ICD-10-CM

## 2020-02-25 DIAGNOSIS — F02818 Dementia in other diseases classified elsewhere, unspecified severity, with other behavioral disturbance: Secondary | ICD-10-CM

## 2020-02-25 DIAGNOSIS — G20A1 Parkinson's disease without dyskinesia, without mention of fluctuations: Secondary | ICD-10-CM

## 2020-02-25 NOTE — Patient Instructions (Signed)
1.  Take carbidopa/levodopa 25/100,  1 tablet at 8am/noon/4pm.   2.  Continue donepezil, 10 mg daily  3.  Continue memantine, 10 mg twice per day 4.  Do NOT change meds from neurology unless we are notified 5.  Look into higher level of care  The physicians and staff at [facility] are committed to providing excellent care. You may receive a survey requesting feedback about your experience at our office. We strive to receive "very good" responses to the survey questions. If you feel that your experience would prevent you from giving the office a "very good " response, please contact our office to try to remedy the situation. We may be reached at [phone]. Thank you for taking the time out of your busy day to complete the survey.

## 2020-05-08 ENCOUNTER — Emergency Department
Admission: EM | Admit: 2020-05-08 | Discharge: 2020-05-08 | Disposition: A | Discharge status: Home / Self Care | Payer: Medicare Other | Source: Home / Self Care

## 2020-05-08 ENCOUNTER — Other Ambulatory Visit: Payer: Self-pay

## 2020-05-08 ENCOUNTER — Emergency Department: Payer: Medicare Other

## 2020-05-08 DIAGNOSIS — Z5321 Procedure and treatment not carried out due to patient leaving prior to being seen by health care provider: Secondary | ICD-10-CM | POA: Insufficient documentation

## 2020-05-08 DIAGNOSIS — R0602 Shortness of breath: Secondary | ICD-10-CM | POA: Insufficient documentation

## 2020-05-08 DIAGNOSIS — I5043 Acute on chronic combined systolic (congestive) and diastolic (congestive) heart failure: Secondary | ICD-10-CM | POA: Diagnosis not present

## 2020-05-08 DIAGNOSIS — J90 Pleural effusion, not elsewhere classified: Secondary | ICD-10-CM | POA: Diagnosis not present

## 2020-05-08 LAB — COMPREHENSIVE METABOLIC PANEL
ALT: 6 U/L (ref 0–44)
AST: 19 U/L (ref 15–41)
Albumin: 3.7 g/dL (ref 3.5–5.0)
Alkaline Phosphatase: 55 U/L (ref 38–126)
Anion gap: 10 (ref 5–15)
BUN: 30 mg/dL — ABNORMAL HIGH (ref 8–23)
CO2: 25 mmol/L (ref 22–32)
Calcium: 8.4 mg/dL — ABNORMAL LOW (ref 8.9–10.3)
Chloride: 104 mmol/L (ref 98–111)
Creatinine, Ser: 1.31 mg/dL — ABNORMAL HIGH (ref 0.61–1.24)
GFR calc Af Amer: 54 mL/min — ABNORMAL LOW (ref >60)
GFR calc non Af Amer: 46 mL/min — ABNORMAL LOW (ref >60)
Glucose, Bld: 110 mg/dL — ABNORMAL HIGH (ref 70–99)
Potassium: 4.5 mmol/L (ref 3.5–5.1)
Sodium: 139 mmol/L (ref 135–145)
Total Bilirubin: 0.6 mg/dL (ref 0.3–1.2)
Total Protein: 6.8 g/dL (ref 6.5–8.1)

## 2020-05-08 LAB — CBC
HCT: 42.3 % (ref 39.0–52.0)
Hemoglobin: 14 g/dL (ref 13.0–17.0)
MCH: 32 pg (ref 26.0–34.0)
MCHC: 33.1 g/dL (ref 30.0–36.0)
MCV: 96.8 fL (ref 80.0–100.0)
Platelets: 143 10*3/uL — ABNORMAL LOW (ref 150–400)
RBC: 4.37 MIL/uL (ref 4.22–5.81)
RDW: 15.4 % (ref 11.5–15.5)
WBC: 5 10*3/uL (ref 4.0–10.5)
nRBC: 0 % (ref 0.0–0.2)

## 2020-05-08 NOTE — ED Triage Notes (Signed)
BIB [facility] from facility c/o SOB. Reports ongoing X 1 week. Pt wearing 2L [place] at this time placed by EMS, denies pain. A&0X4

## 2020-05-08 NOTE — ED Triage Notes (Signed)
Pt in via EMS from [facility] with c/o SOB. EMS reports per staff pt has some difficulty breathing with sats of 49-88%. EMS placed pt on 2L and is currently 96%. Diminished lung sounds on the left side. Denies pain. Pt reports trouble breathing fo the last week.

## 2020-05-09 ENCOUNTER — Inpatient Hospital Stay
Admission: EM | Admit: 2020-05-09 | Discharge: 2020-05-11 | DRG: 291 | Disposition: A | Discharge status: Home Health | Payer: Medicare Other | Attending: Internal Medicine | Admitting: Internal Medicine

## 2020-05-09 ENCOUNTER — Other Ambulatory Visit: Payer: Self-pay

## 2020-05-09 ENCOUNTER — Emergency Department: Payer: Medicare Other

## 2020-05-09 DIAGNOSIS — N179 Acute kidney failure, unspecified: Secondary | ICD-10-CM | POA: Diagnosis present

## 2020-05-09 DIAGNOSIS — F0280 Dementia in other diseases classified elsewhere without behavioral disturbance: Secondary | ICD-10-CM | POA: Diagnosis present

## 2020-05-09 DIAGNOSIS — Z9049 Acquired absence of other specified parts of digestive tract: Secondary | ICD-10-CM

## 2020-05-09 DIAGNOSIS — R5381 Other malaise: Secondary | ICD-10-CM

## 2020-05-09 DIAGNOSIS — J918 Pleural effusion in other conditions classified elsewhere: Secondary | ICD-10-CM | POA: Diagnosis present

## 2020-05-09 DIAGNOSIS — Z79899 Other long term (current) drug therapy: Secondary | ICD-10-CM

## 2020-05-09 DIAGNOSIS — E785 Hyperlipidemia, unspecified: Secondary | ICD-10-CM | POA: Diagnosis present

## 2020-05-09 DIAGNOSIS — I480 Paroxysmal atrial fibrillation: Secondary | ICD-10-CM | POA: Diagnosis present

## 2020-05-09 DIAGNOSIS — Z791 Long term (current) use of non-steroidal anti-inflammatories (NSAID): Secondary | ICD-10-CM

## 2020-05-09 DIAGNOSIS — I2510 Atherosclerotic heart disease of native coronary artery without angina pectoris: Secondary | ICD-10-CM | POA: Diagnosis present

## 2020-05-09 DIAGNOSIS — Z8546 Personal history of malignant neoplasm of prostate: Secondary | ICD-10-CM

## 2020-05-09 DIAGNOSIS — R0602 Shortness of breath: Secondary | ICD-10-CM

## 2020-05-09 DIAGNOSIS — Z7982 Long term (current) use of aspirin: Secondary | ICD-10-CM

## 2020-05-09 DIAGNOSIS — Z9079 Acquired absence of other genital organ(s): Secondary | ICD-10-CM

## 2020-05-09 DIAGNOSIS — J9601 Acute respiratory failure with hypoxia: Secondary | ICD-10-CM | POA: Diagnosis present

## 2020-05-09 DIAGNOSIS — G20 Parkinson's disease: Secondary | ICD-10-CM | POA: Diagnosis present

## 2020-05-09 DIAGNOSIS — I5023 Acute on chronic systolic (congestive) heart failure: Secondary | ICD-10-CM | POA: Diagnosis present

## 2020-05-09 DIAGNOSIS — Z20822 Contact with and (suspected) exposure to covid-19: Secondary | ICD-10-CM | POA: Diagnosis present

## 2020-05-09 DIAGNOSIS — F329 Major depressive disorder, single episode, unspecified: Secondary | ICD-10-CM | POA: Diagnosis present

## 2020-05-09 DIAGNOSIS — J90 Pleural effusion, not elsewhere classified: Secondary | ICD-10-CM

## 2020-05-09 DIAGNOSIS — I959 Hypotension, unspecified: Secondary | ICD-10-CM | POA: Diagnosis present

## 2020-05-09 DIAGNOSIS — I5043 Acute on chronic combined systolic (congestive) and diastolic (congestive) heart failure: Principal | ICD-10-CM | POA: Diagnosis present

## 2020-05-09 LAB — CBC WITH DIFFERENTIAL/PLATELET
Abs Immature Granulocytes: 0.02 10*3/uL (ref 0.00–0.07)
Basophils Absolute: 0.1 10*3/uL (ref 0.0–0.1)
Basophils Relative: 1 %
Eosinophils Absolute: 0.2 10*3/uL (ref 0.0–0.5)
Eosinophils Relative: 3 %
HCT: 42.7 % (ref 39.0–52.0)
Hemoglobin: 13.7 g/dL (ref 13.0–17.0)
Immature Granulocytes: 0 %
Lymphocytes Relative: 11 %
Lymphs Abs: 0.7 10*3/uL (ref 0.7–4.0)
MCH: 31.6 pg (ref 26.0–34.0)
MCHC: 32.1 g/dL (ref 30.0–36.0)
MCV: 98.6 fL (ref 80.0–100.0)
Monocytes Absolute: 0.5 10*3/uL (ref 0.1–1.0)
Monocytes Relative: 9 %
Neutro Abs: 4.4 10*3/uL (ref 1.7–7.7)
Neutrophils Relative %: 76 %
Platelets: 141 10*3/uL — ABNORMAL LOW (ref 150–400)
RBC: 4.33 MIL/uL (ref 4.22–5.81)
RDW: 15.4 % (ref 11.5–15.5)
WBC: 5.8 10*3/uL (ref 4.0–10.5)
nRBC: 0 % (ref 0.0–0.2)

## 2020-05-09 LAB — COMPREHENSIVE METABOLIC PANEL
ALT: 10 U/L (ref 0–44)
AST: 19 U/L (ref 15–41)
Albumin: 3.9 g/dL (ref 3.5–5.0)
Alkaline Phosphatase: 56 U/L (ref 38–126)
Anion gap: 11 (ref 5–15)
BUN: 34 mg/dL — ABNORMAL HIGH (ref 8–23)
CO2: 28 mmol/L (ref 22–32)
Calcium: 8.5 mg/dL — ABNORMAL LOW (ref 8.9–10.3)
Chloride: 103 mmol/L (ref 98–111)
Creatinine, Ser: 1.48 mg/dL — ABNORMAL HIGH (ref 0.61–1.24)
GFR calc Af Amer: 46 mL/min — ABNORMAL LOW (ref >60)
GFR calc non Af Amer: 40 mL/min — ABNORMAL LOW (ref >60)
Glucose, Bld: 92 mg/dL (ref 70–99)
Potassium: 4.4 mmol/L (ref 3.5–5.1)
Sodium: 142 mmol/L (ref 135–145)
Total Bilirubin: 0.6 mg/dL (ref 0.3–1.2)
Total Protein: 7.2 g/dL (ref 6.5–8.1)

## 2020-05-09 LAB — PROTIME-INR
INR: 1 (ref 0.8–1.2)
Prothrombin Time: 12.6 seconds (ref 11.4–15.2)

## 2020-05-09 LAB — LACTIC ACID, PLASMA: Lactic Acid, Venous: 1.6 mmol/L (ref 0.5–1.9)

## 2020-05-09 LAB — APTT: aPTT: 29 seconds (ref 24–36)

## 2020-05-09 LAB — TROPONIN I (HIGH SENSITIVITY): Troponin I (High Sensitivity): 28 ng/L — ABNORMAL HIGH (ref <18)

## 2020-05-09 NOTE — ED Provider Notes (Signed)
[facility] Emergency Department Provider Note   ____________________________________________   First MD Initiated Contact with Patient 05/09/20 2305     (approximate)  I have reviewed the triage vital signs and the nursing notes.   HISTORY  Chief Complaint Shortness of Breath  EM caveat: Some limitation due to dementia and poor historian very hard of hearing  HPI Tyshaun Vinzant is a 86 y.o. male history of CHF, Parkinson's disease, dementia  Presents for shortness of breath.  Ongoing for a couple of days.  Seen had a chest x-ray yesterday but not seen by physician.  Returns today, patient reports he is here because he is just not feeling as young as he used to be and reports he just has the aches of being an old man.  Does report slight feeling of shortness of breath but no pain     Past Medical History:  Diagnosis Date   B12 deficiency    CHF (congestive heart failure) (HCC)    Dementia (HCC)    Depression    Hyperlipemia    Parkinson's disease ([place])    Prostate cancer [facility])     Patient Active Problem List   Diagnosis Date Noted   Hypoxia 05/10/2020   Pleural effusion, right 05/09/2020   Acute on chronic systolic heart failure ([place]) 11/21/2019   AF (paroxysmal atrial fibrillation) ([place]) 11/21/2019   Depression 11/21/2019   Dementia due to Parkinson's disease without behavioral disturbance ([place]) 11/21/2019    Past Surgical History:  Procedure Laterality Date   ANKLE SURGERY Right    APPENDECTOMY     CATARACT EXTRACTION Bilateral    PROSTATECTOMY      Prior to Admission medications   Medication Sig Start Date End Date Taking? Authorizing Provider  amiodarone (PACERONE) 200 MG tablet Take 100 mg by mouth daily at 6 (six) AM. 11/03/19   [provider]  ascorbic acid (VITAMIN C) 500 MG tablet Take 500 mg by mouth 3 (three) times daily.    [provider]  aspirin EC 81 MG tablet Take 81 mg by mouth  daily.    [provider]  atorvastatin (LIPITOR) 80 MG tablet Take 80 mg by mouth every evening. 11/03/19   [provider]  b complex vitamins tablet Take 1 tablet by mouth daily.    [provider]  carbidopa-levodopa (SINEMET IR) 25-100 MG tablet TAKE 1 & 1/2 TABLET BY MOUTH EVERY MORNING TAKE 1 TABLET BY MOUTH THREE TIMES A DAY Patient taking differently: Take 1.5 tablets by mouth every morning.  05/02/19   Tat, Eustace Quail, DO  divalproex (DEPAKOTE) 250 MG DR tablet Take 1 tablet (250 mg total) by mouth 2 (two) times daily. Patient taking differently: Take 250 mg by mouth 3 (three) times daily.  08/08/19   Tat, Eustace Quail, DO  docusate sodium (COLACE) 100 MG capsule Take 100 mg by mouth 2 (two) times daily.    [provider]  donepezil (ARICEPT) 10 MG tablet TAKE 1 TABLET BY MOUTH AT BEDTIME Patient taking differently: Take 10 mg by mouth at bedtime.  05/02/19   Tat, Eustace Quail, DO  famotidine (PEPCID) 20 MG tablet Take 20 mg by mouth 2 (two) times daily.    [provider]  furosemide (LASIX) 20 MG tablet Take 20 mg by mouth daily. 01/26/20   [provider]  furosemide (LASIX) 40 MG tablet Take 1 tablet (40 mg total) by mouth 2 (two) times daily. 11/23/19   Louellen Molder, MD  lisinopril (PRINIVIL,ZESTRIL) 5 MG tablet Take 5 mg by mouth daily.    [provider]  meloxicam (MOBIC) 7.5 MG tablet TAKE 1 TABLET BY MOUTH ONCE DAILY 03/01/18   Tat, Eustace Quail, DO  memantine (NAMENDA) 10 MG tablet TAKE 1 TABLET BY MOUTH TWICE A DAY Patient taking differently: Take 10 mg by mouth 2 (two) times daily.  05/02/19   Tat, Eustace Quail, DO  metoprolol succinate (TOPROL-XL) 25 MG 24 hr tablet Take 25 mg by mouth daily. 11/03/17   [provider]  mirtazapine (REMERON) 15 MG tablet Take 15 mg by mouth at bedtime.    [provider]  multivitamin-lutein (OCUVITE-LUTEIN) CAPS capsule Take 1 capsule by mouth daily.    [provider]   sertraline (ZOLOFT) 25 MG tablet Take 25 mg by mouth daily. 11/03/19   [provider]  sertraline (ZOLOFT) 50 MG tablet Take 50 mg by mouth daily. 01/26/20   [provider]  venlafaxine XR (EFFEXOR-XR) 150 MG 24 hr capsule Take 150 mg by mouth daily with breakfast.    [provider]    Allergies Phenylephrine-guaifenesin, Aspirin, Guaifenesin, Guanfacine, Phenylephrine, and Suprax [cefixime]  Family History  Problem Relation Age of Onset   Colon cancer Father    Tremor Mother    Parkinson's disease Brother    Prostate cancer Brother    Lung cancer Sister    Emphysema Sister    Heart failure Child    Diabetes Child    AAA (abdominal aortic aneurysm) Child     Social History Social History   Tobacco Use   Smoking status: Never Smoker   Smokeless tobacco: Never Used  Scientific laboratory technician Use: Never used  Substance Use Topics   Alcohol use: No    Alcohol/week: 0.0 standard drinks   Drug use: No    Review of Systems -some limitations due to caveat Constitutional: No fever/chills Cardiovascular: Denies chest pain. Respiratory: See HPI Gastrointestinal: No abdominal pain.   Musculoskeletal: Negative for back pain.  Aches in all of his joints which she reports was from being an old man.  No leg swelling. Skin: Negative for rash. Neurological: Negative for new weakness or numbness.    ____________________________________________   PHYSICAL EXAM:  VITAL SIGNS: ED Triage Vitals [05/09/20 2006]  Enc Vitals Group     BP (!) 95/57     Pulse Rate 63     Resp 16     Temp 97.9 F (36.6 C)     Temp Source Oral     SpO2 96 %     Weight 169 lb 12.1 oz (77 kg)     Height 5\' 9"  (1.753 m)     Head Circumference      Peak Flow      Pain Score 0     Pain Loc      Pain Edu?      Excl. in [place]?     Constitutional: Alert and oriented.  Well appearing, patient just slightly dyspneic.  Resting without distress though. Eyes:  Conjunctivae are normal. Head: Atraumatic. Nose: No congestion/rhinnorhea. Mouth/Throat: Mucous membranes are moist. Neck: No stridor.  Cardiovascular: Normal rate, regular rhythm. Grossly normal heart sounds.  Good peripheral circulation. Respiratory: Lung sounds notably diminished over the right.  Left lung clear.  Trachea midline.  Very mild increase in respiratory rate but no accessory muscle use.  No distress.  Laying fairly flat quite comfortable. Gastrointestinal: Soft and nontender. No distention. Musculoskeletal: No lower  extremity tenderness nor edema. Neurologic:  Normal speech and language. No gross focal neurologic deficits are appreciated.  Some parkinsonian-like features. Skin:  Skin is warm, dry and intact. No rash noted. Psychiatric: Mood and affect are normal.  Calm, oriented to self and being at hospital but not to date.  ____________________________________________   LABS (all labs ordered are listed, but only abnormal results are displayed)  Labs Reviewed  CBC WITH DIFFERENTIAL/PLATELET - Abnormal; Notable for the following components:      Result Value   Platelets 141 (*)    All other components within normal limits  COMPREHENSIVE METABOLIC PANEL - Abnormal; Notable for the following components:   BUN 34 (*)    Creatinine, Ser 1.48 (*)    Calcium 8.5 (*)    GFR calc non Af Amer 40 (*)    GFR calc Af Amer 46 (*)    All other components within normal limits  BRAIN NATRIURETIC PEPTIDE - Abnormal; Notable for the following components:   B Natriuretic Peptide 487.7 (*)    All other components within normal limits  TROPONIN I (HIGH SENSITIVITY) - Abnormal; Notable for the following components:   Troponin I (High Sensitivity) 28 (*)    All other components within normal limits  TROPONIN I (HIGH SENSITIVITY) - Abnormal; Notable for the following components:   Troponin I (High Sensitivity) 33 (*)    All other components within normal limits  SARS CORONAVIRUS 2 BY  RT PCR (HOSPITAL ORDER, [facility])  CULTURE, BLOOD (SINGLE)  LACTIC ACID, PLASMA  PROTIME-INR  APTT  PROCALCITONIN  CBC WITH DIFFERENTIAL/PLATELET  CBC WITH DIFFERENTIAL/PLATELET   ____________________________________________  EKG  Reviewed inter by me at 2030 Heart rate 65 QRS 149 QTc 500 Left bundle branch block, occasional PVC.  No evidence of acute ischemia.  Evidence of pacemaker. ____________________________________________  IDPOEUMPN  DG Chest 2 View  Addendum Date: 05/09/2020   ADDENDUM REPORT: 05/09/2020 23:15 ADDENDUM: There is an error in the impression. The impression should read as follows: IMPRESSION : 1. Persistent large RIGHT pleural effusion and underlying right lung consolidation. No change since prior exam. Electronically Signed   By: Randa Ngo M.D.   On: 05/09/2020 23:15   Result Date: 05/09/2020 CLINICAL DATA:  Shortness of breath EXAM: CHEST - 2 VIEW COMPARISON:  05/08/2020 FINDINGS: Frontal and lateral views of the chest demonstrate stable dual lead pacemaker. Cardiac silhouette is unremarkable. There is persistent right lung consolidation and large right pleural effusion. Left chest is clear. No pneumothorax. IMPRESSION: 1. Persistent large left pleural effusion and underlying right lung consolidation. No change since prior exam. Electronically Signed: By: Randa Ngo M.D. On: 05/09/2020 20:28     Imaging discussed with Dr. Owens Shark of radiology, of note he has appended his read to demonstrate that the patient has a large right pleural effusion and underlying consolidation ____________________________________________   PROCEDURES  Procedure(s) performed: None  Procedures  Critical Care performed: No  ____________________________________________   INITIAL IMPRESSION / ASSESSMENT AND PLAN / ED COURSE  Pertinent labs & imaging results that were available during my care of the patient were reviewed by me and considered  in my medical decision making (see chart for details).   Dyspnea.  Mild hypoxia.  Notably yesterday had oxygen saturations in the 50-80 range, but here demonstrates oxygen saturations in the low 90s on room air.  Very mild increased work of breathing.  Has a large right-sided pleural effusion, based on his previous history suggesting  this would likely be transudate of possibly related to his history of congestive heart failure, but other differential certainly include infectious, malignant, etc.  Discussed with interventional radiology Dr. Laurence Ferrari, he advises that they will be able to assess for thoracentesis and management if patient is admitted, otherwise could manage him potentially outpatient.  Due to the patient's oxygen requirement though, he is not a good candidate for discharge and mild increased work of breathing.  Will admit to hospitalist for further care and management.  Mildly elevated troponin appears to be chronic.  No evidence of acute ischemia.  No elevated white count.  No fever.  No obvious infectious symptoms.  Send and await procalcitonin.  Discussed case with hospitalist Dr. Damita Dunnings      ____________________________________________   FINAL CLINICAL IMPRESSION(S) / ED DIAGNOSES  Final diagnoses:  Pleural effusion on right  Shortness of breath        Note:  This document was prepared using Dragon voice recognition software and may include unintentional dictation errors       Delman Kitten, MD 05/10/20 [phone]

## 2020-05-09 NOTE — ED Notes (Signed)
Pt states coming to the ER for shortness of breath. Pt denies pain. Pts room air oxygen saturation was 92%. Pt placed on 2LPM via [place].

## 2020-05-09 NOTE — ED Triage Notes (Signed)
Pt from [facility], pt states he is here "for people like me". Per ems notes pt was complaining of shob per staff at [facility]. Pt on 2lpm [place].

## 2020-05-10 ENCOUNTER — Observation Stay: Payer: Medicare Other

## 2020-05-10 ENCOUNTER — Encounter: Payer: Self-pay | Admitting: Internal Medicine

## 2020-05-10 ENCOUNTER — Other Ambulatory Visit: Payer: Self-pay

## 2020-05-10 ENCOUNTER — Observation Stay (HOSPITAL_COMMUNITY)
Admit: 2020-05-10 | Discharge: 2020-05-10 | Disposition: A | Discharge status: Home / Self Care | Payer: Medicare Other | Attending: Internal Medicine | Admitting: Internal Medicine

## 2020-05-10 DIAGNOSIS — Z8546 Personal history of malignant neoplasm of prostate: Secondary | ICD-10-CM | POA: Diagnosis not present

## 2020-05-10 DIAGNOSIS — I959 Hypotension, unspecified: Secondary | ICD-10-CM | POA: Diagnosis present

## 2020-05-10 DIAGNOSIS — F329 Major depressive disorder, single episode, unspecified: Secondary | ICD-10-CM | POA: Diagnosis present

## 2020-05-10 DIAGNOSIS — J9601 Acute respiratory failure with hypoxia: Secondary | ICD-10-CM | POA: Diagnosis present

## 2020-05-10 DIAGNOSIS — Z79899 Other long term (current) drug therapy: Secondary | ICD-10-CM | POA: Diagnosis not present

## 2020-05-10 DIAGNOSIS — I2510 Atherosclerotic heart disease of native coronary artery without angina pectoris: Secondary | ICD-10-CM | POA: Diagnosis present

## 2020-05-10 DIAGNOSIS — I5023 Acute on chronic systolic (congestive) heart failure: Secondary | ICD-10-CM | POA: Diagnosis not present

## 2020-05-10 DIAGNOSIS — I5031 Acute diastolic (congestive) heart failure: Secondary | ICD-10-CM | POA: Diagnosis not present

## 2020-05-10 DIAGNOSIS — J90 Pleural effusion, not elsewhere classified: Secondary | ICD-10-CM | POA: Diagnosis present

## 2020-05-10 DIAGNOSIS — Z791 Long term (current) use of non-steroidal anti-inflammatories (NSAID): Secondary | ICD-10-CM | POA: Diagnosis not present

## 2020-05-10 DIAGNOSIS — Z20822 Contact with and (suspected) exposure to covid-19: Secondary | ICD-10-CM | POA: Diagnosis present

## 2020-05-10 DIAGNOSIS — I480 Paroxysmal atrial fibrillation: Secondary | ICD-10-CM | POA: Diagnosis present

## 2020-05-10 DIAGNOSIS — N179 Acute kidney failure, unspecified: Secondary | ICD-10-CM | POA: Diagnosis present

## 2020-05-10 DIAGNOSIS — F0280 Dementia in other diseases classified elsewhere without behavioral disturbance: Secondary | ICD-10-CM | POA: Diagnosis present

## 2020-05-10 DIAGNOSIS — I5043 Acute on chronic combined systolic (congestive) and diastolic (congestive) heart failure: Secondary | ICD-10-CM | POA: Diagnosis present

## 2020-05-10 DIAGNOSIS — J918 Pleural effusion in other conditions classified elsewhere: Secondary | ICD-10-CM | POA: Diagnosis present

## 2020-05-10 DIAGNOSIS — G20 Parkinson's disease: Secondary | ICD-10-CM | POA: Diagnosis present

## 2020-05-10 DIAGNOSIS — Z7982 Long term (current) use of aspirin: Secondary | ICD-10-CM | POA: Diagnosis not present

## 2020-05-10 DIAGNOSIS — Z9049 Acquired absence of other specified parts of digestive tract: Secondary | ICD-10-CM | POA: Diagnosis not present

## 2020-05-10 DIAGNOSIS — Z9079 Acquired absence of other genital organ(s): Secondary | ICD-10-CM | POA: Diagnosis not present

## 2020-05-10 DIAGNOSIS — E785 Hyperlipidemia, unspecified: Secondary | ICD-10-CM | POA: Diagnosis present

## 2020-05-10 LAB — ECHOCARDIOGRAM COMPLETE
AR max vel: 0.98 cm2
AV Area VTI: 1.06 cm2
AV Area mean vel: 0.91 cm2
AV Mean grad: 4.3 mmHg
AV Peak grad: 8.3 mmHg
Ao pk vel: 1.44 m/s
Area-P 1/2: 2.92 cm2
Calc EF: 37.7 %
Height: 69 in
S' Lateral: 3.27 cm
Single Plane A2C EF: 33.8 %
Single Plane A4C EF: 42.9 %
Weight: 2546.75 oz

## 2020-05-10 LAB — BODY FLUID CELL COUNT WITH DIFFERENTIAL
Eos, Fluid: 5 %
Lymphs, Fluid: 80 %
Monocyte-Macrophage-Serous Fluid: 15 %
Neutrophil Count, Fluid: 0 %
Total Nucleated Cell Count, Fluid: 308 cu mm

## 2020-05-10 LAB — PROTEIN, PLEURAL OR PERITONEAL FLUID: Total protein, fluid: 4.4 g/dL

## 2020-05-10 LAB — PROCALCITONIN: Procalcitonin: <0.1 ng/mL

## 2020-05-10 LAB — SARS CORONAVIRUS 2 BY RT PCR (HOSPITAL ORDER, PERFORMED IN ~~LOC~~ HOSPITAL LAB): SARS Coronavirus 2: NEGATIVE

## 2020-05-10 LAB — GLUCOSE, PLEURAL OR PERITONEAL FLUID: Glucose, Fluid: 101 mg/dL

## 2020-05-10 LAB — TROPONIN I (HIGH SENSITIVITY): Troponin I (High Sensitivity): 33 ng/L — ABNORMAL HIGH (ref <18)

## 2020-05-10 LAB — BRAIN NATRIURETIC PEPTIDE: B Natriuretic Peptide: 487.7 pg/mL — ABNORMAL HIGH (ref 0.0–100.0)

## 2020-05-10 MED ORDER — ENOXAPARIN SODIUM 40 MG/0.4ML ~~LOC~~ SOLN
40.0000 mg | SUBCUTANEOUS | Status: DC
  Filled 2020-05-10: qty 0.4

## 2020-05-10 MED ORDER — SODIUM CHLORIDE 0.9% FLUSH
3.0000 mL | Freq: Two times a day (BID) | INTRAVENOUS | Status: DC
  Administered 2020-05-10 – 2020-05-11 (×3): 3 mL via INTRAVENOUS

## 2020-05-10 MED ORDER — LISINOPRIL 5 MG PO TABS
5.0000 mg | ORAL_TABLET | Freq: Every day | ORAL | Status: DC
  Filled 2020-05-10: qty 1

## 2020-05-10 MED ORDER — ACETAMINOPHEN 325 MG PO TABS
650.0000 mg | ORAL_TABLET | ORAL | Status: DC | PRN
  Administered 2020-05-10: 650 mg via ORAL
  Filled 2020-05-10: qty 2

## 2020-05-10 MED ORDER — SODIUM CHLORIDE 0.9 % IV SOLN: 250.0000 mL | INTRAVENOUS | Status: DC | PRN

## 2020-05-10 MED ORDER — ONDANSETRON HCL 4 MG/2ML IJ SOLN: 4.0000 mg | Freq: Four times a day (QID) | INTRAMUSCULAR | Status: DC | PRN

## 2020-05-10 MED ORDER — FUROSEMIDE 10 MG/ML IJ SOLN
40.0000 mg | Freq: Two times a day (BID) | INTRAMUSCULAR | Status: DC
  Administered 2020-05-10 (×2): 40 mg via INTRAVENOUS
  Filled 2020-05-10 (×3): qty 4

## 2020-05-10 MED ORDER — SODIUM CHLORIDE 0.9% FLUSH: 3.0000 mL | INTRAVENOUS | Status: DC | PRN

## 2020-05-10 MED ORDER — CARBIDOPA-LEVODOPA 25-100 MG PO TABS
1.5000 | ORAL_TABLET | ORAL | Status: DC
  Administered 2020-05-10 – 2020-05-11 (×2): 1.5 via ORAL
  Filled 2020-05-10 (×3): qty 1.5

## 2020-05-10 MED ORDER — METOPROLOL SUCCINATE ER 50 MG PO TB24
25.0000 mg | ORAL_TABLET | Freq: Every day | ORAL | Status: DC
  Administered 2020-05-10 – 2020-05-11 (×2): 25 mg via ORAL
  Filled 2020-05-10 (×2): qty 1

## 2020-05-10 NOTE — Progress Notes (Signed)
   05/10/20 0919  Assess: MEWS Score  Pulse Rate 65  SpO2 99 %  O2 Device Nasal Cannula  O2 Flow Rate (L/min) 3 L/min  Assess: MEWS Score  MEWS Temp 0  MEWS Systolic 0  MEWS Pulse 0  MEWS RR 2  MEWS LOC 0  MEWS Score 2  MEWS Score Color Yellow  Assess: if the MEWS score is Yellow or Red  Were vital signs taken at a resting state? Yes  Focused Assessment No change from prior assessment  Early Detection of Sepsis Score *See Row Information* Low  MEWS guidelines implemented *See Row Information* No, previously yellow, continue vital signs every 4 hours  Treat  MEWS Interventions Other (Comment) (pt scheduled for U/S Thoracentesis by IR this am)  Pain Scale 0-10  Pain Score 0  Take Vital Signs  Increase Vital Sign Frequency  Yellow: Q 2hr X 2 then Q 4hr X 2, if remains yellow, continue Q 4hrs  Escalate  MEWS: Escalate Yellow: discuss with charge nurse/RN and consider discussing with provider and RRT  Notify: Charge Nurse/RN  Name of Charge Nurse/RN Notified Estill Bamberg  Date Charge Nurse/RN Notified 05/10/20  Time Charge Nurse/RN Notified 0919  Document  Patient Outcome Other (Comment) (will continue to monitor)  pt admitted with left pleural effusion; scheduled for Ultrasound Thoracentesis by IR this shift

## 2020-05-10 NOTE — ED Notes (Signed)
ER provider stated he has an EKG and repeat is not needed

## 2020-05-10 NOTE — Progress Notes (Signed)
   05/10/20 0532  Assess: MEWS Score  Temp 97.9 F (36.6 C)  BP 112/65  Pulse Rate 62  Resp (!) 28  SpO2 94 %  O2 Device Nasal Cannula  O2 Flow Rate (L/min) 2 L/min  Assess: MEWS Score  MEWS Temp 0  MEWS Systolic 0  MEWS Pulse 0  MEWS RR 2  MEWS LOC 0  MEWS Score 2  MEWS Score Color Yellow  Assess: if the MEWS score is Yellow or Red  Were vital signs taken at a resting state? Yes  Focused Assessment Change from prior assessment (see assessment flowsheet)  Early Detection of Sepsis Score *See Row Information* Medium  MEWS guidelines implemented *See Row Information* Yes  Treat  MEWS Interventions Other (Comment) (Increased o2 to 3L secure chat MD )  Pain Scale 0-10  Pain Score 0  Take Vital Signs  Increase Vital Sign Frequency  Yellow: Q 2hr X 2 then Q 4hr X 2, if remains yellow, continue Q 4hrs  Escalate  MEWS: Escalate Yellow: discuss with charge nurse/RN and consider discussing with provider and RRT  Notify: Charge Nurse/RN  Name of Charge Nurse/RN Notified Brandi   Date Charge Nurse/RN Notified 05/10/20  Time Charge Nurse/RN Notified 2831  Notify: Provider  Provider Name/Title Rufina Falco, NP  Date Provider Notified 05/10/20  Time Provider Notified [phone]  Notification Type Page (secure chat)  Notification Reason Change in status  Will implement yellow MEWS protocol

## 2020-05-10 NOTE — Progress Notes (Signed)
This note also relates to the following rows which could not be included: Pulse Rate - Cannot attach notes to unvalidated device data ECG Heart Rate - Cannot attach notes to unvalidated device data Resp - Cannot attach notes to unvalidated device data SpO2 - Cannot attach notes to unvalidated device data    05/10/20 1100  Level of Consciousness  Level of Consciousness Alert  MEWS COLOR  MEWS Score Color Yellow  Oxygen Therapy  O2 Device Nasal Cannula  O2 Flow Rate (L/min) 3 L/min  MEWS Score  MEWS Temp 0  MEWS Systolic 0  MEWS Pulse 0  MEWS RR 2  MEWS LOC 0  MEWS Score 2  pt back from Ultrasound at 1005; resting comfortably

## 2020-05-10 NOTE — Progress Notes (Addendum)
No complaints.  Shortness of breath is little better today.  No acute respiratory distress.  Vital signs are stable.  He underwent right-sided thoracentesis today and 2.2 L of pleural fluid was removed.  Hold Lasix and lisinopril because of acute kidney injury and monitor BMP.  Addendum:  I called his son at 7:23 pm to discuss the plan of care. All his questions were answered.

## 2020-05-10 NOTE — TOC Initial Note (Signed)
Transition of Care [facility]) - Initial/Assessment Note    Patient Details  Name: Eric Hendricks MRN: [id number] Date of Birth: 1924/03/08  Transition of Care [facility]) CM/SW Contact:    Shelbie Hutching, RN Phone Number: 05/10/2020, 3:28 PM  Clinical Narrative:                 Patient admitted to the hospital for pleural effusion.  Patient had a thoracentesis today.  Patient is from [facility] facility.  Patient's son, [name] reports that the patient has been living at [facility] for about a year maybe a little longer, he walks with a walker but has had progressive decline over the last 6 months.  PT has been consulted, DC plan is for patient to return to [facility] at discharge.  Patient's son will transport him back to the facility.     Expected Discharge Plan: Assisted Living Barriers to Discharge: Continued Medical Work up   Patient Goals and CMS Choice Patient states their goals for this hospitalization and ongoing recovery are:: To get back to [facility]      Expected Discharge Plan and Services Expected Discharge Plan: Assisted Living   Discharge Planning Services: CM Consult   Living arrangements for the past 2 months: [place]                                      Prior Living Arrangements/Services Living arrangements for the past 2 months: [place]   Patient language and need for interpreter reviewed:: Yes Do you feel safe going back to the place where you live?: Yes      Need for Family Participation in Patient Care: Yes (Comment) (dementia, CHF) Care giver support system in place?: Yes (comment) Current home services: DME (walker) Criminal Activity/Legal Involvement Pertinent to Current Situation/Hospitalization: No - Comment as needed  Activities of Daily Living   ADL Screening (condition at time of admission) Is the patient deaf or have difficulty hearing?: Yes Does the patient have difficulty seeing, even  when wearing glasses/contacts?: No Does the patient have difficulty concentrating, remembering, or making decisions?: Yes Does the patient have difficulty dressing or bathing?: No Does the patient have difficulty walking or climbing stairs?: Yes  Permission Sought/Granted Permission sought to share information with : Case Manager, Customer service manager, Family Supports Permission granted to share information with : Yes, Verbal Permission Granted  Share Information with NAME: [name]  Permission granted to share info w AGENCY: [place] granted to share info w Relationship: son     Emotional Assessment Appearance:: Appears stated age Attitude/Demeanor/Rapport: Engaged Affect (typically observed): Unable to Assess Orientation: : Oriented to Self, Oriented to Place, Oriented to  Time, Oriented to Situation Alcohol / Substance Use: Not Applicable Psych Involvement: No (comment)  Admission diagnosis:  Pleural effusion, left [J90] Acute on chronic systolic CHF (congestive heart failure) (HCC) [I50.23] Patient Active Problem List   Diagnosis Date Noted  . Hypoxia 05/10/2020  . Acute on chronic systolic CHF (congestive heart failure) ([place]) 05/10/2020  . Pleural effusion, right 05/09/2020  . Acute on chronic systolic heart failure ([place]) 11/21/2019  . AF (paroxysmal atrial fibrillation) ([place]) 11/21/2019  . Depression 11/21/2019  . Dementia due to Parkinson's disease without behavioral disturbance ([place]) 11/21/2019   PCP:  [name], MD Pharmacy:   [facility]  [place] [address] Phone: [phone] Fax: [phone]     Social Determinants of Health (SDOH) Interventions    Readmission Risk Interventions No flowsheet data found.

## 2020-05-10 NOTE — H&P (Signed)
History and Physical    Eric Hendricks YOV:[id number] DOB: [birth year] DOA: 05/09/2020  PCP: [name], MD   Patient coming from: Home  I have personally briefly reviewed patient's old medical records in [place]  Chief Complaint: Shortness of breath  HPI: Eric Hendricks is a 86 y.o. male with medical history significant for Parkinson's disease with dementia, CAD, systolic CHF, depression, who presents to the emergency room for the second time in 2 days with shortness of breath.  At his first visit he left without being seen due to prolonged wait times.  At his first visit his O2 sat was in the low 90s on room air.  On this visit he reports continued shortness of breath.  He denies chest pain, cough fever or chills.  Denies lower extremity edema.  Last hospitalization was in March 2021 with acute respiratory failure secondary to heart failure exacerbation.   ED Course: On arrival O2 sat was around 91 on room air with borderline hypotension 95/57 with otherwise normal vitals.  On his blood work troponin 28>>>33.  BNP pending.  EKG with no acute ST-T wave changes.  Blood work otherwise mostly unremarkable.  Creatinine slightly elevated at 1.48.  Chest x-ray showing large right pleural effusion with underlying consolidation.  Review of Systems: Limited due to dementia and hearing impairment  Past Medical History:  Diagnosis Date  . B12 deficiency   . CHF (congestive heart failure) ([place])   . Dementia ([place])   . Depression   . Hyperlipemia   . Parkinson's disease ([place])   . Prostate cancer [facility])     Past Surgical History:  Procedure Laterality Date  . ANKLE SURGERY Right   . APPENDECTOMY    . CATARACT EXTRACTION Bilateral   . PROSTATECTOMY       reports that he has never smoked. He has never used smokeless tobacco. He reports that he does not drink alcohol and does not use drugs.  Allergies  Allergen Reactions  . Phenylephrine-Guaifenesin Palpitations  . Aspirin Other (See  Comments)  . Guaifenesin Other (See Comments)    Unknown reaction  . Guanfacine   . Phenylephrine   . Suprax [Cefixime]     Family History  Problem Relation Age of Onset  . Colon cancer Father   . Tremor Mother   . Parkinson's disease Brother   . Prostate cancer Brother   . Lung cancer Sister   . Emphysema Sister   . Heart failure Child   . Diabetes Child   . AAA (abdominal aortic aneurysm) Child       Prior to Admission medications   Medication Sig Start Date End Date Taking? Authorizing Provider  amiodarone (PACERONE) 200 MG tablet Take 100 mg by mouth daily at 6 (six) AM. 11/03/19   [provider]  ascorbic acid (VITAMIN C) 500 MG tablet Take 500 mg by mouth 3 (three) times daily.    [provider]  aspirin EC 81 MG tablet Take 81 mg by mouth daily.    [provider]  atorvastatin (LIPITOR) 80 MG tablet Take 80 mg by mouth every evening. 11/03/19   [provider]  b complex vitamins tablet Take 1 tablet by mouth daily.    [provider]  carbidopa-levodopa (SINEMET IR) 25-100 MG tablet TAKE 1 & 1/2 TABLET BY MOUTH EVERY MORNING TAKE 1 TABLET BY MOUTH THREE TIMES A DAY Patient taking differently: Take 1.5 tablets by mouth every morning.  05/02/19   TatEustace Quail, DO  divalproex (DEPAKOTE) 250 MG DR tablet Take 1 tablet (250 mg total) by mouth 2 (two) times daily. Patient taking differently: Take 250 mg by mouth 3 (three) times daily.  08/08/19   Tat, Eustace Quail, DO  docusate sodium (COLACE) 100 MG capsule Take 100 mg by mouth 2 (two) times daily.    [provider]  donepezil (ARICEPT) 10 MG tablet TAKE 1 TABLET BY MOUTH AT BEDTIME Patient taking differently: Take 10 mg by mouth at bedtime.  05/02/19   Tat, Eustace Quail, DO  famotidine (PEPCID) 20 MG tablet Take 20 mg by mouth 2 (two) times daily.    [provider]  furosemide (LASIX) 20 MG tablet Take 20 mg by mouth daily. 01/26/20   [provider]    furosemide (LASIX) 40 MG tablet Take 1 tablet (40 mg total) by mouth 2 (two) times daily. 11/23/19   Dhungel, Nishant, MD  lisinopril (PRINIVIL,ZESTRIL) 5 MG tablet Take 5 mg by mouth daily.    [provider]  meloxicam (MOBIC) 7.5 MG tablet TAKE 1 TABLET BY MOUTH ONCE DAILY 03/01/18   Tat, Eustace Quail, DO  memantine (NAMENDA) 10 MG tablet TAKE 1 TABLET BY MOUTH TWICE A DAY Patient taking differently: Take 10 mg by mouth 2 (two) times daily.  05/02/19   Tat, Eustace Quail, DO  metoprolol succinate (TOPROL-XL) 25 MG 24 hr tablet Take 25 mg by mouth daily. 11/03/17   [provider]  mirtazapine (REMERON) 15 MG tablet Take 15 mg by mouth at bedtime.    [provider]  multivitamin-lutein (OCUVITE-LUTEIN) CAPS capsule Take 1 capsule by mouth daily.    [provider]  sertraline (ZOLOFT) 25 MG tablet Take 25 mg by mouth daily. 11/03/19   [provider]  sertraline (ZOLOFT) 50 MG tablet Take 50 mg by mouth daily. 01/26/20   [provider]  venlafaxine XR (EFFEXOR-XR) 150 MG 24 hr capsule Take 150 mg by mouth daily with breakfast.    [provider]    Physical Exam: Vitals:   05/09/20 2006 05/09/20 2245  BP: (!) 95/57 134/74  Pulse: 63 79  Resp: 16 (!) 29  Temp: 97.9 F (36.6 C)   TempSrc: Oral   SpO2: 96% 98%  Weight: 77 kg   Height: 5\' 9"  (1.753 m)      Vitals:   05/09/20 2006 05/09/20 2245  BP: (!) 95/57 134/74  Pulse: 63 79  Resp: 16 (!) 29  Temp: 97.9 F (36.6 C)   TempSrc: Oral   SpO2: 96% 98%  Weight: 77 kg   Height: 5\' 9"  (1.753 m)       Constitutional: Alert and oriented x 2 . Not in any apparent distress HEENT:      Head: Normocephalic and atraumatic.         Eyes: PERLA, EOMI, Conjunctivae are normal. Sclera is non-icteric.       Mouth/Throat: Mucous membranes are moist.       Neck: Supple with no signs of meningismus. Cardiovascular: Regular rate and rhythm. No murmurs, gallops, or rubs. 2+ symmetrical  distal pulses are present . No JVD. No LE edema Respiratory: Respiratory effort normal .Lungs sounds diminished/absent on right.  No wheezes, crackles, or rhonchi on left.  Gastrointestinal: Soft, non tender, and non distended with positive bowel sounds. No rebound or guarding. Genitourinary: No CVA tenderness. Musculoskeletal: Nontender with normal range of motion in all extremities. No cyanosis, or erythema of extremities. Neurologic: Normal speech and language. Face is  symmetric. Moving all extremities. No gross focal neurologic deficits . Skin: Skin is warm, dry.  No rash or ulcers Psychiatric: Mood and affect are normal Speech and behavior are normal   Labs on Admission: I have personally reviewed following labs and imaging studies  CBC: Recent Labs  Lab 05/08/20 1426 05/09/20 2020  WBC 5.0 5.8  NEUTROABS  --  4.4  HGB 14.0 13.7  HCT 42.3 42.7  MCV 96.8 98.6  PLT 143* 294*   Basic Metabolic Panel: Recent Labs  Lab 05/08/20 1426 05/09/20 2020  NA 139 142  K 4.5 4.4  CL 104 103  CO2 25 28  GLUCOSE 110* 92  BUN 30* 34*  CREATININE 1.31* 1.48*  CALCIUM 8.4* 8.5*   GFR: Estimated Creatinine Clearance: 30.5 mL/min (A) (by C-G formula based on SCr of 1.48 mg/dL (H)). Liver Function Tests: Recent Labs  Lab 05/08/20 1426 05/09/20 2020  AST 19 19  ALT 6 10  ALKPHOS 55 56  BILITOT 0.6 0.6  PROT 6.8 7.2  ALBUMIN 3.7 3.9   No results for input(s): LIPASE, AMYLASE in the last 168 hours. No results for input(s): AMMONIA in the last 168 hours. Coagulation Profile: Recent Labs  Lab 05/09/20 2310  INR 1.0   Cardiac Enzymes: No results for input(s): CKTOTAL, CKMB, CKMBINDEX, TROPONINI in the last 168 hours. BNP (last 3 results) No results for input(s): PROBNP in the last 8760 hours. HbA1C: No results for input(s): HGBA1C in the last 72 hours. CBG: No results for input(s): GLUCAP in the last 168 hours. Lipid Profile: No results for input(s): CHOL, HDL, LDLCALC,  TRIG, CHOLHDL, LDLDIRECT in the last 72 hours. Thyroid Function Tests: No results for input(s): TSH, T4TOTAL, FREET4, T3FREE, THYROIDAB in the last 72 hours. Anemia Panel: No results for input(s): VITAMINB12, FOLATE, FERRITIN, TIBC, IRON, RETICCTPCT in the last 72 hours. Urine analysis: No results found for: COLORURINE, APPEARANCEUR, LABSPEC, [place], GLUCOSEU, HGBUR, BILIRUBINUR, KETONESUR, PROTEINUR, UROBILINOGEN, NITRITE, LEUKOCYTESUR  Radiological Exams on Admission: DG Chest 2 View  Addendum Date: 05/09/2020   ADDENDUM REPORT: 05/09/2020 23:15 ADDENDUM: There is an error in the impression. The impression should read as follows: IMPRESSION : 1. Persistent large RIGHT pleural effusion and underlying right lung consolidation. No change since prior exam. Electronically Signed   By: Randa Ngo M.D.   On: 05/09/2020 23:15   Result Date: 05/09/2020 CLINICAL DATA:  Shortness of breath EXAM: CHEST - 2 VIEW COMPARISON:  05/08/2020 FINDINGS: Frontal and lateral views of the chest demonstrate stable dual lead pacemaker. Cardiac silhouette is unremarkable. There is persistent right lung consolidation and large right pleural effusion. Left chest is clear. No pneumothorax. IMPRESSION: 1. Persistent large left pleural effusion and underlying right lung consolidation. No change since prior exam. Electronically Signed: By: Randa Ngo M.D. On: 05/09/2020 20:28   DG Chest 2 View  Result Date: 05/08/2020 CLINICAL DATA:  Short of breath for 1 week EXAM: CHEST - 2 VIEW COMPARISON:  11/21/2019 FINDINGS: Frontal and lateral views of the chest demonstrate a stable dual lead pacer. Cardiac silhouette is unremarkable. There is central vascular congestion with bilateral patchy airspace disease greatest at the lung bases. There is a large right pleural effusion with compressive atelectasis at the right base. No pneumothorax. IMPRESSION: 1. Large right pleural effusion increased since prior study. 2. Mild pulmonary  edema. Electronically Signed   By: Randa Ngo M.D.   On: 05/08/2020 15:19    EKG: Independently reviewed. Interpretation :   Assessment/Plan Principal Problem:  Pleural effusion, left Active Problems:   Acute on chronic systolic heart failure (HCC)   AF (paroxysmal atrial fibrillation) (HCC)   Dementia due to Parkinson's disease without behavioral disturbance ([place])  86 year old male with history of Parkinson's disease with dementia, CAD, systolic CHF, presenting with shortness of breath and hypoxia with a large right pleural effusion on chest x-ray.   Large right pleural effusion Acute on chronic systolic heart failure -Patient with a last known LVEF of 35 to 40% presents for evaluation of shortness of breath and noted to have large right pleural effusion -Lasix 40 mg IV every 12 -Continue metoprolol and lisinopril -Repeat 2D echocardiogram to assess LVEF -Maintain low-sodium diet and daily weights -Interventional radiology consult for consideration of thoracentesis   History of paroxysmal atrial fibrillation -Continue amiodarone and metoprolol for rate control -Patient not on long-term anticoagulation due to increased risk for falls   History of dementia due to Parkinson's disease -Continue Sinemet -Multiple antidepressants as well as donepezil and Namenda which will be continued during this hospitalization   History of coronary artery disease -Patient has elevated troponin levels and this may be rated to his acute CHF exacerbation -Continue aspirin and statins   DVT prophylaxis: Lovenox  Code Status: full code  Family Communication:  none  Disposition Plan: Back to previous home environment Consults called: none  Status: Observation    Athena Masse MD [facility]     05/10/2020, 12:06 AM

## 2020-05-10 NOTE — Procedures (Signed)
Interventional Radiology Procedure Note  Procedure: [place] RIGHT THORA    Complications: None  Estimated Blood Loss:  0  Findings: 2.2 L AMBER PLEURAL FLD REMOVED LABS SENT    Tamera Punt, MD

## 2020-05-10 NOTE — Progress Notes (Signed)
*  PRELIMINARY RESULTS* Echocardiogram 2D Echocardiogram has been performed.  [name] 05/10/2020, 11:29 AM

## 2020-05-11 DIAGNOSIS — F0280 Dementia in other diseases classified elsewhere without behavioral disturbance: Secondary | ICD-10-CM

## 2020-05-11 DIAGNOSIS — G20 Parkinson's disease: Secondary | ICD-10-CM

## 2020-05-11 LAB — BASIC METABOLIC PANEL
Anion gap: 11 (ref 5–15)
BUN: 32 mg/dL — ABNORMAL HIGH (ref 8–23)
CO2: 27 mmol/L (ref 22–32)
Calcium: 8.4 mg/dL — ABNORMAL LOW (ref 8.9–10.3)
Chloride: 101 mmol/L (ref 98–111)
Creatinine, Ser: 1.27 mg/dL — ABNORMAL HIGH (ref 0.61–1.24)
GFR calc Af Amer: 56 mL/min — ABNORMAL LOW (ref >60)
GFR calc non Af Amer: 48 mL/min — ABNORMAL LOW (ref >60)
Glucose, Bld: 104 mg/dL — ABNORMAL HIGH (ref 70–99)
Potassium: 3.7 mmol/L (ref 3.5–5.1)
Sodium: 139 mmol/L (ref 135–145)

## 2020-05-11 LAB — MAGNESIUM: Magnesium: 2.3 mg/dL (ref 1.7–2.4)

## 2020-05-11 MED ORDER — MIRTAZAPINE 15 MG PO TABS: 15.0000 mg | ORAL_TABLET | Freq: Every day | ORAL | Status: DC

## 2020-05-11 MED ORDER — ATORVASTATIN CALCIUM 20 MG PO TABS: 80.0000 mg | ORAL_TABLET | Freq: Every evening | ORAL | Status: DC

## 2020-05-11 MED ORDER — FAMOTIDINE 20 MG PO TABS
20.0000 mg | ORAL_TABLET | Freq: Two times a day (BID) | ORAL | Status: DC
  Administered 2020-05-11: 20 mg via ORAL
  Filled 2020-05-11: qty 1

## 2020-05-11 MED ORDER — DONEPEZIL HCL 5 MG PO TABS: 10.0000 mg | ORAL_TABLET | Freq: Every day | ORAL | Status: DC

## 2020-05-11 MED ORDER — AMIODARONE HCL 200 MG PO TABS
100.0000 mg | ORAL_TABLET | Freq: Every day | ORAL | Status: DC
  Administered 2020-05-11: 100 mg via ORAL
  Filled 2020-05-11: qty 1

## 2020-05-11 MED ORDER — DIVALPROEX SODIUM 250 MG PO DR TAB: 250.0000 mg | DELAYED_RELEASE_TABLET | Freq: Three times a day (TID) | ORAL | Status: AC

## 2020-05-11 MED ORDER — FUROSEMIDE 40 MG PO TABS
40.0000 mg | ORAL_TABLET | Freq: Two times a day (BID) | ORAL | Status: DC
  Administered 2020-05-11: 10:00:00 40 mg via ORAL
  Filled 2020-05-11: qty 1

## 2020-05-11 MED ORDER — SERTRALINE HCL 50 MG PO TABS
50.0000 mg | ORAL_TABLET | Freq: Every day | ORAL | Status: DC
  Administered 2020-05-11: 50 mg via ORAL
  Filled 2020-05-11: qty 1

## 2020-05-11 MED ORDER — CARBIDOPA-LEVODOPA 25-100 MG PO TABS: 1.5000 | ORAL_TABLET | ORAL | Status: DC

## 2020-05-11 MED ORDER — DOCUSATE SODIUM 100 MG PO CAPS
100.0000 mg | ORAL_CAPSULE | Freq: Two times a day (BID) | ORAL | Status: DC
  Administered 2020-05-11: 100 mg via ORAL
  Filled 2020-05-11: qty 1

## 2020-05-11 MED ORDER — DIVALPROEX SODIUM 250 MG PO DR TAB
250.0000 mg | DELAYED_RELEASE_TABLET | Freq: Three times a day (TID) | ORAL | Status: DC
  Administered 2020-05-11: 250 mg via ORAL
  Filled 2020-05-11 (×3): qty 1

## 2020-05-11 MED ORDER — ASPIRIN EC 81 MG PO TBEC
81.0000 mg | DELAYED_RELEASE_TABLET | Freq: Every day | ORAL | Status: DC
  Administered 2020-05-11: 10:00:00 81 mg via ORAL
  Filled 2020-05-11: qty 1

## 2020-05-11 MED ORDER — MEMANTINE HCL 5 MG PO TABS
10.0000 mg | ORAL_TABLET | Freq: Two times a day (BID) | ORAL | Status: DC
  Administered 2020-05-11: 10:00:00 10 mg via ORAL
  Filled 2020-05-11: qty 2

## 2020-05-11 MED ORDER — VENLAFAXINE HCL ER 75 MG PO CP24
150.0000 mg | ORAL_CAPSULE | Freq: Every day | ORAL | Status: DC
  Administered 2020-05-11: 10:00:00 150 mg via ORAL
  Filled 2020-05-11: qty 2

## 2020-05-11 NOTE — Discharge Summary (Addendum)
Physician Discharge Summary  [name]:[id number] DOB: Feb 09, 1924 DOA: 05/09/2020  PCP: [name], MD  Admit date: 05/09/2020 Discharge date: 05/11/2020  Discharge disposition: Assisted living facility with home health care   Recommendations for Outpatient Follow-Up:   Follow-up with PCP in 1 week Follow-up at CHF clinic as scheduled on 05/22/2020  Discharge Diagnosis:   Principal Problem:   Pleural effusion, right Active Problems:   Acute on chronic systolic heart failure (HCC)   AF (paroxysmal atrial fibrillation) (HCC)   Dementia due to Parkinson's disease without behavioral disturbance (HCC)   Acute on chronic respiratory failure with hypoxia (HCC)   Acute on chronic systolic CHF (congestive heart failure) ([place])    Discharge Condition: Stable.  Diet recommendation:  Diet Order            Diet - low sodium heart healthy           Diet Heart Room service appropriate? Yes; Fluid consistency: Thin  Diet effective now                   Code Status: Full Code     Hospital Course:   Mr. Eric Hendricks is a 86 y.o. male with medical history significant for Parkinson's disease with dementia, CAD, chronic systolic CHF, depression, who presented to the emergency room for the second time in 2 days with shortness of breath.  At his first visit he left without being seen due to prolonged wait times.    He came back to the hospital because of increasing shortness of breath.  He was admitted to the hospital for acute hypoxemic respiratory failure, acute on chronic systolic CHF and a large right pleural effusion.  He was treated with IV Lasix.  He underwent right-sided thoracentesis and 2.2 L of pleural fluid was removed.  His symptoms have improved.  He was evaluated by PT who recommended home health PT.  Unfortunately, he could not be weaned off of oxygen.  He will need 1 L/min oxygen at home for chronic hypoxemic respiratory failure.  Oxygen saturation on room air  at rest was 94% and oxygen saturation while ambulating was 88% on room air.  It improved to 94% on 1 L/min oxygen.  He is deemed stable for discharge to assisted living facility today.  Discharge plan was discussed with his son Mr. Gwenyth Ober.    Discharge Exam:    Vitals:   05/11/20 0524 05/11/20 0846 05/11/20 1105 05/11/20 1221  BP: 110/64 (!) 94/58  (!) 105/58  Pulse: 72 65 69 62  Resp: 16 20  20   Temp: 98.5 F (36.9 C) 98.4 F (36.9 C)  98 F (36.7 C)  TempSrc: Oral   Oral  SpO2: 96% 97% 97% 90%  Weight:      Height:         GEN: NAD SKIN: Warm and dry EYES: EOMI ENT: Bilateral hearing impairment CV: RRR PULM: CTA B ABD: soft, ND, NT, +BS CNS: AAO x 3, non focal EXT: No edema or tenderness   The results of significant diagnostics from this hospitalization (including imaging, microbiology, ancillary and laboratory) are listed below for reference.     Procedures and Diagnostic Studies:   DG Chest Port 1 View  Result Date: 05/10/2020 CLINICAL DATA:  CHF, large right pleural effusion, shortness of breath EXAM: PORTABLE CHEST 1 VIEW COMPARISON:  05/09/2020 FINDINGS: Very small residual right pleural effusion following thoracentesis. Some residual bibasilar atelectasis noted. Stable mild cardiomegaly without CHF. Left subclavian  2 lead pacer noted. Negative for pneumothorax. Trachea midline. Aorta atherosclerotic. Degenerative changes of the spine. IMPRESSION: Small residual right effusion following thoracentesis. Negative for pneumothorax. Cardiomegaly and bibasilar atelectasis. Electronically Signed   By: Jerilynn Mages.  Shick M.D.   On: 05/10/2020 09:59   ECHOCARDIOGRAM COMPLETE  Result Date: 05/10/2020    ECHOCARDIOGRAM REPORT   Patient Name:   Eric Hendricks Date of Exam: 05/10/2020 Medical Rec #:  [id number]    Height:       69.0 in Accession #:    [id number]   Weight:       159.2 lb Date of Birth:  12/04/1923    BSA:          1.875 m Patient Age:    86 years     BP:            122/56 mmHg Patient Gender: M            HR:           62 bpm. Exam Location:  [facility] Procedure: 2D Echo, Cardiac Doppler and Color Doppler Indications:     CHF- acute diastolic 527.78  History:         Patient has prior history of Echocardiogram examinations, most                  recent 11/22/2019. CHF; Risk Factors:Dyslipidemia.  Sonographer:     [name] RDCS (AE) Referring Phys:  [id number] Athena Masse Diagnosing Phys: Kathlyn Sacramento MD IMPRESSIONS  1. Left ventricular ejection fraction, by estimation, is 30 to 35%. The left ventricle has moderately decreased function. Left ventricular endocardial border not optimally defined to evaluate regional wall motion. There is moderate left ventricular hypertrophy. Left ventricular diastolic parameters are indeterminate.  2. Right ventricular systolic function is normal. The right ventricular size is normal. Tricuspid regurgitation signal is inadequate for assessing PA pressure.  3. Left atrial size was mildly dilated.  4. Right atrial size was mildly dilated.  5. The mitral valve is normal in structure. Trivial mitral valve regurgitation. No evidence of mitral stenosis.  6. The aortic valve is abnormal. Aortic valve regurgitation is not visualized. Moderate aortic valve stenosis. Aortic valve area, by VTI measures 1.06 cm. Aortic valve mean gradient measures 4.3 mmHg. suspect low gradient moderate aortic stenosis. Marland Kitchen FINDINGS  Left Ventricle: Left ventricular ejection fraction, by estimation, is 30 to 35%. The left ventricle has moderately decreased function. Left ventricular endocardial border not optimally defined to evaluate regional wall motion. The left ventricular internal cavity size was normal in size. There is moderate left ventricular hypertrophy. Left ventricular diastolic parameters are indeterminate. Right Ventricle: The right ventricular size is normal. No increase in right ventricular wall thickness. Right ventricular systolic function is normal. Tricuspid  regurgitation signal is inadequate for assessing PA pressure. The tricuspid regurgitant velocity is 1.94 m/s, and with an assumed right atrial pressure of 10 mmHg, the estimated right ventricular systolic pressure is 14.4 mmHg. Left Atrium: Left atrial size was mildly dilated. Right Atrium: Right atrial size was mildly dilated. Pericardium: There is no evidence of pericardial effusion. Mitral Valve: The mitral valve is normal in structure. Normal mobility of the mitral valve leaflets. Moderate mitral annular calcification. Trivial mitral valve regurgitation. No evidence of mitral valve stenosis. Tricuspid Valve: The tricuspid valve is normal in structure. Tricuspid valve regurgitation is trivial. No evidence of tricuspid stenosis. Aortic Valve: The aortic valve is abnormal. Aortic valve regurgitation is not visualized. Moderate aortic stenosis is  present. Aortic valve mean gradient measures 4.3 mmHg. Aortic valve peak gradient measures 8.3 mmHg. Aortic valve area, by VTI measures 1.06 cm. Pulmonic Valve: The pulmonic valve was not well visualized. Pulmonic valve regurgitation is not visualized. No evidence of pulmonic stenosis. Aorta: The aortic root is normal in size and structure. Venous: The inferior vena cava was not well visualized. IAS/Shunts: No atrial level shunt detected by color flow Doppler. Additional Comments: A pacer wire is visualized.  LEFT VENTRICLE PLAX 2D LVIDd:         3.67 cm     Diastology LVIDs:         3.27 cm     LV e' lateral:   5.87 cm/s LV PW:         1.35 cm     LV E/e' lateral: 11.1 LV IVS:        1.16 cm     LV e' medial:    6.85 cm/s LVOT diam:     2.00 cm     LV E/e' medial:  9.5 LV SV:         24 LV SV Index:   13 LVOT Area:     3.14 cm  LV Volumes (MOD) LV vol d, MOD A2C: 62.4 ml LV vol d, MOD A4C: 60.1 ml LV vol s, MOD A2C: 41.3 ml LV vol s, MOD A4C: 34.3 ml LV SV MOD A2C:     21.1 ml LV SV MOD A4C:     60.1 ml LV SV MOD BP:      24.1 ml RIGHT VENTRICLE RV Basal diam:  3.57 cm  RV S prime:     9.46 cm/s TAPSE (M-mode): 2.8 cm LEFT ATRIUM           Index       RIGHT ATRIUM           Index LA diam:      3.30 cm 1.76 cm/m  RA Area:     20.40 cm LA Vol (A2C): 23.4 ml 12.48 ml/m RA Volume:   70.20 ml  37.44 ml/m LA Vol (A4C): 22.2 ml 11.84 ml/m  AORTIC VALVE                   PULMONIC VALVE AV Area (Vmax):    0.98 cm    PV Vmax:        0.49 m/s AV Area (Vmean):   0.91 cm    PV Peak grad:   1.0 mmHg AV Area (VTI):     1.06 cm    RVOT Peak grad: 1 mmHg AV Vmax:           143.67 cm/s AV Vmean:          94.067 cm/s AV VTI:            0.228 m AV Peak Grad:      8.3 mmHg AV Mean Grad:      4.3 mmHg LVOT Vmax:         45.00 cm/s LVOT Vmean:        27.300 cm/s LVOT VTI:          0.077 m LVOT/AV VTI ratio: 0.34  AORTA Ao Root diam: 3.60 cm MITRAL VALVE               TRICUSPID VALVE MV Area (PHT): 2.92 cm    TR Peak grad:   15.1 mmHg MV Decel Time: 260 msec    TR Vmax:  194.00 cm/s MV E velocity: 65.10 cm/s                            SHUNTS                            Systemic VTI:  0.08 m                            Systemic Diam: 2.00 cm Kathlyn Sacramento MD Electronically signed by Kathlyn Sacramento MD Signature Date/Time: 05/10/2020/5:23:22 PM    Final    US THORACENTESIS ASP PLEURAL SPACE W/IMG GUIDE  Result Date: 05/10/2020 INDICATION: Large right pleural effusion EXAM: ULTRASOUND GUIDED RIGHT THORACENTESIS MEDICATIONS: 1% lidocaine local COMPLICATIONS: None immediate. PROCEDURE: An ultrasound guided thoracentesis was thoroughly discussed with the patient and questions answered. The benefits, risks, alternatives and complications were also discussed. The patient understands and wishes to proceed with the procedure. Written consent was obtained. Ultrasound was performed to localize and mark an adequate pocket of fluid in the right chest. The area was then prepped and draped in the normal sterile fashion. 1% Lidocaine was used for local anesthesia. Under ultrasound guidance a 6 Fr  Safe-T-Centesis catheter was introduced. Thoracentesis was performed. The catheter was removed and a dressing applied. FINDINGS: A total of approximately 2.2 L of amber colored pleural fluid was removed. Samples were sent to the laboratory as requested by the clinical team. IMPRESSION: Successful ultrasound guided right thoracentesis yielding 2.2 L of pleural fluid. Electronically Signed   By: Jerilynn Mages.  Shick M.D.   On: 05/10/2020 09:54     Labs:   Basic Metabolic Panel: Recent Labs  Lab 05/08/20 1426 05/08/20 1426 05/09/20 2020 05/11/20 0559  NA 139  --  142 139  K 4.5   < > 4.4 3.7  CL 104  --  103 101  CO2 25  --  28 27  GLUCOSE 110*  --  92 104*  BUN 30*  --  34* 32*  CREATININE 1.31*  --  1.48* 1.27*  CALCIUM 8.4*  --  8.5* 8.4*  MG  --   --   --  2.3   < > = values in this interval not displayed.   GFR Estimated Creatinine Clearance: 35.6 mL/min (A) (by C-G formula based on SCr of 1.27 mg/dL (H)). Liver Function Tests: Recent Labs  Lab 05/08/20 1426 05/09/20 2020  AST 19 19  ALT 6 10  ALKPHOS 55 56  BILITOT 0.6 0.6  PROT 6.8 7.2  ALBUMIN 3.7 3.9   No results for input(s): LIPASE, AMYLASE in the last 168 hours. No results for input(s): AMMONIA in the last 168 hours. Coagulation profile Recent Labs  Lab 05/09/20 2310  INR 1.0    CBC: Recent Labs  Lab 05/08/20 1426 05/09/20 2020  WBC 5.0 5.8  NEUTROABS  --  4.4  HGB 14.0 13.7  HCT 42.3 42.7  MCV 96.8 98.6  PLT 143* 141*   Cardiac Enzymes: No results for input(s): CKTOTAL, CKMB, CKMBINDEX, TROPONINI in the last 168 hours. BNP: Invalid input(s): POCBNP CBG: No results for input(s): GLUCAP in the last 168 hours. D-Dimer No results for input(s): DDIMER in the last 72 hours. Hgb A1c No results for input(s): HGBA1C in the last 72 hours. Lipid Profile No results for input(s): CHOL, HDL, LDLCALC, TRIG, CHOLHDL, LDLDIRECT in the last 72 hours.  Thyroid function studies No results for input(s): TSH, T4TOTAL,  T3FREE, THYROIDAB in the last 72 hours.  Invalid input(s): FREET3 Anemia work up No results for input(s): VITAMINB12, FOLATE, FERRITIN, TIBC, IRON, RETICCTPCT in the last 72 hours. Microbiology Recent Results (from the past 240 hour(s))  SARS Coronavirus 2 by RT PCR (hospital order, performed in [facility] hospital lab) Nasopharyngeal Nasopharyngeal Swab     Status: None   Collection Time: 05/09/20 11:10 PM   Specimen: Nasopharyngeal Swab  Result Value Ref Range Status   SARS Coronavirus 2 NEGATIVE NEGATIVE Final    Comment: (NOTE) SARS-CoV-2 target nucleic acids are NOT DETECTED.  The SARS-CoV-2 RNA is generally detectable in upper and lower respiratory specimens during the acute phase of infection. The lowest concentration of SARS-CoV-2 viral copies this assay can detect is 250 copies / mL. A negative result does not preclude SARS-CoV-2 infection and should not be used as the sole basis for treatment or other patient management decisions.  A negative result may occur with improper specimen collection / handling, submission of specimen other than nasopharyngeal swab, presence of viral mutation(s) within the areas targeted by this assay, and inadequate number of viral copies (<250 copies / mL). A negative result must be combined with clinical observations, patient history, and epidemiological information.  Fact Sheet for Patients:   StrictlyIdeas.no  Fact Sheet for Healthcare Providers: BankingDealers.co.za  This test is not yet approved or  cleared by the [place] FDA and has been authorized for detection and/or diagnosis of SARS-CoV-2 by FDA under an Emergency Use Authorization (EUA).  This EUA will remain in effect (meaning this test can be used) for the duration of the COVID-19 declaration under Section 564(b)(1) of the Act, 21 U.S.C. section 360bbb-3(b)(1), unless the authorization is terminated or revoked  sooner.  Performed at [facility], [address] [place], [place] [address]   Blood culture (routine single)     Status: None (Preliminary result)   Collection Time: 05/09/20 11:10 PM   Specimen: BLOOD  Result Value Ref Range Status   Specimen Description BLOOD BLOOD RIGHT FOREARM  Final   Special Requests   Final    BOTTLES DRAWN AEROBIC AND ANAEROBIC Blood Culture adequate volume   Culture   Final    NO GROWTH 2 DAYS Performed at [facility], [address] [place], [place] [address]    Report Status PENDING  Incomplete  Body fluid culture     Status: None (Preliminary result)   Collection Time: 05/10/20  9:40 AM   Specimen: PATH Cytology Pleural fluid  Result Value Ref Range Status   Specimen Description   Final    PLEURAL Performed at [facility], [address] [place], [place] [address]    Special Requests   Final    NONE Performed at [facility], [address] [place], [place] [address]    Gram Stain   Final    WBC PRESENT,BOTH PMN AND MONONUCLEAR NO ORGANISMS SEEN CYTOSPIN SMEAR    Culture   Final    NO GROWTH < 12 HOURS Performed at [facility], [place] [address] [place], [place] [address]    Report Status PENDING  Incomplete     Discharge Instructions:   Discharge Instructions    AMB referral to CHF clinic   Complete by: As directed    Diet - low sodium heart healthy   Complete by: As directed    Face-to-face encounter (required for Medicare/Medicaid patients)  Complete by: As directed    I Taquisha Phung certify that this patient is under my care and that I, or a nurse practitioner or physician's assistant working with me, had a face-to-face encounter that meets the physician face-to-face encounter requirements with this patient on 05/11/2020. The encounter with the patient was in whole, or in part for the following medical condition(s) which is the primary reason for home health care (List medical  condition): Debility, CHF   The encounter with the patient was in whole, or in part, for the following medical condition, which is the primary reason for home health care: Debility, CHF   I certify that, based on my findings, the following services are medically necessary home health services: Physical therapy   Reason for Medically Necessary Home Health Services: Therapy- Personnel officer, Public librarian   My clinical findings support the need for the above services:  Shortness of breath with activity Unable to leave home safely without assistance and/or assistive device     Further, I certify that my clinical findings support that this patient is homebound due to:  Shortness of Breath with activity Unable to leave home safely without assistance     Home Health   Complete by: As directed    To provide the following care/treatments: PT   Increase activity slowly   Complete by: As directed      Allergies as of 05/11/2020      Reactions   Phenylephrine-guaifenesin Palpitations   Aspirin Other (See Comments)   Guaifenesin Other (See Comments)   Unknown reaction   Guanfacine    Phenylephrine    Suprax [cefixime]       Medication List    TAKE these medications   amiodarone 200 MG tablet Commonly known as: PACERONE Take 100 mg by mouth daily at 6 (six) AM.   ascorbic acid 500 MG tablet Commonly known as: VITAMIN C Take 500 mg by mouth 3 (three) times daily.   aspirin EC 81 MG tablet Take 81 mg by mouth daily.   atorvastatin 80 MG tablet Commonly known as: LIPITOR Take 80 mg by mouth every evening.   b complex vitamins tablet Take 1 tablet by mouth daily.   carbidopa-levodopa 25-100 MG tablet Commonly known as: SINEMET IR Take 1.5 tablets by mouth every morning. What changed: See the new instructions.   divalproex 250 MG DR tablet Commonly known as: Depakote Take 1 tablet (250 mg total) by mouth 3 (three) times daily.   docusate sodium 100 MG  capsule Commonly known as: COLACE Take 100 mg by mouth 2 (two) times daily.   donepezil 10 MG tablet Commonly known as: ARICEPT TAKE 1 TABLET BY MOUTH AT BEDTIME   famotidine 20 MG tablet Commonly known as: PEPCID Take 20 mg by mouth 2 (two) times daily.   furosemide 40 MG tablet Commonly known as: LASIX Take 1 tablet (40 mg total) by mouth 2 (two) times daily. What changed: Another medication with the same name was removed. Continue taking this medication, and follow the directions you see here.   lisinopril 5 MG tablet Commonly known as: ZESTRIL Take 5 mg by mouth daily.   meloxicam 7.5 MG tablet Commonly known as: MOBIC TAKE 1 TABLET BY MOUTH ONCE DAILY   memantine 10 MG tablet Commonly known as: NAMENDA TAKE 1 TABLET BY MOUTH TWICE A DAY   metoprolol succinate 25 MG 24 hr tablet Commonly known as: TOPROL-XL Take 25 mg by mouth daily.   mirtazapine  15 MG tablet Commonly known as: REMERON Take 15 mg by mouth at bedtime.   multivitamin-lutein Caps capsule Take 1 capsule by mouth daily.   sertraline 25 MG tablet Commonly known as: ZOLOFT Take 25 mg by mouth daily.   sertraline 50 MG tablet Commonly known as: ZOLOFT Take 50 mg by mouth daily.   venlafaxine XR 150 MG 24 hr capsule Commonly known as: EFFEXOR-XR Take 150 mg by mouth daily with breakfast.            Durable Medical Equipment  (From admission, onward)         Start     Ordered   05/11/20 1318  DME Oxygen  Once       Question Answer Comment  Length of Need Lifetime   Mode or (Route) Nasal cannula   Liters per Minute 3   Frequency Continuous (stationary and portable oxygen unit needed)   Oxygen conserving device Yes   Oxygen delivery system Gas      05/11/20 1317          Follow-up Information    [facility] Follow up on 05/22/2020.   Specialty: Cardiology Why: at 1:00pm. Enter through the [facility] entrance Contact information: [place] [phone]               Time coordinating discharge: 35 minutes  Signed:  Jennye Boroughs  [facility] 05/11/2020, 1:23 PM   Pager on [email or web address]. If 7PM-7AM, please contact night-coverage at [email or web address]

## 2020-05-11 NOTE — Plan of Care (Signed)
°  Problem: Education: Goal: Knowledge of General Education information will improve Description: Including pain rating scale, medication(s)/side effects and non-pharmacologic comfort measures Outcome: Adequate for Discharge   Problem: Health Behavior/Discharge Planning: Goal: Ability to manage health-related needs will improve Outcome: Adequate for Discharge   Problem: Clinical Measurements: Goal: Ability to maintain clinical measurements within normal limits will improve Outcome: Adequate for Discharge Goal: Will remain free from infection Outcome: Adequate for Discharge Goal: Diagnostic test results will improve Outcome: Adequate for Discharge Goal: Respiratory complications will improve Outcome: Adequate for Discharge   Problem: Activity: Goal: Risk for activity intolerance will decrease Outcome: Adequate for Discharge   Problem: Pain Managment: Goal: General experience of comfort will improve Outcome: Adequate for Discharge   Problem: Safety: Goal: Ability to remain free from injury will improve Outcome: Adequate for Discharge   Problem: Skin Integrity: Goal: Risk for impaired skin integrity will decrease Outcome: Adequate for Discharge

## 2020-05-11 NOTE — Progress Notes (Signed)
MD order received in [facility] to discharge pt back to [place] with [facility] oxygen; portable oxygen tank previously delivered to the pt's room 130; see TOC SW note regarding home health PT; AVS printed and placed in discharge envelope; no questions voiced at this time; pt discharged via wheelchair by nursing to the visitor's entrance

## 2020-05-11 NOTE — Evaluation (Signed)
Physical Therapy Evaluation Patient Details Name: Eric Hendricks MRN: [id number] DOB: [birth year] Today's Date: 05/11/2020   History of Present Illness  Eric Hendricks is a 86 y.o. male with medical history significant for Parkinson's disease with dementia, CAD, systolic CHF, depression, who presents to the emergency room for the second time in 2 days with shortness of breath.  On arrival O2 sat was around 91 on room air with borderline hypotension 95/57 with otherwise normal vitals.  Chest Xray showing right pleural effusion with underlying consolidation. Pt now s/p thoracentesis (05/10/20).  Clinical Impression  Pt is a pleasant, mildly confused 86 year old M who was admitted for pleural effusion, now s/p thoracentesis (05/10/20). Pt performs bed mobility with supervision/CGA, transfers with supervision/CGA, and ambulation 15' with CGA with RW (and 3L O2 via [place]). Pt transfers supine>Sit with CGA with HOB elevated, performs STSx2 at bedside with RW with CGA and cuing for safe hand placement/RW management. Pt ambulates in-room with 3L [place], RW and CGA for safety, requiring minimal cuing for safe RW management; O2 sat remaining >92% during gait trial. However, once returned to sitting at bedside and while preparing to finish breakfast, O2 sat desats for approx 2 minutes (to 88%) before again returning to >92%; pt then left seated bedside with tray in front to complete breakfast, with bed alarm set and RN notified of activity tolerance/mobility status. Pt demonstrates deficits with balance, strength and activity tolerance limiting ability to perform functional mobility and requiring continued skilled PT intervention.     Follow Up Recommendations Home health PT    Equipment Recommendations  Rolling walker with 5" wheels    Recommendations for Other Services       Precautions / Restrictions Precautions Precautions: Fall Restrictions Weight Bearing Restrictions: No      Mobility  Bed Mobility Overal  bed mobility: Needs Assistance Bed Mobility: Supine to Sit     Supine to sit: Min guard;HOB elevated        Transfers Overall transfer level: Needs assistance Equipment used: Rolling walker (2 wheeled) Transfers: Sit to/from Stand Sit to Stand: Min guard         General transfer comment: STSx2 from bedside, pushing up from bedside and grabbing RW once standing  Ambulation/Gait Ambulation/Gait assistance: Min guard Gait Distance (Feet): 15 Feet Assistive device: Rolling walker (2 wheeled) Gait Pattern/deviations: Step-through pattern     General Gait Details: Slow, small steps with RW; flexed posture. O2 >92% throughout gait trial with 3L donned. Returned to sitting bedside to finish breakfast, with O2 decreasing to 88% briefly, before returning to >92%.  Stairs            Wheelchair Mobility    Modified Rankin (Stroke Patients Only)       Balance Overall balance assessment: Needs assistance Sitting-balance support: Feet supported;No upper extremity supported Sitting balance-Leahy Scale: Good Sitting balance - Comments: Able to sit steady edge of bed while weight shifting to readjust gown, reach for breakfast items on tray.   Standing balance support: During functional activity;Bilateral upper extremity supported Standing balance-Leahy Scale: Fair Standing balance comment: Able to perform standing marching with bil UE support                             Pertinent Vitals/Pain Pain Assessment: No/denies pain    Home Living Family/patient expects to be discharged to:: Assisted living  Home Equipment: [place] - 2 wheels;Other (comment) (difficulty assessing PLOF and available equipment from pt; per RN, famiy (son) endorses pt's occasional use of RW) Additional Comments: difficulty assessing PLOF and available equipment from pt; per RN, famiy (son) reports pt's occasional use of RW    Prior Function Level of Independence:  Independent with assistive device(s)         Comments: difficulty assessing PLOF and available equipment from pt; per RN, famiy (son) reports pt's occasional use of RW in addition to furniture walking     Hand Dominance        Extremity/Trunk Assessment   Upper Extremity Assessment Upper Extremity Assessment: Generalized weakness    Lower Extremity Assessment Lower Extremity Assessment: Generalized weakness    Cervical / Trunk Assessment Cervical / Trunk Assessment: Normal  Communication   Communication: HOH (pt very HOH and unreliable historian, as confirmed by RN who has obtained history from son)  Cognition Arousal/Alertness: Awake/alert Behavior During Therapy: WFL for tasks assessed/performed Overall Cognitive Status: Difficult to assess                                 General Comments: Difficult to assess due to pt being very hard of hearing; generally A&O to self and situation      General Comments      Exercises     Assessment/Plan    PT Assessment Patient needs continued PT services  PT Problem List Decreased strength;Decreased mobility;Decreased safety awareness;Decreased balance       PT Treatment Interventions DME instruction;Therapeutic exercise;Gait training;Balance training;Neuromuscular re-education;Functional mobility training;Cognitive remediation;Therapeutic activities;Patient/family education    PT Goals (Current goals can be found in the Care Plan section)  Acute Rehab PT Goals Patient Stated Goal: to go back home PT Goal Formulation: With patient Time For Goal Achievement: 05/25/20 Potential to Achieve Goals: Good    Frequency Min 2X/week   Barriers to discharge        Co-evaluation               AM-PAC PT "6 Clicks" Mobility  Outcome Measure Help needed turning from your back to your side while in a flat bed without using bedrails?: A Little Help needed moving from lying on your back to sitting on the side  of a flat bed without using bedrails?: A Little Help needed moving to and from a bed to a chair (including a wheelchair)?: A Little Help needed standing up from a chair using your arms (e.g., wheelchair or bedside chair)?: A Little Help needed to walk in hospital room?: A Little Help needed climbing 3-5 steps with a railing? : A Lot 6 Click Score: 17    End of Session Equipment Utilized During Treatment: Gait belt Activity Tolerance: Patient tolerated treatment well Patient left: in bed;with bed alarm set;with call bell/phone within reach (sitting edge of bed with tray to complete breakfast; RN aware and going to room) Nurse Communication: Mobility status PT Visit Diagnosis: Unsteadiness on feet (R26.81);History of falling (Z91.81);Muscle weakness (generalized) (M62.81)    Time: 2706-2376 PT Time Calculation (min) (ACUTE ONLY): 29 min   Charges:   PT Evaluation $PT Eval Low Complexity: 1 Low $PT Eval Moderate Complexity: 1 Mod          Petra Kuba, PT, DPT 05/11/20, 11:31 AM

## 2020-05-11 NOTE — TOC Transition Note (Signed)
Transition of Care [facility]) - CM/SW Discharge Note   Patient Details  Name: Eric Hendricks MRN: [id number] Date of Birth: 05-May-1924  Transition of Care [facility]) CM/SW Contact:  Trecia Rogers, LCSW Phone Number: 05/11/2020, 3:05 PM   Clinical Narrative:     Patient will be discharged back to [facility]. CSW contacted [facility] and they stated that he can come back. They had a concern about his O2 levels. Patient's doctor and nurse put in order for oxygen DME. CSW received DME from [facility]. Oxygen was delivered to bedside. HH PT was requested. [facility] stated that they do this in house. Patient's son will be transporting patient to [facility] for discharge today.  No other needs   Final next level of care: Assisted Living Barriers to Discharge: No Barriers Identified   Patient Goals and CMS Choice Patient states their goals for this hospitalization and ongoing recovery are:: To get back to [facility]      Discharge Placement                Patient to be transferred to facility by: patient's son Name of family member notified: patient's son Patient and family notified of of transfer: 05/11/20 (by nurse)  Discharge Plan and Services   Discharge Planning Services: CM Consult              DME Agency: Other - Comment [facility]) Date DME Agency Contacted: 05/11/20 Time DME Agency Contacted: 77 Representative spoke with at DME Agency: Brenton Grills            Social Determinants of Health ([place]) Interventions     Readmission Risk Interventions No flowsheet data found.

## 2020-05-11 NOTE — Progress Notes (Signed)
SATURATION QUALIFICATIONS: (This note is used to comply with regulatory documentation for home oxygen)  Patient Saturations on Room Air at Rest = 94%  Patient Saturations on Room Air while Ambulating = 88%  Patient Saturations on 1 Liters of oxygen while Ambulating = 94%  Please briefly explain why patient needs home oxygen:

## 2020-05-11 NOTE — Discharge Instructions (Signed)
Home Health  Physical Therapy Home Health oxygen

## 2020-05-12 LAB — PATHOLOGIST SMEAR REVIEW

## 2020-05-13 NOTE — NC FL2 (Signed)
[place] LEVEL OF CARE SCREENING TOOL     IDENTIFICATION  Patient Name: Eric Hendricks Birthdate: 01-Sep-1924 Sex: male Admission Date (Current Location): 05/09/2020  [facility] and [place] Number:  Engineering geologist and Address:  [facility], [address] [place], [place] [address]      Provider Number: [id number]  Attending Physician Name and Address:  No att. providers found  Relative Name and Phone Number:  Dorance Spink (son) [phone]    Current Level of Care: Hospital Recommended Level of Care: Assisted Living Facility Prior Approval Number:    Date Approved/Denied:   PASRR Number:    Discharge Plan: Other (Comment) (assisted Living facility)    Current Diagnoses: Patient Active Problem List   Diagnosis Date Noted   Acute on chronic respiratory failure with hypoxia ([place]) 05/10/2020   Acute on chronic systolic CHF (congestive heart failure) ([place]) 05/10/2020   Pleural effusion, right 05/09/2020   Acute on chronic systolic heart failure ([place]) 11/21/2019   AF (paroxysmal atrial fibrillation) ([place]) 11/21/2019   Depression 11/21/2019   Dementia due to Parkinson's disease without behavioral disturbance ([place]) 11/21/2019    Orientation RESPIRATION BLADDER Height & Weight     Self, Time, Situation, Place  Normal Continent Weight: 72.2 kg Height:  5\' 9"  (175.3 cm)  BEHAVIORAL SYMPTOMS/MOOD NEUROLOGICAL BOWEL NUTRITION STATUS      Continent Diet (Heart Healthy)  AMBULATORY STATUS COMMUNICATION OF NEEDS Skin   Supervision Verbally Normal                       Personal Care Assistance Level of Assistance  Bathing, Dressing, Feeding Bathing Assistance: Limited assistance Feeding assistance: Limited assistance Dressing Assistance: Limited assistance     Functional Limitations Info             SPECIAL CARE FACTORS FREQUENCY                       Contractures Contractures Info: Not present     Additional Factors Info  Code Status, Allergies Code Status Info: Full Allergies Info: Phenylephrine-guaifenesin, aspirin, guaifenesin, guanfacine, suprax           Current Medications (05/13/2020):  This is the current hospital active medication list No current facility-administered medications for this encounter.   Current Outpatient Medications  Medication Sig Dispense Refill   amiodarone (PACERONE) 200 MG tablet Take 100 mg by mouth daily at 6 (six) AM.     ascorbic acid (VITAMIN C) 500 MG tablet Take 500 mg by mouth 3 (three) times daily.     aspirin EC 81 MG tablet Take 81 mg by mouth daily.     atorvastatin (LIPITOR) 80 MG tablet Take 80 mg by mouth every evening.     b complex vitamins tablet Take 1 tablet by mouth daily.     carbidopa-levodopa (SINEMET IR) 25-100 MG tablet Take 1.5 tablets by mouth every morning.     divalproex (DEPAKOTE) 250 MG DR tablet Take 1 tablet (250 mg total) by mouth 3 (three) times daily.     docusate sodium (COLACE) 100 MG capsule Take 100 mg by mouth 2 (two) times daily.     donepezil (ARICEPT) 10 MG tablet TAKE 1 TABLET BY MOUTH AT BEDTIME (Patient taking differently: Take 10 mg by mouth at bedtime. ) 30 tablet 3   famotidine (PEPCID) 20 MG tablet Take 20 mg by mouth 2 (two) times daily.     furosemide (LASIX)  40 MG tablet Take 1 tablet (40 mg total) by mouth 2 (two) times daily. 60 tablet 0   lisinopril (PRINIVIL,ZESTRIL) 5 MG tablet Take 5 mg by mouth daily.     meloxicam (MOBIC) 7.5 MG tablet TAKE 1 TABLET BY MOUTH ONCE DAILY 30 tablet 5   memantine (NAMENDA) 10 MG tablet TAKE 1 TABLET BY MOUTH TWICE A DAY (Patient taking differently: Take 10 mg by mouth 2 (two) times daily. ) 60 tablet 3   metoprolol succinate (TOPROL-XL) 25 MG 24 hr tablet Take 25 mg by mouth daily.     mirtazapine (REMERON) 15 MG tablet Take 15 mg by mouth at bedtime.     multivitamin-lutein (OCUVITE-LUTEIN) CAPS capsule Take 1 capsule by mouth daily.      sertraline (ZOLOFT) 25 MG tablet Take 25 mg by mouth daily.     sertraline (ZOLOFT) 50 MG tablet Take 50 mg by mouth daily.     venlafaxine XR (EFFEXOR-XR) 150 MG 24 hr capsule Take 150 mg by mouth daily with breakfast.       Discharge Medications: TAKE these medications       amiodarone 200 MG tablet Commonly known as: PACERONE Take 100 mg by mouth daily at 6 (six) AM.   ascorbic acid 500 MG tablet Commonly known as: VITAMIN C Take 500 mg by mouth 3 (three) times daily.   aspirin EC 81 MG tablet Take 81 mg by mouth daily.   atorvastatin 80 MG tablet Commonly known as: LIPITOR Take 80 mg by mouth every evening.   b complex vitamins tablet Take 1 tablet by mouth daily.   carbidopa-levodopa 25-100 MG tablet Commonly known as: SINEMET IR Take 1.5 tablets by mouth every morning. What changed: See the new instructions.   divalproex 250 MG DR tablet Commonly known as: Depakote Take 1 tablet (250 mg total) by mouth 3 (three) times daily.   docusate sodium 100 MG capsule Commonly known as: COLACE Take 100 mg by mouth 2 (two) times daily.   donepezil 10 MG tablet Commonly known as: ARICEPT TAKE 1 TABLET BY MOUTH AT BEDTIME   famotidine 20 MG tablet Commonly known as: PEPCID Take 20 mg by mouth 2 (two) times daily.   furosemide 40 MG tablet Commonly known as: LASIX Take 1 tablet (40 mg total) by mouth 2 (two) times daily. What changed: Another medication with the same name was removed. Continue taking this medication, and follow the directions you see here.   lisinopril 5 MG tablet Commonly known as: ZESTRIL Take 5 mg by mouth daily.   meloxicam 7.5 MG tablet Commonly known as: MOBIC TAKE 1 TABLET BY MOUTH ONCE DAILY   memantine 10 MG tablet Commonly known as: NAMENDA TAKE 1 TABLET BY MOUTH TWICE A DAY   metoprolol succinate 25 MG 24 hr tablet Commonly known as: TOPROL-XL Take 25 mg by mouth daily.   mirtazapine 15 MG tablet Commonly  known as: REMERON Take 15 mg by mouth at bedtime.   multivitamin-lutein Caps capsule Take 1 capsule by mouth daily.   sertraline 25 MG tablet Commonly known as: ZOLOFT Take 25 mg by mouth daily.   sertraline 50 MG tablet Commonly known as: ZOLOFT Take 50 mg by mouth daily.   venlafaxine XR 150 MG 24 hr capsule Commonly known as: EFFEXOR-XR Take 150 mg by mouth daily with breakfast.           Relevant Imaging Results:  Relevant Lab Results:   Additional Information    Marshell Levan  Daniel Nones, RN

## 2020-05-14 LAB — CULTURE, BLOOD (SINGLE)
Culture: NO GROWTH
Special Requests: ADEQUATE

## 2020-05-14 LAB — BODY FLUID CULTURE: Culture: NO GROWTH

## 2020-05-21 ENCOUNTER — Telehealth: Payer: Self-pay | Admitting: Family

## 2020-05-21 NOTE — Telephone Encounter (Signed)
LVM with patient regarding his new patient CHF [facility] appointment that was made after his recent hospital discharge. And to confirm appointment for tomorrow 9/2.   Aunya Lemler, NT

## 2020-05-21 NOTE — Progress Notes (Signed)
Patient ID: Eric Hendricks, male    DOB: 04/12/1924, 86 y.o.   MRN: [id number]  HPI  Eric Hendricks is a 86 y/o male with a history of atrial fibrillation, prostate cancer, hyperlipidemia, depression, parkinson's and chronic heart failure.   Echo report from 05/10/20 reviewed and showed an EF of 30-35%.   Admitted 05/09/20 due to HF exacerbation and large pleural effusion. Initially given IV lasix with transition to oral diuretics. Thoracentesis done with removal of 2.2L. PT evaluation done. Was unable to be weaned off oxygen. Discharged after 2 days.   He presents today for his initial visit with a chief complaint of minimal shortness of breath upon moderate exertion. He has associated difficulty sleeping, pedal edema and diarrhea 2-3 times/ week. 43 of history is obtained from his son, Eric Hendricks, who is patient's HCPOA. Eric Hendricks denies that patient has any dizziness, abdominal swelling, chest pain, cough or fatigue. Eric Hendricks doesn't think patient is getting weighed at the facility.   Past Medical History:  Diagnosis Date  . Arrhythmia    atrial fibrillation  . B12 deficiency   . CHF (congestive heart failure) ([place])   . Dementia ([place])   . Depression   . Hyperlipemia   . Parkinson's disease ([place])   . Prostate cancer [facility])    Past Surgical History:  Procedure Laterality Date  . ANKLE SURGERY Right   . APPENDECTOMY    . CATARACT EXTRACTION Bilateral   . PROSTATECTOMY     Family History  Problem Relation Age of Onset  . Colon cancer Father   . Tremor Mother   . Parkinson's disease Brother   . Prostate cancer Brother   . Lung cancer Sister   . Emphysema Sister   . Heart failure Child   . Diabetes Child   . AAA (abdominal aortic aneurysm) Child    Social History   Tobacco Use  . Smoking status: Never Smoker  . Smokeless tobacco: Never Used  Substance Use Topics  . Alcohol use: No    Alcohol/week: 0.0 standard drinks   Allergies  Allergen Reactions  . Phenylephrine-Guaifenesin  Palpitations  . Aspirin Other (See Comments)  . Guaifenesin Other (See Comments)    Unknown reaction  . Guanfacine   . Phenylephrine   . Suprax [Cefixime]    Prior to Admission medications   Medication Sig Start Date End Date Taking? Authorizing Provider  amiodarone (PACERONE) 200 MG tablet Take 100 mg by mouth daily at 6 (six) AM. 11/03/19  Yes [provider]  ascorbic acid (VITAMIN C) 500 MG tablet Take 500 mg by mouth 3 (three) times daily.   Yes [provider]  aspirin EC 81 MG tablet Take 81 mg by mouth daily.   Yes [provider]  atorvastatin (LIPITOR) 80 MG tablet Take 80 mg by mouth every evening. 11/03/19  Yes [provider]  b complex vitamins tablet Take 1 tablet by mouth daily.   Yes [provider]  carbidopa-levodopa (SINEMET IR) 25-100 MG tablet Take 1.5 tablets by mouth every morning. 05/11/20  Yes Jennye Boroughs, MD  divalproex (DEPAKOTE) 250 MG DR tablet Take 1 tablet (250 mg total) by mouth 3 (three) times daily. 05/11/20  Yes Jennye Boroughs, MD  docusate sodium (COLACE) 100 MG capsule Take 100 mg by mouth 2 (two) times daily.   Yes [provider]  donepezil (ARICEPT) 10 MG tablet TAKE 1 TABLET BY MOUTH AT BEDTIME Patient taking differently: Take 10 mg by mouth at bedtime.  05/02/19  Yes Tat, Eustace Quail, DO  famotidine (PEPCID) 20 MG tablet Take 20 mg by mouth 2 (two) times daily.   Yes [provider]  furosemide (LASIX) 40 MG tablet Take 1 tablet (40 mg total) by mouth 2 (two) times daily. 11/23/19  Yes Dhungel, Nishant, MD  lisinopril (PRINIVIL,ZESTRIL) 5 MG tablet Take 5 mg by mouth daily.   Yes [provider]  meloxicam (MOBIC) 7.5 MG tablet TAKE 1 TABLET BY MOUTH ONCE DAILY 03/01/18  Yes Tat, Wells Guiles S, DO  memantine (NAMENDA) 10 MG tablet TAKE 1 TABLET BY MOUTH TWICE A DAY Patient taking differently: Take 10 mg by mouth 2 (two) times daily.  05/02/19  Yes Tat, Eustace Quail, DO  metoprolol succinate  (TOPROL-XL) 25 MG 24 hr tablet Take 25 mg by mouth daily. 11/03/17  Yes [provider]  mirtazapine (REMERON) 15 MG tablet Take 15 mg by mouth at bedtime.   Yes [provider]  multivitamin-lutein (OCUVITE-LUTEIN) CAPS capsule Take 1 capsule by mouth daily.   Yes [provider]  sertraline (ZOLOFT) 25 MG tablet Take 25 mg by mouth daily. 11/03/19  Yes [provider]  sertraline (ZOLOFT) 50 MG tablet Take 50 mg by mouth daily. 01/26/20  Yes [provider]  venlafaxine XR (EFFEXOR-XR) 150 MG 24 hr capsule Take 150 mg by mouth daily with breakfast. Patient not taking: Reported on 05/22/2020    [provider]   Review of Systems  Constitutional: Negative for appetite change and fatigue.  HENT: Positive for hearing loss. Negative for congestion, postnasal drip and sore throat.   Eyes: Negative.   Respiratory: Positive for shortness of breath. Negative for cough.   Cardiovascular: Positive for leg swelling. Negative for chest pain and palpitations.  Gastrointestinal: Positive for diarrhea. Negative for abdominal distention and abdominal pain.  Endocrine: Negative.   Genitourinary: Negative.   Musculoskeletal: Negative.   Skin: Negative.   Allergic/Immunologic: Negative.   Neurological: Negative for dizziness and light-headedness.  Hematological: Negative for adenopathy. Does not bruise/bleed easily.  Psychiatric/Behavioral: Positive for sleep disturbance. Negative for dysphoric mood. The patient is not nervous/anxious.    Vitals:   05/22/20 1301  BP: 95/69  Pulse: (!) 46  Resp: 18  SpO2: 97%  Weight: 152 lb 8 oz (69.2 kg)  Height: 5\' 10"  (1.778 m)   Wt Readings from Last 3 Encounters:  05/22/20 152 lb 8 oz (69.2 kg)  05/10/20 159 lb 2.8 oz (72.2 kg)  05/08/20 170 lb (77.1 kg)   Lab Results  Component Value Date   CREATININE 1.27 (H) 05/11/2020   CREATININE 1.48 (H) 05/09/2020   CREATININE 1.31 (H) 05/08/2020    Physical  Exam Vitals and nursing note reviewed.  Constitutional:      Appearance: Normal appearance.  HENT:     Head: Normocephalic and atraumatic.     Right Ear: Decreased hearing noted.     Left Ear: Decreased hearing noted.  Cardiovascular:     Rate and Rhythm: Regular rhythm. Bradycardia present.  Pulmonary:     Effort: Pulmonary effort is normal. No respiratory distress.     Breath sounds: No wheezing or rales.  Abdominal:     General: There is no distension.     Palpations: Abdomen is soft.  Musculoskeletal:        General: No tenderness.     Cervical back: Neck supple.     Right lower leg: Edema (trace pitting lower leg) present.     Left lower leg: No  edema.  Skin:    General: Skin is warm and dry.  Neurological:     Mental Status: He is alert. Mental status is at baseline.  Psychiatric:        Mood and Affect: Mood normal.        Behavior: Behavior normal.    Assessment & Plan:  1: Chronic heart failure with reduced ejection fraction- - NYHA class II - euvolemic today - to weigh him daily and call for an overnight weight gain of > 2 pounds or a weekly weight gain of >5 pounds - son feels like he's eating well but he's concerned that patient may be getting dehydrated as he's having multiple bouts of diarrhea during the week - will check a BMP today as he's currently taking furosemide 40mg  BID - BNP 05/09/20 was 487.7 - has received both covid vaccines  2: Paroxysmal atrial fibrillation- - saw cardiology ([place]) 12/05/19 - BMP 05/11/20 reviewed and showed sodium 139, potassium 3.7, creatinine 1.27 and GFR 48  3: Dementia due to parkinson's- - saw neurology (Tat) 02/25/20 - PCP is [facility] but he has NP appointment scheduled with Dr Ellison Hughs on 07/14/20   Facility medication list reviewed.   Due to difficulty in getting patient to appointments, son opts to not make a return appointment at this time. Advised him that he could call back at anytime to  schedule another appointment or for any questions and his son was agreeable to this plan.

## 2020-05-22 ENCOUNTER — Ambulatory Visit: Payer: Medicare Other | Attending: Family | Admitting: Family

## 2020-05-22 ENCOUNTER — Other Ambulatory Visit: Payer: Self-pay

## 2020-05-22 ENCOUNTER — Encounter: Payer: Self-pay | Admitting: Family

## 2020-05-22 VITALS — BP 95/69 | HR 46 | Resp 18 | Ht 70.0 in | Wt 152.5 lb

## 2020-05-22 DIAGNOSIS — G20 Parkinson's disease: Secondary | ICD-10-CM | POA: Insufficient documentation

## 2020-05-22 DIAGNOSIS — Z8546 Personal history of malignant neoplasm of prostate: Secondary | ICD-10-CM | POA: Diagnosis not present

## 2020-05-22 DIAGNOSIS — E785 Hyperlipidemia, unspecified: Secondary | ICD-10-CM | POA: Diagnosis not present

## 2020-05-22 DIAGNOSIS — F329 Major depressive disorder, single episode, unspecified: Secondary | ICD-10-CM | POA: Diagnosis not present

## 2020-05-22 DIAGNOSIS — Z79899 Other long term (current) drug therapy: Secondary | ICD-10-CM | POA: Diagnosis not present

## 2020-05-22 DIAGNOSIS — Z791 Long term (current) use of non-steroidal anti-inflammatories (NSAID): Secondary | ICD-10-CM | POA: Insufficient documentation

## 2020-05-22 DIAGNOSIS — I5022 Chronic systolic (congestive) heart failure: Secondary | ICD-10-CM

## 2020-05-22 DIAGNOSIS — Z7982 Long term (current) use of aspirin: Secondary | ICD-10-CM | POA: Insufficient documentation

## 2020-05-22 DIAGNOSIS — I480 Paroxysmal atrial fibrillation: Secondary | ICD-10-CM | POA: Diagnosis not present

## 2020-05-22 DIAGNOSIS — F0280 Dementia in other diseases classified elsewhere without behavioral disturbance: Secondary | ICD-10-CM | POA: Insufficient documentation

## 2020-05-22 DIAGNOSIS — I509 Heart failure, unspecified: Secondary | ICD-10-CM | POA: Diagnosis present

## 2020-05-22 LAB — BASIC METABOLIC PANEL
Anion gap: 12 (ref 5–15)
BUN: 35 mg/dL — ABNORMAL HIGH (ref 8–23)
CO2: 30 mmol/L (ref 22–32)
Calcium: 8.9 mg/dL (ref 8.9–10.3)
Chloride: 99 mmol/L (ref 98–111)
Creatinine, Ser: 1.29 mg/dL — ABNORMAL HIGH (ref 0.61–1.24)
GFR calc Af Amer: 55 mL/min — ABNORMAL LOW (ref >60)
GFR calc non Af Amer: 47 mL/min — ABNORMAL LOW (ref >60)
Glucose, Bld: 100 mg/dL — ABNORMAL HIGH (ref 70–99)
Potassium: 4.3 mmol/L (ref 3.5–5.1)
Sodium: 141 mmol/L (ref 135–145)

## 2020-05-22 NOTE — Patient Instructions (Addendum)
  Weigh daily and call for an overnight weight gain of >2 pounds or a weekly weight gain of >5 pounds.    Call us in the future if you'd like to schedule another appointment.

## 2020-08-25 ENCOUNTER — Ambulatory Visit
Admission: EM | Admit: 2020-08-25 | Discharge: 2020-08-25 | Disposition: A | Discharge status: Home / Self Care | Payer: Medicare Other | Attending: Emergency Medicine | Admitting: Emergency Medicine

## 2020-08-25 ENCOUNTER — Other Ambulatory Visit: Payer: Self-pay

## 2020-08-25 ENCOUNTER — Encounter: Payer: Self-pay | Admitting: Emergency Medicine

## 2020-08-25 DIAGNOSIS — B369 Superficial mycosis, unspecified: Secondary | ICD-10-CM | POA: Diagnosis not present

## 2020-08-25 MED ORDER — MICONAZOLE NITRATE 2 % EX CREA: 1.0000 "application " | TOPICAL_CREAM | Freq: Two times a day (BID) | CUTANEOUS | 0 refills | Status: DC

## 2020-08-25 MED ORDER — MICONAZOLE NITRATE 2 % EX CREA: 1.0000 "application " | TOPICAL_CREAM | Freq: Two times a day (BID) | CUTANEOUS | 1 refills | Status: DC

## 2020-08-25 NOTE — Discharge Instructions (Addendum)
Use flushable wipes to help clean yourself you have a bowel movement.  Apply the miconazole ointment to the tender skin twice daily until the pain and redness has resolved and then continue for 1 week.  Return for new or worsening symptoms.

## 2020-08-25 NOTE — ED Triage Notes (Signed)
Patient reports a place on his bottom that states is very sore that he noticed 4-5 days ago.

## 2020-08-25 NOTE — ED Provider Notes (Signed)
[facility]    CSN: [id number] Arrival date & time: 08/25/20  1423      History   Chief Complaint Chief Complaint  Patient presents with  . Wound Check    HPI Eric Hendricks is a 86 y.o. male.   HPI   86 year old male, who is a resident at [facility] assisted living, is here for evaluation of pain in his bottom for the last 4 to 6 days.  Patient was brought in by his son, who does not live close by, for evaluation.  The patient's son is unaware of any changes to appetite, fever, bleeding, or prior history of similar symptoms.  Past Medical History:  Diagnosis Date  . Arrhythmia    atrial fibrillation  . B12 deficiency   . CHF (congestive heart failure) ([place])   . Dementia ([place])   . Depression   . Hyperlipemia   . Parkinson's disease ([place])   . Prostate cancer [facility])     Patient Active Problem List   Diagnosis Date Noted  . Acute on chronic respiratory failure with hypoxia ([place]) 05/10/2020  . Acute on chronic systolic CHF (congestive heart failure) ([place]) 05/10/2020  . Pleural effusion, right 05/09/2020  . Acute on chronic systolic heart failure ([place]) 11/21/2019  . AF (paroxysmal atrial fibrillation) ([place]) 11/21/2019  . Depression 11/21/2019  . Dementia due to Parkinson's disease without behavioral disturbance ([place]) 11/21/2019    Past Surgical History:  Procedure Laterality Date  . ANKLE SURGERY Right   . APPENDECTOMY    . CATARACT EXTRACTION Bilateral   . PROSTATECTOMY         Home Medications    Prior to Admission medications   Medication Sig Start Date End Date Taking? Authorizing Provider  amiodarone (PACERONE) 200 MG tablet Take 100 mg by mouth daily at 6 (six) AM. 11/03/19  Yes [provider]  ascorbic acid (VITAMIN C) 500 MG tablet Take 500 mg by mouth 3 (three) times daily.   Yes [provider]  aspirin EC 81 MG tablet Take 81 mg by mouth daily.   Yes [provider]  atorvastatin (LIPITOR) 80 MG tablet Take  80 mg by mouth every evening. 11/03/19  Yes [provider]  b complex vitamins tablet Take 1 tablet by mouth daily.   Yes [provider]  carbidopa-levodopa (SINEMET IR) 25-100 MG tablet Take 1.5 tablets by mouth every morning. 05/11/20  Yes Jennye Boroughs, MD  divalproex (DEPAKOTE) 250 MG DR tablet Take 1 tablet (250 mg total) by mouth 3 (three) times daily. 05/11/20  Yes Jennye Boroughs, MD  docusate sodium (COLACE) 100 MG capsule Take 100 mg by mouth 2 (two) times daily.   Yes [provider]  donepezil (ARICEPT) 10 MG tablet TAKE 1 TABLET BY MOUTH AT BEDTIME Patient taking differently: Take 10 mg by mouth at bedtime.  05/02/19  Yes Tat, Eustace Quail, DO  famotidine (PEPCID) 20 MG tablet Take 20 mg by mouth 2 (two) times daily.   Yes [provider]  furosemide (LASIX) 40 MG tablet Take 1 tablet (40 mg total) by mouth 2 (two) times daily. 11/23/19  Yes Dhungel, Nishant, MD  lisinopril (PRINIVIL,ZESTRIL) 5 MG tablet Take 5 mg by mouth daily.   Yes [provider]  meloxicam (MOBIC) 7.5 MG tablet TAKE 1 TABLET BY MOUTH ONCE DAILY 03/01/18  Yes Tat, Wells Guiles S, DO  memantine (NAMENDA) 10 MG tablet TAKE 1 TABLET BY MOUTH TWICE A DAY Patient taking differently: Take 10 mg  by mouth 2 (two) times daily.  05/02/19  Yes Tat, Eustace Quail, DO  metoprolol succinate (TOPROL-XL) 25 MG 24 hr tablet Take 25 mg by mouth daily. 11/03/17  Yes [provider]  mirtazapine (REMERON) 15 MG tablet Take 15 mg by mouth at bedtime.   Yes [provider]  multivitamin-lutein (OCUVITE-LUTEIN) CAPS capsule Take 1 capsule by mouth daily.   Yes [provider]  sertraline (ZOLOFT) 25 MG tablet Take 25 mg by mouth daily. 11/03/19  Yes [provider]  sertraline (ZOLOFT) 50 MG tablet Take 50 mg by mouth daily. 01/26/20  Yes [provider]  venlafaxine XR (EFFEXOR-XR) 150 MG 24 hr capsule Take 150 mg by mouth daily with breakfast.    Yes [provider]  miconazole (MICOTIN) 2 % cream Apply 1 application topically 2 (two) times daily. 08/25/20   Margarette Canada, NP    Family History Family History  Problem Relation Age of Onset  . Colon cancer Father   . Tremor Mother   . Parkinson's disease Brother   . Prostate cancer Brother   . Lung cancer Sister   . Emphysema Sister   . Heart failure Child   . Diabetes Child   . AAA (abdominal aortic aneurysm) Child     Social History Social History   Tobacco Use  . Smoking status: Never Smoker  . Smokeless tobacco: Never Used  Vaping Use  . Vaping Use: Never used  Substance Use Topics  . Alcohol use: No    Alcohol/week: 0.0 standard drinks  . Drug use: No     Allergies   Phenylephrine-guaifenesin, Aspirin, Guaifenesin, Guanfacine, Phenylephrine, and Suprax [cefixime]   Review of Systems Review of Systems  Constitutional: Negative for activity change, appetite change and fever.  HENT: Positive for hearing loss.   Respiratory: Negative for cough and shortness of breath.   Cardiovascular: Negative for chest pain.  Skin: Positive for color change and rash.  Hematological: Negative.   Psychiatric/Behavioral: Negative.      Physical Exam Triage Vital Signs ED Triage Vitals  Enc Vitals Group     BP 08/25/20 1454 121/71     Pulse Rate 08/25/20 1454 67     Resp 08/25/20 1454 (!) 28     Temp 08/25/20 1454 97.6 F (36.4 C)     Temp Source 08/25/20 1454 Oral     SpO2 08/25/20 1454 (!) 88 %     Weight --      Height 08/25/20 1459 5\' 10"  (1.778 m)     Head Circumference --      Peak Flow --      Pain Score 08/25/20 1459 5     Pain Loc --      Pain Edu? --      Excl. in [place]? --    No data found.  Updated Vital Signs BP 121/71 (BP Location: Right Arm)   Pulse 67   Temp 97.6 F (36.4 C) (Oral)   Resp (!) 28   Ht 5\' 10"  (1.778 m)   SpO2 (!) 88% Comment: patient has oxgen but it was turned off. placed on 2L  BMI 21.88 kg/m   Visual Acuity Right Eye  Distance:   Left Eye Distance:   Bilateral Distance:    Right Eye Near:   Left Eye Near:    Bilateral Near:     Physical Exam Vitals and nursing note reviewed.  Constitutional:      General: He is not in acute  distress.    Appearance: Normal appearance. He is normal weight.  HENT:     Head: Normocephalic and atraumatic.  Cardiovascular:     Rate and Rhythm: Normal rate and regular rhythm.     Pulses: Normal pulses.     Heart sounds: Normal heart sounds. No murmur heard.   Pulmonary:     Effort: Pulmonary effort is normal.     Breath sounds: Normal breath sounds. No wheezing, rhonchi or rales.  Musculoskeletal:        General: No swelling or tenderness. Normal range of motion.  Skin:    General: Skin is warm.     Capillary Refill: Capillary refill takes less than 2 seconds.     Findings: Erythema and rash present.     Comments: Patient has a red, scaly, painful rash around the anal opening.  There is also fragments of tissue and dried stool.  Neurological:     General: No focal deficit present.     Mental Status: He is alert and oriented to person, place, and time. Mental status is at baseline.  Psychiatric:        Mood and Affect: Mood normal.        Behavior: Behavior normal.        Thought Content: Thought content normal.        Judgment: Judgment normal.      UC Treatments / Results  Labs (all labs ordered are listed, but only abnormal results are displayed) Labs Reviewed - No data to display  EKG   Radiology No results found.  Procedures Procedures (including critical care time)  Medications Ordered in UC Medications - No data to display  Initial Impression / Assessment and Plan / UC Course  I have reviewed the triage vital signs and the nursing notes.  Pertinent labs & imaging results that were available during my care of the patient were reviewed by me and considered in my medical decision making (see chart for details).   Patient is here for  evaluation of a sore bottom that is been going on for somewhere between 4 and 6 days.  Patient is a resident of an assisted living and his son brought him for evaluation.  His son does not live close by.  The skin around the anal opening is red and excoriated.  There is a erythematous, flaky rash close to the rectal opening with pink inflamed tissue surrounding it.  There are no open lesions or drainage.  No hemorrhoids visible on exam.  I have suggested to the son that he gets his dad flushable wipes that might improve his bathroom hygiene.  The wipes could also help him remove all the stool so that it would not be held close to the skin and cause excoriation.   Final Clinical Impressions(s) / UC Diagnoses   Final diagnoses:  Fungal skin infection     Discharge Instructions     Use flushable wipes to help clean yourself you have a bowel movement.  Apply the miconazole ointment to the tender skin twice daily until the pain and redness has resolved and then continue for 1 week.  Return for new or worsening symptoms.    ED Prescriptions    Medication Sig Dispense Auth. Provider   miconazole (MICATIN) 2 % cream  (Status: Discontinued) Apply 1 application topically 2 (two) times daily. 28.35 g Margarette Canada, NP   miconazole (MICOTIN) 2 % cream Apply 1 application topically 2 (two) times daily. 28.35 g Margarette Canada,  NP     PDMP not reviewed this encounter.   Margarette Canada, NP 08/25/20 [phone]

## 2020-08-26 NOTE — Progress Notes (Signed)
Assessment/Plan:   1. Parkinsonism, could be idiopathic but wonder if not vascular             -once again, change to carbidopa/levodopa 25/100, 1 tablet 3 times per day (facility keeps changing meds and currently only on once per day)  2. Dementia             -On donepezil and memantine.             -On Depakote,  I put him on 250 mg bid but nurses making house calls changed him to tid.  he now has a new PCP             -As have discussed with multiple prior visits, patient needs 24 hour/day care.  He is in assisted living with med management.  3. B12 deficiency             -Appears facility again took him off of the B12 supplement.  Subjective:   Eric Hendricks was seen today in follow up for parkinsonism.  Patient with son who supplements history.  I restarted him on levodopa last visit at lower dose than he had previously been on, but asked that I be the only one that was altering his Parkinson's medications.  He had been having a lot of falls, which was the reason for attempting to restart his medication.  He reports today that he isn't having falls but facility only giving levodopa once per day.  Since that last visit, the patient has switched to a new primary care physician.  He did this because he was frustrated with doctors making house calls (nurse practitioners) changing medications without making him aware.  He wonders if the levodopa got changed before switching him to the new primary care physician (it looks like on his new primary care physician notes that the patient was on levodopa, 1.5 tablets in the morning and 1 tablet in the afternoon and 1 tablet in the evening then).  On a good note, the patient's mood has been stabilized.  He has not really been agitated.  I have reviewed the records from November 2.  Son states that pt in hospital for SOB/CHF and on O2 now.  Pt asks about SOB.  Current prescribed movement disorder medications: Carbidopa/levodopa 25/100, 1 tablet 3  times per day. (son states facility only giving 1.5 tablet in the AM and facility list today confirms that as well) Donepezil Memantine Depakote, 250 mg 3 times daily      ALLERGIES:   Allergies  Allergen Reactions  . Phenylephrine-Guaifenesin Palpitations  . Aspirin Other (See Comments)  . Guaifenesin Other (See Comments)    Unknown reaction  . Guanfacine   . Phenylephrine   . Suprax [Cefixime]     CURRENT MEDICATIONS:  Outpatient Encounter Medications as of 08/29/2020  Medication Sig  . acetaminophen (TYLENOL) 325 MG tablet Take 650 mg by mouth in the morning and at bedtime.  Marland Kitchen amiodarone (PACERONE) 200 MG tablet Take 100 mg by mouth daily at 6 (six) AM.  . ascorbic acid (VITAMIN C) 500 MG tablet Take 500 mg by mouth 3 (three) times daily.  Marland Kitchen aspirin EC 81 MG tablet Take 81 mg by mouth daily.  Marland Kitchen atorvastatin (LIPITOR) 80 MG tablet Take 80 mg by mouth every evening.  . carbidopa-levodopa (SINEMET IR) 25-100 MG tablet Take 1.5 tablets by mouth every morning.  . divalproex (DEPAKOTE) 250 MG DR tablet Take 1 tablet (250 mg total) by mouth 3 (  three) times daily.  Marland Kitchen docusate sodium (COLACE) 100 MG capsule Take 100 mg by mouth 2 (two) times daily.  Marland Kitchen donepezil (ARICEPT) 10 MG tablet TAKE 1 TABLET BY MOUTH AT BEDTIME (Patient taking differently: Take 10 mg by mouth at bedtime.)  . famotidine (PEPCID) 20 MG tablet Take 20 mg by mouth 2 (two) times daily.  . furosemide (LASIX) 40 MG tablet Take 1 tablet (40 mg total) by mouth 2 (two) times daily.  . memantine (NAMENDA) 10 MG tablet TAKE 1 TABLET BY MOUTH TWICE A DAY (Patient taking differently: Take 10 mg by mouth 2 (two) times daily.)  . metoprolol succinate (TOPROL-XL) 25 MG 24 hr tablet Take 25 mg by mouth daily.  . mirtazapine (REMERON) 15 MG tablet Take 15 mg by mouth at bedtime.  . OXYGEN Inhale 1 L into the lungs continuous.  Marland Kitchen lisinopril (PRINIVIL,ZESTRIL) 5 MG tablet Take 5 mg by mouth daily.  . meloxicam (MOBIC) 7.5 MG tablet  TAKE 1 TABLET BY MOUTH ONCE DAILY  . multivitamin-lutein (OCUVITE-LUTEIN) CAPS capsule Take 1 capsule by mouth daily.  . [DISCONTINUED] b complex vitamins tablet Take 1 tablet by mouth daily. (Patient not taking: Reported on 08/29/2020)  . [DISCONTINUED] miconazole (MICOTIN) 2 % cream Apply 1 application topically 2 (two) times daily. (Patient not taking: Reported on 08/29/2020)  . [DISCONTINUED] sertraline (ZOLOFT) 25 MG tablet Take 25 mg by mouth daily. (Patient not taking: No sig reported)  . [DISCONTINUED] sertraline (ZOLOFT) 50 MG tablet Take 50 mg by mouth daily. (Patient not taking: Reported on 08/29/2020)  . [DISCONTINUED] venlafaxine XR (EFFEXOR-XR) 150 MG 24 hr capsule Take 150 mg by mouth daily with breakfast.  (Patient not taking: Reported on 08/29/2020)   No facility-administered encounter medications on file as of 08/29/2020.    Objective:   PHYSICAL EXAMINATION:    VITALS:   Vitals:   08/29/20 1410  BP: (!) 84/52  Pulse: 63  SpO2: 98%  Weight: 157 lb (71.2 kg)  Height: 5\' 10"  (1.778 m)    GEN:  The patient appears stated age and is in NAD. HEENT:  Normocephalic, atraumatic.  The mucous membranes are moist. The superficial temporal arteries are without ropiness or tenderness. CV:  RRR Lungs:  CTAB.  Patient on oxygen.  He has dyspnea on exertion. Neck/HEME:  There are no carotid bruits bilaterally.  Neurological examination:  Orientation: The patient is alert and oriented x3. Cranial nerves: There is good facial symmetry with facial hypomimia. The speech is fluent and clear. Soft palate rises symmetrically and there is no tongue deviation. Hearing is markedly decreased to conversational tone. Sensation: Sensation is intact to light touch throughout Motor: Strength is at least antigravity x4.  Movement examination: Tone: There is normal tone in the upper and lower extremities Abnormal movements: None Coordination:  There is no decremation with RAM's Gait and  Station: The patient pushes off to arise.  He actually does fairly well with a walker today.  He turns en bloc (his son does state that he usually shuffles, and is doing much better today with me).    I have reviewed and interpreted the following labs independently    Chemistry      Component Value Date/Time   NA 141 05/22/2020 1356   K 4.3 05/22/2020 1356   CL 99 05/22/2020 1356   CO2 30 05/22/2020 1356   BUN 35 (H) 05/22/2020 1356   CREATININE 1.29 (H) 05/22/2020 1356      Component Value Date/Time  CALCIUM 8.9 05/22/2020 1356   ALKPHOS 56 05/09/2020 2020   AST 19 05/09/2020 2020   ALT 10 05/09/2020 2020   BILITOT 0.6 05/09/2020 2020       Lab Results  Component Value Date   WBC 5.8 05/09/2020   HGB 13.7 05/09/2020   HCT 42.7 05/09/2020   MCV 98.6 05/09/2020   PLT 141 (L) 05/09/2020    No results found for: TSH   Total time spent on today's visit was 30 minutes, including both face-to-face time and nonface-to-face time.  Time included that spent on review of records (prior notes available to me/labs/imaging if pertinent), discussing treatment and goals, answering patient's questions and coordinating care.  Cc:  [name], MD

## 2020-08-29 ENCOUNTER — Encounter: Payer: Self-pay | Admitting: Neurology

## 2020-08-29 ENCOUNTER — Other Ambulatory Visit: Payer: Self-pay

## 2020-08-29 ENCOUNTER — Ambulatory Visit (INDEPENDENT_AMBULATORY_CARE_PROVIDER_SITE_OTHER): Payer: Medicare Other | Admitting: Neurology

## 2020-08-29 VITALS — BP 84/52 | HR 63 | Ht 70.0 in | Wt 157.0 lb

## 2020-08-29 DIAGNOSIS — G20 Parkinson's disease: Secondary | ICD-10-CM | POA: Diagnosis not present

## 2020-08-29 DIAGNOSIS — F02818 Dementia in other diseases classified elsewhere, unspecified severity, with other behavioral disturbance: Secondary | ICD-10-CM

## 2020-08-29 DIAGNOSIS — F0281 Dementia in other diseases classified elsewhere with behavioral disturbance: Secondary | ICD-10-CM | POA: Diagnosis not present

## 2020-08-29 MED ORDER — CARBIDOPA-LEVODOPA 25-100 MG PO TABS
1.0000 | ORAL_TABLET | Freq: Three times a day (TID) | ORAL | 2 refills | Status: DC
Start: 2020-08-29 — End: 2020-08-29

## 2020-08-29 MED ORDER — CARBIDOPA-LEVODOPA 25-100 MG PO TABS: 1.0000 | ORAL_TABLET | Freq: Three times a day (TID) | ORAL | 2 refills | Status: AC

## 2020-08-29 NOTE — Patient Instructions (Addendum)
1.  Change carbidopa/levodopa 25/100, 1 tablet at 9am/1pm/5pm.  Please give meds 20 min prior to meal or 1 hour after the meal  The physicians and staff at [facility] are committed to providing excellent care. You may receive a survey requesting feedback about your experience at our office. We strive to receive "very good" responses to the survey questions. If you feel that your experience would prevent you from giving the office a "very good " response, please contact our office to try to remedy the situation. We may be reached at [phone]. Thank you for taking the time out of your busy day to complete the survey.

## 2020-09-08 ENCOUNTER — Other Ambulatory Visit: Payer: Self-pay | Admitting: Internal Medicine

## 2020-09-08 DIAGNOSIS — J90 Pleural effusion, not elsewhere classified: Secondary | ICD-10-CM

## 2020-09-09 ENCOUNTER — Other Ambulatory Visit
Admission: RE | Admit: 2020-09-09 | Discharge: 2020-09-09 | Disposition: A | Discharge status: Home / Self Care | Payer: Medicare Other | Source: Ambulatory Visit | Attending: Internal Medicine | Admitting: Internal Medicine

## 2020-09-09 ENCOUNTER — Other Ambulatory Visit: Payer: Self-pay | Admitting: Interventional Radiology

## 2020-09-09 ENCOUNTER — Ambulatory Visit
Admission: RE | Admit: 2020-09-09 | Discharge: 2020-09-09 | Disposition: A | Discharge status: Home / Self Care | Payer: Medicare Other | Source: Ambulatory Visit | Attending: Interventional Radiology | Admitting: Interventional Radiology

## 2020-09-09 ENCOUNTER — Other Ambulatory Visit: Payer: Self-pay

## 2020-09-09 ENCOUNTER — Ambulatory Visit
Admission: RE | Admit: 2020-09-09 | Discharge: 2020-09-09 | Disposition: A | Discharge status: Home / Self Care | Payer: Medicare Other | Source: Ambulatory Visit | Attending: Internal Medicine | Admitting: Internal Medicine

## 2020-09-09 DIAGNOSIS — J90 Pleural effusion, not elsewhere classified: Secondary | ICD-10-CM

## 2020-09-09 DIAGNOSIS — R846 Abnormal cytological findings in specimens from respiratory organs and thorax: Secondary | ICD-10-CM | POA: Insufficient documentation

## 2020-09-09 DIAGNOSIS — Z9889 Other specified postprocedural states: Secondary | ICD-10-CM

## 2020-09-09 DIAGNOSIS — Z20822 Contact with and (suspected) exposure to covid-19: Secondary | ICD-10-CM | POA: Diagnosis not present

## 2020-09-09 LAB — SARS CORONAVIRUS 2 BY RT PCR (HOSPITAL ORDER, PERFORMED IN ~~LOC~~ HOSPITAL LAB): SARS Coronavirus 2: NEGATIVE

## 2020-09-09 LAB — PROTEIN, PLEURAL OR PERITONEAL FLUID: Total protein, fluid: 4.4 g/dL

## 2020-09-09 LAB — AMYLASE, PLEURAL OR PERITONEAL FLUID: Amylase, Fluid: 39 U/L

## 2020-09-09 LAB — GLUCOSE, PLEURAL OR PERITONEAL FLUID: Glucose, Fluid: 98 mg/dL

## 2020-09-09 NOTE — Procedures (Signed)
Interventional Radiology Procedure Note  Procedure: [place] RT THORA    Complications: None  Estimated Blood Loss:  0  Findings: 2.2 L REMOVED    Tamera Punt, MD

## 2020-09-10 LAB — GRAM STAIN

## 2020-09-11 LAB — CYTOLOGY - NON PAP

## 2020-09-23 ENCOUNTER — Other Ambulatory Visit: Payer: Self-pay

## 2020-09-23 DIAGNOSIS — N400 Enlarged prostate without lower urinary tract symptoms: Secondary | ICD-10-CM

## 2020-09-26 ENCOUNTER — Other Ambulatory Visit: Payer: Self-pay

## 2020-09-26 ENCOUNTER — Encounter: Payer: Self-pay | Admitting: Urology

## 2020-09-26 ENCOUNTER — Ambulatory Visit (INDEPENDENT_AMBULATORY_CARE_PROVIDER_SITE_OTHER): Payer: Medicare Other | Admitting: Urology

## 2020-09-26 ENCOUNTER — Other Ambulatory Visit
Admission: RE | Admit: 2020-09-26 | Discharge: 2020-09-26 | Disposition: A | Discharge status: Home / Self Care | Payer: Medicare Other | Attending: Urology | Admitting: Urology

## 2020-09-26 VITALS — BP 102/53 | HR 70 | Ht 67.0 in | Wt 152.0 lb

## 2020-09-26 DIAGNOSIS — N400 Enlarged prostate without lower urinary tract symptoms: Secondary | ICD-10-CM | POA: Insufficient documentation

## 2020-09-26 DIAGNOSIS — Z8546 Personal history of malignant neoplasm of prostate: Secondary | ICD-10-CM | POA: Diagnosis not present

## 2020-09-26 LAB — URINALYSIS, COMPLETE (UACMP) WITH MICROSCOPIC
Bilirubin Urine: NEGATIVE
Glucose, UA: NEGATIVE mg/dL
Hgb urine dipstick: NEGATIVE
Ketones, ur: NEGATIVE mg/dL
Nitrite: NEGATIVE
Protein, ur: NEGATIVE mg/dL
RBC / HPF: NONE SEEN RBC/hpf (ref 0–5)
Specific Gravity, Urine: 1.02 (ref 1.005–1.030)
pH: 6.5 (ref 5.0–8.0)

## 2020-09-26 LAB — BLADDER SCAN AMB NON-IMAGING

## 2020-09-26 NOTE — Progress Notes (Signed)
09/26/2020 2:12 PM   Eric Hendricks 1924-02-01 [id number]  Referring provider: [name], MD [place],  [place] [address]  Chief Complaint  Patient presents with  . Benign Prostatic Hypertrophy    New Patient    HPI: 87 year old male with dementia, Parkinson's disease and multiple other medical issues who presents today to establish care.  He was previously followed at [facility]. His last seen in 2017. He has a personal history of BPH and prostate cancer.  He was on Flomax in the remote past but this was stopped due to concern for falls and has been doing fairly well since.  He has a personal history of prostate cancer. He underwent radiation for this. Notably, in the note from 2016, it was noted that his PSA was rising. You we do an   He also has remote history of a TURP.  He has cystoscopy for May 2017 for gross hematuria which time cystitis as well as changes related to radiation were appreciated.  His last PSA was 1.43 on 06/2015.   PVR 22   IPSS    Row Name 09/26/20 1400         International Prostate Symptom Score   How often have you had the sensation of not emptying your bladder? Not at All     How often have you had to urinate less than every two hours? Less than 1 in 5 times     How often have you found you stopped and started again several times when you urinated? Not at All     How often have you found it difficult to postpone urination? Not at All     How often have you had to strain to start urination? Not at All     How many times did you typically get up at night to urinate? None     Total IPSS Score 1           Quality of Life due to urinary symptoms   If you were to spend the rest of your life with your urinary condition just the way it is now how would you feel about that? Mostly Satisfied            Score:  1-7 Mild 8-19 Moderate 20-35 Severe    PMH: Past Medical History:  Diagnosis Date  . Arrhythmia     atrial fibrillation  . B12 deficiency   . CHF (congestive heart failure) ([place])   . Dementia ([place])   . Depression   . Hyperlipemia   . Parkinson's disease ([place])   . Prostate cancer [facility])     Surgical History: Past Surgical History:  Procedure Laterality Date  . ANKLE SURGERY Right   . APPENDECTOMY    . CATARACT EXTRACTION Bilateral   . PROSTATECTOMY      Home Medications:  Allergies as of 09/26/2020      Reactions   Phenylephrine-guaifenesin Palpitations   Aspirin Other (See Comments)   Guaifenesin Other (See Comments)   Unknown reaction   Guanfacine    Phenylephrine    Suprax [cefixime]       Medication List       Accurate as of September 26, 2020  2:12 PM. If you have any questions, ask your nurse or doctor.        acetaminophen 325 MG tablet Commonly known as: TYLENOL Take 650 mg by mouth in the morning and at bedtime.   amiodarone 200 MG tablet Commonly  known as: PACERONE Take 100 mg by mouth daily at 6 (six) AM.   ascorbic acid 500 MG tablet Commonly known as: VITAMIN C Take 500 mg by mouth 3 (three) times daily.   aspirin EC 81 MG tablet Take 81 mg by mouth daily.   atorvastatin 80 MG tablet Commonly known as: LIPITOR Take 80 mg by mouth every evening.   carbidopa-levodopa 25-100 MG tablet Commonly known as: SINEMET IR Take 1 tablet by mouth 3 (three) times daily. 1 tablet at 9am/1pm/5pm.  Please give meds 20 min prior to meal or 1 hour after the meal   divalproex 250 MG DR tablet Commonly known as: Depakote Take 1 tablet (250 mg total) by mouth 3 (three) times daily.   docusate sodium 100 MG capsule Commonly known as: COLACE Take 100 mg by mouth 2 (two) times daily.   donepezil 10 MG tablet Commonly known as: ARICEPT TAKE 1 TABLET BY MOUTH AT BEDTIME   famotidine 20 MG tablet Commonly known as: PEPCID Take 20 mg by mouth 2 (two) times daily.   furosemide 40 MG tablet Commonly known as: LASIX Take 1 tablet (40 mg total) by mouth 2 (two)  times daily.   lisinopril 5 MG tablet Commonly known as: ZESTRIL Take 5 mg by mouth daily.   meloxicam 7.5 MG tablet Commonly known as: MOBIC TAKE 1 TABLET BY MOUTH ONCE DAILY   memantine 10 MG tablet Commonly known as: NAMENDA TAKE 1 TABLET BY MOUTH TWICE A DAY   metoprolol succinate 25 MG 24 hr tablet Commonly known as: TOPROL-XL Take 25 mg by mouth daily.   mirtazapine 15 MG tablet Commonly known as: REMERON Take 15 mg by mouth at bedtime.   multivitamin-lutein Caps capsule Take 1 capsule by mouth daily.   OXYGEN Inhale 1 L into the lungs continuous.       Allergies:  Allergies  Allergen Reactions  . Phenylephrine-Guaifenesin Palpitations  . Aspirin Other (See Comments)  . Guaifenesin Other (See Comments)    Unknown reaction  . Guanfacine   . Phenylephrine   . Suprax [Cefixime]     Family History: Family History  Problem Relation Age of Onset  . Colon cancer Father   . Tremor Mother   . Parkinson's disease Brother   . Prostate cancer Brother   . Lung cancer Sister   . Emphysema Sister   . Heart failure Child   . Diabetes Child   . AAA (abdominal aortic aneurysm) Child     Social History:  reports that he has never smoked. He has never used smokeless tobacco. He reports that he does not drink alcohol and does not use drugs.   Physical Exam: BP (!) 102/53   Pulse 70   Ht 5\' 7"  (1.702 m)   Wt 152 lb (68.9 kg)   BMI 23.81 kg/m   Constitutional:  Alert and oriented, No acute distress.  Elderly.  Wearing oxygen.  Extremely hard of hearing.  Accompanied by son today. HEENT: [place] AT, moist mucus membranes.  Trachea midline, no masses. Cardiovascular: No clubbing, cyanosis, or edema. Respiratory: Normal respiratory effort, no increased work of breathing. Skin: No rashes, bruises or suspicious lesions. Neurologic: Grossly intact, no focal deficits, moving all 4 extremities. Psychiatric: Normal mood and affect.  Laboratory Data: Lab Results   Component Value Date   WBC 5.8 05/09/2020   HGB 13.7 05/09/2020   HCT 42.7 05/09/2020   MCV 98.6 05/09/2020   PLT 141 (L) 05/09/2020    Lab Results  Component Value Date   CREATININE 1.29 (H) 05/22/2020    Urinalysis    Component Value Date/Time   COLORURINE YELLOW 09/26/2020 1331   APPEARANCEUR CLEAR 09/26/2020 1331   LABSPEC 1.020 09/26/2020 1331   PHURINE 6.5 09/26/2020 1331   GLUCOSEU NEGATIVE 09/26/2020 1331   HGBUR NEGATIVE 09/26/2020 1331   BILIRUBINUR NEGATIVE 09/26/2020 1331   KETONESUR NEGATIVE 09/26/2020 1331   PROTEINUR NEGATIVE 09/26/2020 1331   NITRITE NEGATIVE 09/26/2020 1331   LEUKOCYTESUR SMALL (A) 09/26/2020 1331    Lab Results  Component Value Date   BACTERIA FEW (A) 09/26/2020    Pertinent Imaging: Results for orders placed or performed in visit on 09/26/20  BLADDER SCAN AMB NON-IMAGING  Result Value Ref Range   Scan Result 84ml     Assessment & Plan:    1. Benign prostatic hyperplasia without lower urinary tract symptoms Personal history of BPH, based on current symptoms, excellent PVR, he appears to be doing extremely well on no meds  He has minimal bother  Would not recommend any further intervention or treatment for his BPH at this time.  Would avoid alpha blockers in the future given concern for orthostasis and falls. - BLADDER SCAN AMB NON-IMAGING  2. History of prostate cancer Remote history of prostate cancer with slight uptake in PSA which had stabilized in 2016  We discussed at his age, given that he is essentially asymptomatic, would not want to treat prostate cancer, would reserve hormonal therapy only with recurrence of severe urinary symptoms or other pathology.  As such, we will hold off on even checking a PSA today.  Both he and his son are agreeable with this plan.   Follow-up as needed.   Hollice Espy, MD  [facility] [address], [place], [place] [address] [phone]

## 2020-10-29 ENCOUNTER — Other Ambulatory Visit: Payer: Self-pay | Admitting: Internal Medicine

## 2020-10-30 ENCOUNTER — Other Ambulatory Visit: Payer: Self-pay | Admitting: Internal Medicine

## 2020-10-30 DIAGNOSIS — J90 Pleural effusion, not elsewhere classified: Secondary | ICD-10-CM

## 2020-10-31 ENCOUNTER — Ambulatory Visit: Payer: Medicare Other

## 2020-10-31 ENCOUNTER — Other Ambulatory Visit: Payer: Medicare Other

## 2020-11-04 ENCOUNTER — Other Ambulatory Visit: Payer: Self-pay | Admitting: Internal Medicine

## 2020-11-04 ENCOUNTER — Ambulatory Visit
Admission: RE | Admit: 2020-11-04 | Discharge: 2020-11-04 | Disposition: A | Discharge status: Home / Self Care | Payer: Medicare Other | Source: Ambulatory Visit | Attending: Student | Admitting: Student

## 2020-11-04 ENCOUNTER — Other Ambulatory Visit: Payer: Self-pay | Admitting: Student

## 2020-11-04 ENCOUNTER — Ambulatory Visit
Admission: RE | Admit: 2020-11-04 | Discharge: 2020-11-04 | Disposition: A | Discharge status: Home / Self Care | Payer: Medicare Other | Source: Ambulatory Visit | Attending: Internal Medicine | Admitting: Internal Medicine

## 2020-11-04 ENCOUNTER — Other Ambulatory Visit: Payer: Self-pay

## 2020-11-04 ENCOUNTER — Other Ambulatory Visit: Payer: Self-pay | Admitting: Interventional Radiology

## 2020-11-04 ENCOUNTER — Other Ambulatory Visit
Admission: RE | Admit: 2020-11-04 | Discharge: 2020-11-04 | Disposition: A | Discharge status: Home / Self Care | Payer: Medicare Other | Source: Ambulatory Visit | Attending: Internal Medicine | Admitting: Internal Medicine

## 2020-11-04 ENCOUNTER — Ambulatory Visit
Admission: RE | Admit: 2020-11-04 | Discharge: 2020-11-04 | Disposition: A | Discharge status: Home / Self Care | Payer: Medicare Other | Source: Ambulatory Visit | Attending: Interventional Radiology | Admitting: Interventional Radiology

## 2020-11-04 DIAGNOSIS — Z20822 Contact with and (suspected) exposure to covid-19: Secondary | ICD-10-CM | POA: Diagnosis not present

## 2020-11-04 DIAGNOSIS — I700 Atherosclerosis of aorta: Secondary | ICD-10-CM | POA: Insufficient documentation

## 2020-11-04 DIAGNOSIS — J90 Pleural effusion, not elsewhere classified: Secondary | ICD-10-CM

## 2020-11-04 DIAGNOSIS — J939 Pneumothorax, unspecified: Secondary | ICD-10-CM | POA: Insufficient documentation

## 2020-11-04 DIAGNOSIS — Z9889 Other specified postprocedural states: Secondary | ICD-10-CM

## 2020-11-04 LAB — PROTEIN, PLEURAL OR PERITONEAL FLUID: Total protein, fluid: 3.9 g/dL

## 2020-11-04 LAB — AMYLASE, PLEURAL OR PERITONEAL FLUID: Amylase, Fluid: 41 U/L

## 2020-11-04 LAB — BODY FLUID CELL COUNT WITH DIFFERENTIAL
Eos, Fluid: 1 %
Lymphs, Fluid: 86 %
Monocyte-Macrophage-Serous Fluid: 12 %
Neutrophil Count, Fluid: 1 %
Other Cells, Fluid: 0 %
Total Nucleated Cell Count, Fluid: 727 cu mm

## 2020-11-04 LAB — GLUCOSE, PLEURAL OR PERITONEAL FLUID: Glucose, Fluid: 91 mg/dL

## 2020-11-04 LAB — SARS CORONAVIRUS 2 BY RT PCR (HOSPITAL ORDER, PERFORMED IN ~~LOC~~ HOSPITAL LAB): SARS Coronavirus 2: NEGATIVE

## 2020-11-04 LAB — LACTATE DEHYDROGENASE, PLEURAL OR PERITONEAL FLUID: LD, Fluid: 211 U/L — ABNORMAL HIGH (ref 3–23)

## 2020-11-04 NOTE — Procedures (Signed)
PROCEDURE SUMMARY:  Successful US guided right thoracentesis. Yielded 2.4 L of amber-colored fluid. Pt tolerated procedure well. No immediate complications.  Specimen  sent for labs. CXR ordered; small pneumothorax identified. Repeat CXR in one hour showed the same; patient asymptomatic and ok for discharge home per radiologist  EBL < 2 mL  Theresa Duty, NP 11/04/2020 4:52 PM

## 2020-11-06 LAB — PH, BODY FLUID: pH, Body Fluid: 7.5

## 2020-11-06 LAB — CYTOLOGY - NON PAP

## 2020-11-08 LAB — BODY FLUID CULTURE W GRAM STAIN: Culture: NO GROWTH

## 2020-12-15 ENCOUNTER — Inpatient Hospital Stay
Admission: EM | Admit: 2020-12-15 | Discharge: 2020-12-19 | DRG: 186 | Disposition: A | Discharge status: Nursing Home (Includes ICF) | Payer: Medicare Other | Source: Skilled Nursing Facility | Attending: Internal Medicine | Admitting: Internal Medicine

## 2020-12-15 ENCOUNTER — Observation Stay: Payer: Medicare Other

## 2020-12-15 ENCOUNTER — Emergency Department: Payer: Medicare Other

## 2020-12-15 ENCOUNTER — Other Ambulatory Visit: Payer: Self-pay

## 2020-12-15 ENCOUNTER — Other Ambulatory Visit: Payer: Self-pay | Admitting: Internal Medicine

## 2020-12-15 DIAGNOSIS — I509 Heart failure, unspecified: Secondary | ICD-10-CM

## 2020-12-15 DIAGNOSIS — F0280 Dementia in other diseases classified elsewhere without behavioral disturbance: Secondary | ICD-10-CM | POA: Diagnosis present

## 2020-12-15 DIAGNOSIS — Z820 Family history of epilepsy and other diseases of the nervous system: Secondary | ICD-10-CM

## 2020-12-15 DIAGNOSIS — Z7901 Long term (current) use of anticoagulants: Secondary | ICD-10-CM

## 2020-12-15 DIAGNOSIS — I482 Chronic atrial fibrillation, unspecified: Secondary | ICD-10-CM | POA: Diagnosis present

## 2020-12-15 DIAGNOSIS — J90 Pleural effusion, not elsewhere classified: Secondary | ICD-10-CM

## 2020-12-15 DIAGNOSIS — J9621 Acute and chronic respiratory failure with hypoxia: Secondary | ICD-10-CM | POA: Diagnosis present

## 2020-12-15 DIAGNOSIS — Z79899 Other long term (current) drug therapy: Secondary | ICD-10-CM

## 2020-12-15 DIAGNOSIS — Z881 Allergy status to other antibiotic agents status: Secondary | ICD-10-CM

## 2020-12-15 DIAGNOSIS — J9601 Acute respiratory failure with hypoxia: Secondary | ICD-10-CM

## 2020-12-15 DIAGNOSIS — R0600 Dyspnea, unspecified: Secondary | ICD-10-CM

## 2020-12-15 DIAGNOSIS — I130 Hypertensive heart and chronic kidney disease with heart failure and stage 1 through stage 4 chronic kidney disease, or unspecified chronic kidney disease: Secondary | ICD-10-CM | POA: Diagnosis present

## 2020-12-15 DIAGNOSIS — G4700 Insomnia, unspecified: Secondary | ICD-10-CM | POA: Diagnosis present

## 2020-12-15 DIAGNOSIS — R0602 Shortness of breath: Secondary | ICD-10-CM | POA: Diagnosis not present

## 2020-12-15 DIAGNOSIS — Z20822 Contact with and (suspected) exposure to covid-19: Secondary | ICD-10-CM | POA: Diagnosis present

## 2020-12-15 DIAGNOSIS — G309 Alzheimer's disease, unspecified: Secondary | ICD-10-CM | POA: Diagnosis present

## 2020-12-15 DIAGNOSIS — Z8546 Personal history of malignant neoplasm of prostate: Secondary | ICD-10-CM

## 2020-12-15 DIAGNOSIS — I480 Paroxysmal atrial fibrillation: Secondary | ICD-10-CM | POA: Diagnosis present

## 2020-12-15 DIAGNOSIS — Z66 Do not resuscitate: Secondary | ICD-10-CM | POA: Diagnosis present

## 2020-12-15 DIAGNOSIS — G20 Parkinson's disease: Secondary | ICD-10-CM | POA: Diagnosis present

## 2020-12-15 DIAGNOSIS — Z886 Allergy status to analgesic agent status: Secondary | ICD-10-CM

## 2020-12-15 DIAGNOSIS — Z9889 Other specified postprocedural states: Secondary | ICD-10-CM

## 2020-12-15 DIAGNOSIS — Z888 Allergy status to other drugs, medicaments and biological substances status: Secondary | ICD-10-CM

## 2020-12-15 DIAGNOSIS — I2510 Atherosclerotic heart disease of native coronary artery without angina pectoris: Secondary | ICD-10-CM | POA: Diagnosis present

## 2020-12-15 DIAGNOSIS — R1312 Dysphagia, oropharyngeal phase: Secondary | ICD-10-CM

## 2020-12-15 DIAGNOSIS — I5022 Chronic systolic (congestive) heart failure: Secondary | ICD-10-CM | POA: Diagnosis present

## 2020-12-15 DIAGNOSIS — Z9981 Dependence on supplemental oxygen: Secondary | ICD-10-CM

## 2020-12-15 DIAGNOSIS — Z7982 Long term (current) use of aspirin: Secondary | ICD-10-CM

## 2020-12-15 DIAGNOSIS — Z8249 Family history of ischemic heart disease and other diseases of the circulatory system: Secondary | ICD-10-CM

## 2020-12-15 DIAGNOSIS — E785 Hyperlipidemia, unspecified: Secondary | ICD-10-CM | POA: Diagnosis present

## 2020-12-15 LAB — CBC WITH DIFFERENTIAL/PLATELET
Abs Immature Granulocytes: 0.03 10*3/uL (ref 0.00–0.07)
Basophils Absolute: 0 10*3/uL (ref 0.0–0.1)
Basophils Relative: 0 %
Eosinophils Absolute: 0 10*3/uL (ref 0.0–0.5)
Eosinophils Relative: 0 %
HCT: 38.8 % — ABNORMAL LOW (ref 39.0–52.0)
Hemoglobin: 12.2 g/dL — ABNORMAL LOW (ref 13.0–17.0)
Immature Granulocytes: 1 %
Lymphocytes Relative: 5 %
Lymphs Abs: 0.3 10*3/uL — ABNORMAL LOW (ref 0.7–4.0)
MCH: 30.8 pg (ref 26.0–34.0)
MCHC: 31.4 g/dL (ref 30.0–36.0)
MCV: 98 fL (ref 80.0–100.0)
Monocytes Absolute: 0.7 10*3/uL (ref 0.1–1.0)
Monocytes Relative: 11 %
Neutro Abs: 5.1 10*3/uL (ref 1.7–7.7)
Neutrophils Relative %: 83 %
Platelets: 224 10*3/uL (ref 150–400)
RBC: 3.96 MIL/uL — ABNORMAL LOW (ref 4.22–5.81)
RDW: 17.1 % — ABNORMAL HIGH (ref 11.5–15.5)
WBC: 6.2 10*3/uL (ref 4.0–10.5)
nRBC: 0 % (ref 0.0–0.2)

## 2020-12-15 LAB — BASIC METABOLIC PANEL
Anion gap: 10 (ref 5–15)
BUN: 35 mg/dL — ABNORMAL HIGH (ref 8–23)
CO2: 28 mmol/L (ref 22–32)
Calcium: 8.5 mg/dL — ABNORMAL LOW (ref 8.9–10.3)
Chloride: 101 mmol/L (ref 98–111)
Creatinine, Ser: 1.38 mg/dL — ABNORMAL HIGH (ref 0.61–1.24)
GFR, Estimated: 47 mL/min — ABNORMAL LOW (ref >60)
Glucose, Bld: 114 mg/dL — ABNORMAL HIGH (ref 70–99)
Potassium: 4.4 mmol/L (ref 3.5–5.1)
Sodium: 139 mmol/L (ref 135–145)

## 2020-12-15 LAB — BLOOD GAS, VENOUS
Acid-Base Excess: 8 mmol/L — ABNORMAL HIGH (ref 0.0–2.0)
Bicarbonate: 34.2 mmol/L — ABNORMAL HIGH (ref 20.0–28.0)
O2 Saturation: 35.3 %
Patient temperature: 37
pCO2, Ven: 54 mmHg (ref 44.0–60.0)
pH, Ven: 7.41 (ref 7.250–7.430)
pO2, Ven: <31 mmHg — CL (ref 32.0–45.0)

## 2020-12-15 LAB — TSH: TSH: 9.575 u[IU]/mL — ABNORMAL HIGH (ref 0.350–4.500)

## 2020-12-15 LAB — LACTIC ACID, PLASMA
Lactic Acid, Venous: 1 mmol/L (ref 0.5–1.9)
Lactic Acid, Venous: 1.5 mmol/L (ref 0.5–1.9)

## 2020-12-15 LAB — TROPONIN I (HIGH SENSITIVITY)
Troponin I (High Sensitivity): 67 ng/L — ABNORMAL HIGH (ref <18)
Troponin I (High Sensitivity): 62 ng/L — ABNORMAL HIGH (ref <18)

## 2020-12-15 LAB — MRSA PCR SCREENING: MRSA by PCR: NEGATIVE

## 2020-12-15 LAB — BRAIN NATRIURETIC PEPTIDE: B Natriuretic Peptide: 756.3 pg/mL — ABNORMAL HIGH (ref 0.0–100.0)

## 2020-12-15 LAB — PROCALCITONIN: Procalcitonin: <0.1 ng/mL

## 2020-12-15 LAB — MAGNESIUM: Magnesium: 2.5 mg/dL — ABNORMAL HIGH (ref 1.7–2.4)

## 2020-12-15 LAB — D-DIMER, QUANTITATIVE: D-Dimer, Quant: 2.56 ug/mL-FEU — ABNORMAL HIGH (ref 0.00–0.50)

## 2020-12-15 MED ORDER — FUROSEMIDE 10 MG/ML IJ SOLN
40.0000 mg | Freq: Once | INTRAMUSCULAR | Status: AC
  Administered 2020-12-15: 40 mg via INTRAVENOUS
  Filled 2020-12-15: qty 4

## 2020-12-15 MED ORDER — DIVALPROEX SODIUM 250 MG PO DR TAB
250.0000 mg | DELAYED_RELEASE_TABLET | Freq: Three times a day (TID) | ORAL | Status: DC
  Administered 2020-12-15 – 2020-12-19 (×12): 250 mg via ORAL
  Filled 2020-12-15 (×16): qty 1

## 2020-12-15 MED ORDER — ATORVASTATIN CALCIUM 80 MG PO TABS
80.0000 mg | ORAL_TABLET | Freq: Every evening | ORAL | Status: DC
  Administered 2020-12-15 – 2020-12-18 (×4): 80 mg via ORAL
  Filled 2020-12-15: qty 4
  Filled 2020-12-15: qty 1
  Filled 2020-12-15: qty 4
  Filled 2020-12-15: qty 1

## 2020-12-15 MED ORDER — ONDANSETRON HCL 4 MG PO TABS: 4.0000 mg | ORAL_TABLET | Freq: Four times a day (QID) | ORAL | Status: DC | PRN

## 2020-12-15 MED ORDER — ORAL CARE MOUTH RINSE
15.0000 mL | Freq: Two times a day (BID) | OROMUCOSAL | Status: DC
  Administered 2020-12-15 – 2020-12-18 (×7): 15 mL via OROMUCOSAL

## 2020-12-15 MED ORDER — AMIODARONE HCL 200 MG PO TABS
100.0000 mg | ORAL_TABLET | Freq: Every day | ORAL | Status: DC
  Administered 2020-12-16 – 2020-12-19 (×4): 100 mg via ORAL
  Filled 2020-12-15 (×5): qty 1

## 2020-12-15 MED ORDER — ACETAMINOPHEN 650 MG RE SUPP: 650.0000 mg | Freq: Four times a day (QID) | RECTAL | Status: DC | PRN

## 2020-12-15 MED ORDER — CHLORHEXIDINE GLUCONATE CLOTH 2 % EX PADS
6.0000 | MEDICATED_PAD | Freq: Every day | CUTANEOUS | Status: DC
  Administered 2020-12-16 – 2020-12-18 (×4): 6 via TOPICAL

## 2020-12-15 MED ORDER — OXYCODONE HCL 5 MG PO TABS
5.0000 mg | ORAL_TABLET | ORAL | Status: DC | PRN
  Administered 2020-12-16: 5 mg via ORAL
  Filled 2020-12-15: qty 1

## 2020-12-15 MED ORDER — ASPIRIN EC 81 MG PO TBEC
81.0000 mg | DELAYED_RELEASE_TABLET | Freq: Every day | ORAL | Status: DC
  Administered 2020-12-16: 81 mg via ORAL
  Filled 2020-12-15: qty 1

## 2020-12-15 MED ORDER — FUROSEMIDE 40 MG PO TABS: 40.0000 mg | ORAL_TABLET | Freq: Two times a day (BID) | ORAL | Status: DC

## 2020-12-15 MED ORDER — DONEPEZIL HCL 5 MG PO TABS
10.0000 mg | ORAL_TABLET | Freq: Every day | ORAL | Status: DC
  Administered 2020-12-15 – 2020-12-18 (×4): 10 mg via ORAL
  Filled 2020-12-15 (×6): qty 2

## 2020-12-15 MED ORDER — FUROSEMIDE 40 MG PO TABS
40.0000 mg | ORAL_TABLET | Freq: Two times a day (BID) | ORAL | Status: DC
  Administered 2020-12-16 – 2020-12-19 (×7): 40 mg via ORAL
  Filled 2020-12-15 (×4): qty 1
  Filled 2020-12-15 (×3): qty 2

## 2020-12-15 MED ORDER — MIRTAZAPINE 15 MG PO TABS
15.0000 mg | ORAL_TABLET | Freq: Every day | ORAL | Status: DC
  Administered 2020-12-15 – 2020-12-18 (×4): 15 mg via ORAL
  Filled 2020-12-15 (×4): qty 1

## 2020-12-15 MED ORDER — FAMOTIDINE 20 MG PO TABS
20.0000 mg | ORAL_TABLET | Freq: Two times a day (BID) | ORAL | Status: DC
  Administered 2020-12-15 – 2020-12-18 (×6): 20 mg via ORAL
  Filled 2020-12-15 (×6): qty 1

## 2020-12-15 MED ORDER — HYDRALAZINE HCL 20 MG/ML IJ SOLN: 10.0000 mg | INTRAMUSCULAR | Status: DC | PRN

## 2020-12-15 MED ORDER — DOCUSATE SODIUM 100 MG PO CAPS
100.0000 mg | ORAL_CAPSULE | Freq: Two times a day (BID) | ORAL | Status: DC
  Administered 2020-12-15 – 2020-12-19 (×8): 100 mg via ORAL
  Filled 2020-12-15 (×8): qty 1

## 2020-12-15 MED ORDER — OCUVITE-LUTEIN PO CAPS
1.0000 | ORAL_CAPSULE | Freq: Every day | ORAL | Status: DC
  Administered 2020-12-15 – 2020-12-19 (×5): 1 via ORAL
  Filled 2020-12-15 (×6): qty 1

## 2020-12-15 MED ORDER — ONDANSETRON HCL 4 MG/2ML IJ SOLN: 4.0000 mg | Freq: Four times a day (QID) | INTRAMUSCULAR | Status: DC | PRN

## 2020-12-15 MED ORDER — METOPROLOL SUCCINATE ER 25 MG PO TB24
25.0000 mg | ORAL_TABLET | Freq: Every day | ORAL | Status: DC
  Administered 2020-12-15 – 2020-12-18 (×4): 25 mg via ORAL
  Filled 2020-12-15 (×5): qty 1

## 2020-12-15 MED ORDER — ACETAMINOPHEN 325 MG PO TABS: 650.0000 mg | ORAL_TABLET | Freq: Four times a day (QID) | ORAL | Status: DC | PRN

## 2020-12-15 MED ORDER — CARBIDOPA-LEVODOPA 25-100 MG PO TABS
1.0000 | ORAL_TABLET | Freq: Three times a day (TID) | ORAL | Status: DC
  Administered 2020-12-16 – 2020-12-19 (×11): 1 via ORAL
  Filled 2020-12-15 (×13): qty 1

## 2020-12-15 MED ORDER — ALBUTEROL SULFATE (2.5 MG/3ML) 0.083% IN NEBU: 2.5000 mg | INHALATION_SOLUTION | RESPIRATORY_TRACT | Status: DC | PRN

## 2020-12-15 MED ORDER — MEMANTINE HCL 10 MG PO TABS
10.0000 mg | ORAL_TABLET | Freq: Two times a day (BID) | ORAL | Status: DC
  Administered 2020-12-15 – 2020-12-19 (×8): 10 mg via ORAL
  Filled 2020-12-15 (×9): qty 1

## 2020-12-15 MED ORDER — IOHEXOL 350 MG/ML SOLN
75.0000 mL | Freq: Once | INTRAVENOUS | Status: AC | PRN
  Administered 2020-12-15: 75 mL via INTRAVENOUS

## 2020-12-15 NOTE — Progress Notes (Signed)
Rapid Response Event Note   Reason for Call :   - Tachypnea  Initial Focused Assessment:   - A+O; HOH - RR in 40s - Minimal air movement in right lung, known pleural eff  Interventions:   - NP notified - Patient transferred to SDU  Plan of Care:   - Transfer, awaiting ordered Thoracentesis  Event Summary:   MD Notified: NP Sharion Settler Call Time: 1917 hrs Arrival Time: 1920 hrs End Time: 2000 hrs  Jesse Sans, RN

## 2020-12-15 NOTE — ED Notes (Signed)
IF pt released, may call daughter, Kyra Searles [phone] (cell)

## 2020-12-15 NOTE — ED Triage Notes (Signed)
Pt brought in via EMS from [facility] for shortness of breath x 3-4 days, worsening today.  Staff states pt is supposed to wear 3L [place], but has been non-compliant and was in the low 80's when they arrived on RA.

## 2020-12-15 NOTE — ED Notes (Signed)
Pt sitting up in bed, eating.

## 2020-12-15 NOTE — ED Provider Notes (Signed)
[facility] Emergency Department Provider Note   ____________________________________________   Event Date/Time   First MD Initiated Contact with Patient 12/15/20 1314     (approximate)  I have reviewed the triage vital signs and the nursing notes.   HISTORY  Chief Complaint Shortness of Breath   HPI Eric Hendricks is a 87 y.o. male with possible history of hypertension, hyperlipidemia, CHF, paroxysmal atrial fibrillation, Parkinson disease, and dementia who presents to the ED for shortness of breath.  Patient reports that he has been having increasing shortness of breath over the past couple of weeks that got much more severe over the past couple of days.  He reports significant difficulty breathing when trying to lay flat to go to sleep last night.  Staff at [facility] then called EMS when he appeared very short of breath while eating lunch.  Patient denies any fevers, cough, chest pain, pain or swelling in his legs.  He has required repeated thoracentesis in the past due to pleural effusion.  EMS reports patient was in the mid 80s on room air, subsequently improved on 3 L nasal cannula.        Past Medical History:  Diagnosis Date  . Arrhythmia    atrial fibrillation  . B12 deficiency   . CHF (congestive heart failure) ([place])   . Dementia ([place])   . Depression   . Hyperlipemia   . Parkinson's disease ([place])   . Prostate cancer [facility])     Patient Active Problem List   Diagnosis Date Noted  . Pleural effusion 12/15/2020  . Acute on chronic respiratory failure with hypoxia ([place]) 05/10/2020  . Acute on chronic systolic CHF (congestive heart failure) ([place]) 05/10/2020  . Pleural effusion, right 05/09/2020  . Acute on chronic systolic heart failure ([place]) 11/21/2019  . AF (paroxysmal atrial fibrillation) ([place]) 11/21/2019  . Depression 11/21/2019  . Dementia due to Parkinson's disease without behavioral disturbance ([place]) 11/21/2019    Past Surgical  History:  Procedure Laterality Date  . ANKLE SURGERY Right   . APPENDECTOMY    . CATARACT EXTRACTION Bilateral   . TRANSURETHRAL RESECTION OF PROSTATE      Prior to Admission medications   Medication Sig Start Date End Date Taking? Authorizing Provider  acetaminophen (TYLENOL) 325 MG tablet Take 650 mg by mouth in the morning and at bedtime.    [provider]  amiodarone (PACERONE) 200 MG tablet Take 100 mg by mouth daily at 6 (six) AM. 11/03/19   [provider]  ascorbic acid (VITAMIN C) 500 MG tablet Take 500 mg by mouth 3 (three) times daily.    [provider]  aspirin EC 81 MG tablet Take 81 mg by mouth daily.    [provider]  atorvastatin (LIPITOR) 80 MG tablet Take 80 mg by mouth every evening. 11/03/19   [provider]  carbidopa-levodopa (SINEMET IR) 25-100 MG tablet Take 1 tablet by mouth 3 (three) times daily. 1 tablet at 9am/1pm/5pm.  Please give meds 20 min prior to meal or 1 hour after the meal 08/29/20   Tat, Rebecca S, DO  divalproex (DEPAKOTE) 250 MG DR tablet Take 1 tablet (250 mg total) by mouth 3 (three) times daily. 05/11/20   Jennye Boroughs, MD  docusate sodium (COLACE) 100 MG capsule Take 100 mg by mouth 2 (two) times daily.    [provider]  donepezil (ARICEPT) 10 MG tablet TAKE 1 TABLET BY MOUTH AT BEDTIME Patient taking differently: Take 10  mg by mouth at bedtime. 05/02/19   Tat, Eustace Quail, DO  famotidine (PEPCID) 20 MG tablet Take 20 mg by mouth 2 (two) times daily.    [provider]  furosemide (LASIX) 40 MG tablet Take 1 tablet (40 mg total) by mouth 2 (two) times daily. 11/23/19   Dhungel, Nishant, MD  memantine (NAMENDA) 10 MG tablet TAKE 1 TABLET BY MOUTH TWICE A DAY Patient taking differently: Take 10 mg by mouth 2 (two) times daily. 05/02/19   Tat, Eustace Quail, DO  metoprolol succinate (TOPROL-XL) 25 MG 24 hr tablet Take 25 mg by mouth daily. 11/03/17   [provider]  mirtazapine  (REMERON) 15 MG tablet Take 15 mg by mouth at bedtime.    [provider]  multivitamin-lutein (OCUVITE-LUTEIN) CAPS capsule Take 1 capsule by mouth daily.    [provider]  OXYGEN Inhale 1 L into the lungs continuous.    [provider]    Allergies Phenylephrine-guaifenesin, Aspirin, Guaifenesin, Guanfacine, Phenylephrine, and Suprax [cefixime]  Family History  Problem Relation Age of Onset  . Colon cancer Father   . Tremor Mother   . Parkinson's disease Brother   . Prostate cancer Brother   . Lung cancer Sister   . Emphysema Sister   . Heart failure Child   . Diabetes Child   . AAA (abdominal aortic aneurysm) Child     Social History Social History   Tobacco Use  . Smoking status: Never Smoker  . Smokeless tobacco: Never Used  Vaping Use  . Vaping Use: Never used  Substance Use Topics  . Alcohol use: No    Alcohol/week: 0.0 standard drinks  . Drug use: No    Review of Systems  Constitutional: No fever/chills Eyes: No visual changes. ENT: No sore throat. Cardiovascular: Denies chest pain. Respiratory: Positive for shortness of breath. Gastrointestinal: No abdominal pain.  No nausea, no vomiting.  No diarrhea.  No constipation. Genitourinary: Negative for dysuria. Musculoskeletal: Negative for back pain. Skin: Negative for rash. Neurological: Negative for headaches, focal weakness or numbness.  ____________________________________________   PHYSICAL EXAM:  VITAL SIGNS: ED Triage Vitals  Enc Vitals Group     BP      Pulse      Resp      Temp      Temp src      SpO2      Weight      Height      Head Circumference      Peak Flow      Pain Score      Pain Loc      Pain Edu?      Excl. in [place]?     Constitutional: Alert and oriented. Eyes: Conjunctivae are normal. Head: Atraumatic. Nose: No congestion/rhinnorhea. Mouth/Throat: Mucous membranes are moist. Neck: Normal ROM Cardiovascular: Normal rate, regular rhythm.  Grossly normal heart sounds. Respiratory: Tachypneic with increased respiratory effort, diminished lung sounds on right, left lung fields clear to auscultation. Gastrointestinal: Soft and nontender. No distention. Genitourinary: deferred Musculoskeletal: No lower extremity tenderness nor edema. Neurologic:  Normal speech and language. No gross focal neurologic deficits are appreciated. Skin:  Skin is warm, dry and intact. No rash noted. Psychiatric: Mood and affect are normal. Speech and behavior are normal.  ____________________________________________   LABS (all labs ordered are listed, but only abnormal results are displayed)  Labs Reviewed  CBC WITH DIFFERENTIAL/PLATELET - Abnormal; Notable for the following components:      Result Value  RBC 3.96 (*)    Hemoglobin 12.2 (*)    HCT 38.8 (*)    RDW 17.1 (*)    Lymphs Abs 0.3 (*)    All other components within normal limits  BASIC METABOLIC PANEL - Abnormal; Notable for the following components:   Glucose, Bld 114 (*)    BUN 35 (*)    Creatinine, Ser 1.38 (*)    Calcium 8.5 (*)    GFR, Estimated 47 (*)    All other components within normal limits  BRAIN NATRIURETIC PEPTIDE - Abnormal; Notable for the following components:   B Natriuretic Peptide 756.3 (*)    All other components within normal limits  TROPONIN I (HIGH SENSITIVITY) - Abnormal; Notable for the following components:   Troponin I (High Sensitivity) 67 (*)    All other components within normal limits  SARS CORONAVIRUS 2 (TAT 6-24 HRS)  PROCALCITONIN  TROPONIN I (HIGH SENSITIVITY)   ____________________________________________   PROCEDURES  Procedure(s) performed (including Critical Care):  .Critical Care Performed by: Blake Divine, MD Authorized by: Blake Divine, MD   Critical care provider statement:    Critical care time (minutes):  45   Critical care time was exclusive of:  Separately billable procedures and treating other patients and  teaching time   Critical care was necessary to treat or prevent imminent or life-threatening deterioration of the following conditions:  Respiratory failure   Critical care was time spent personally by me on the following activities:  Discussions with consultants, evaluation of patient's response to treatment, examination of patient, ordering and performing treatments and interventions, ordering and review of laboratory studies, ordering and review of radiographic studies, pulse oximetry, re-evaluation of patient's condition, obtaining history from patient or surrogate and review of old charts   I assumed direction of critical care for this patient from another provider in my specialty: no     Care discussed with: admitting provider       ____________________________________________   INITIAL IMPRESSION / ASSESSMENT AND PLAN / ED COURSE       87 year old male with past medical history of hypertension, hyperlipidemia, CHF, atrial fibrillation, Parkinson disease, and dementia who presents to the ED with increasing difficulty breathing over the past couple of weeks, acutely worse over the past couple of days.  Patient is tachypneic with increased respiratory effort, lung sounds significantly diminished on right and I suspect he has recurrent pleural effusion.  We will further assess with EKG, chest x-ray, and labs.  Patient maintaining O2 sats on 3 L nasal cannula for now.  Chest x-ray reviewed by me and confirms large right-sided pleural effusion.  I suspect this is related to CHF as patient has no fever or leukocytosis.  He is breathing comfortably on 3 L nasal cannula for now and would benefit from admission for thoracentesis.  We will hold off on antibiotics, case discussed with hospitalist for admission.      ____________________________________________   FINAL CLINICAL IMPRESSION(S) / ED DIAGNOSES  Final diagnoses:  Pleural effusion  Acute respiratory failure with hypoxia [facility])      ED Discharge Orders    None       Note:  This document was prepared using Dragon voice recognition software and may include unintentional dictation errors.   Blake Divine, MD 12/15/20 [phone]

## 2020-12-15 NOTE — Progress Notes (Addendum)
Patient arrived to the unit from ED, vitals taken and patient had a  MEWS score of 4, rapid response called, ICU RN arrived to the unit, Sharion Settler, FNP aware, & patient transferred to ICU room 7. Patient's daughter aware.

## 2020-12-15 NOTE — H&P (Addendum)
History and Physical    Eric Hendricks ZSW:[id number] DOB: 09-08-1924 DOA: 12/15/2020  PCP: [name], MD   Patient coming from: [name]   Chief Complaint: Shortness of breath   HPI: Eric Hendricks is a 87 y.o. male with medical history significant of coronary artery disease, chronic systolic congestive heart failure, chronic hypoxic respiratory failure on 3 L paroxysmal atrial fibrillation on anticoagulation, Parkinson disease on Sinemet who presents for evaluation of shortness of breath worsening over 3 days.  Patient apparently lives at [facility] [place] and is supposed to be wearing his 3 L oxygen all time.  His adherence is somewhat unclear.  The patient is a poor historian so majority of history is gained by speaking with family members over phone, speak with the ER physician and reading the medical record.  Per the patient's son the patient has a history of recurrent right-sided pleural effusion and per his son has required for thoracentesis over the past few months.  Etiology of the recurrent pleural effusions is as yet unknown and apparently the patient has been recommended to see thoracic surgery as an outpatient.  On arrival the patient is hemodynamically stable however chest x-ray demonstrating large right pleural effusion.  The patient is tachypneic but mentating relatively clearly and is afebrile.  Results are inconsistent with a parapneumonic effusion.  After x-ray findings hospitalist was contacted for admission  ED Course: Patient was given 40 mg IV Lasix after chest x-ray revealed large right pleural effusion.  Hospitalist contacted for admission  Review of Systems: As per HPI otherwise 14 point review of systems negative  Past Medical History:  Diagnosis Date  . Arrhythmia    atrial fibrillation  . B12 deficiency   . CHF (congestive heart failure) ([place])   . Dementia ([place])   . Depression   . Hyperlipemia   . Parkinson's disease ([place])   . Prostate cancer [facility])      Past Surgical History:  Procedure Laterality Date  . ANKLE SURGERY Right   . APPENDECTOMY    . CATARACT EXTRACTION Bilateral   . TRANSURETHRAL RESECTION OF PROSTATE       reports that he has never smoked. He has never used smokeless tobacco. He reports that he does not drink alcohol and does not use drugs.  Allergies  Allergen Reactions  . Phenylephrine-Guaifenesin Palpitations  . Aspirin Other (See Comments)  . Guaifenesin Other (See Comments)    Unknown reaction  . Guanfacine   . Phenylephrine   . Suprax [Cefixime]     Family History  Problem Relation Age of Onset  . Colon cancer Father   . Tremor Mother   . Parkinson's disease Brother   . Prostate cancer Brother   . Lung cancer Sister   . Emphysema Sister   . Heart failure Child   . Diabetes Child   . AAA (abdominal aortic aneurysm) Child    No family history of recurrent pleural effusions  Prior to Admission medications   Medication Sig Start Date End Date Taking? Authorizing Provider  acetaminophen (TYLENOL) 325 MG tablet Take 650 mg by mouth in the morning and at bedtime.    [provider]  amiodarone (PACERONE) 200 MG tablet Take 100 mg by mouth daily at 6 (six) AM. 11/03/19   [provider]  ascorbic acid (VITAMIN C) 500 MG tablet Take 500 mg by mouth 3 (three) times daily.    [provider]  aspirin EC 81 MG tablet Take 81 mg by mouth daily.  [provider]  atorvastatin (LIPITOR) 80 MG tablet Take 80 mg by mouth every evening. 11/03/19   [provider]  carbidopa-levodopa (SINEMET IR) 25-100 MG tablet Take 1 tablet by mouth 3 (three) times daily. 1 tablet at 9am/1pm/5pm.  Please give meds 20 min prior to meal or 1 hour after the meal 08/29/20   Tat, Rebecca S, DO  divalproex (DEPAKOTE) 250 MG DR tablet Take 1 tablet (250 mg total) by mouth 3 (three) times daily. 05/11/20   Jennye Boroughs, MD  docusate sodium (COLACE) 100 MG capsule Take 100 mg by mouth 2  (two) times daily.    [provider]  donepezil (ARICEPT) 10 MG tablet TAKE 1 TABLET BY MOUTH AT BEDTIME Patient taking differently: Take 10 mg by mouth at bedtime. 05/02/19   Tat, Eustace Quail, DO  famotidine (PEPCID) 20 MG tablet Take 20 mg by mouth 2 (two) times daily.    [provider]  furosemide (LASIX) 40 MG tablet Take 1 tablet (40 mg total) by mouth 2 (two) times daily. 11/23/19   Dhungel, Nishant, MD  memantine (NAMENDA) 10 MG tablet TAKE 1 TABLET BY MOUTH TWICE A DAY Patient taking differently: Take 10 mg by mouth 2 (two) times daily. 05/02/19   Tat, Eustace Quail, DO  metoprolol succinate (TOPROL-XL) 25 MG 24 hr tablet Take 25 mg by mouth daily. 11/03/17   [provider]  mirtazapine (REMERON) 15 MG tablet Take 15 mg by mouth at bedtime.    [provider]  multivitamin-lutein (OCUVITE-LUTEIN) CAPS capsule Take 1 capsule by mouth daily.    [provider]  OXYGEN Inhale 1 L into the lungs continuous.    [provider]    Physical Exam: Vitals:   12/15/20 1324 12/15/20 1325  BP:  107/76  Pulse:  (!) 106  Resp:  (!) 32  Temp:  97.7 F (36.5 C)  TempSrc:  Oral  SpO2: 96% 97%  Weight:  63.5 kg  Height:  5\' 8"  (1.727 m)     Vitals:   12/15/20 1324 12/15/20 1325  BP:  107/76  Pulse:  (!) 106  Resp:  (!) 32  Temp:  97.7 F (36.5 C)  TempSrc:  Oral  SpO2: 96% 97%  Weight:  63.5 kg  Height:  5\' 8"  (1.727 m)   Constitutional: No acute distress.  Appears stated age.  Appears frail Eyes: PERRL, lids and conjunctivae normal ENMT: Mucous membranes are moist. Posterior pharynx clear of any exudate or lesions.poor dentition.  Neck: normal, supple, no masses, no thyromegaly Respiratory: Lung sounds decreased on the right.  Increased work of breathing.  Tachypneic.  3 L Cardiovascular: Tachycardic, regular rhythm, no murmurs, no pedal edema Abdomen: no tenderness, no masses palpated. No hepatosplenomegaly. Bowel sounds positive.   Musculoskeletal: no clubbing / cyanosis. No joint deformity upper and lower extremities. Good ROM, no contractures. Normal muscle tone.  Skin: no rashes, lesions, ulcers. No induration Neurologic: Cranial nerves grossly intact, sensation intact, strength 5 psychiatric: Normal judgment and insight.  Alert oriented x2, normal mood  Labs on Admission: I have personally reviewed following labs and imaging studies  CBC: Recent Labs  Lab 12/15/20 1330  WBC 6.2  NEUTROABS 5.1  HGB 12.2*  HCT 38.8*  MCV 98.0  PLT 660   Basic Metabolic Panel: Recent Labs  Lab 12/15/20 1330  NA 139  K 4.4  CL 101  CO2 28  GLUCOSE 114*  BUN 35*  CREATININE 1.38*  CALCIUM 8.5*  GFR: Estimated Creatinine Clearance: 29.4 mL/min (A) (by C-G formula based on SCr of 1.38 mg/dL (H)). Liver Function Tests: No results for input(s): AST, ALT, ALKPHOS, BILITOT, PROT, ALBUMIN in the last 168 hours. No results for input(s): LIPASE, AMYLASE in the last 168 hours. No results for input(s): AMMONIA in the last 168 hours. Coagulation Profile: No results for input(s): INR, PROTIME in the last 168 hours. Cardiac Enzymes: No results for input(s): CKTOTAL, CKMB, CKMBINDEX, TROPONINI in the last 168 hours. BNP (last 3 results) No results for input(s): PROBNP in the last 8760 hours. HbA1C: No results for input(s): HGBA1C in the last 72 hours. CBG: No results for input(s): GLUCAP in the last 168 hours. Lipid Profile: No results for input(s): CHOL, HDL, LDLCALC, TRIG, CHOLHDL, LDLDIRECT in the last 72 hours. Thyroid Function Tests: No results for input(s): TSH, T4TOTAL, FREET4, T3FREE, THYROIDAB in the last 72 hours. Anemia Panel: No results for input(s): VITAMINB12, FOLATE, FERRITIN, TIBC, IRON, RETICCTPCT in the last 72 hours. Urine analysis:    Component Value Date/Time   COLORURINE YELLOW 09/26/2020 1331   APPEARANCEUR CLEAR 09/26/2020 1331   LABSPEC 1.020 09/26/2020 1331   PHURINE 6.5 09/26/2020 1331    GLUCOSEU NEGATIVE 09/26/2020 1331   HGBUR NEGATIVE 09/26/2020 1331   BILIRUBINUR NEGATIVE 09/26/2020 1331   KETONESUR NEGATIVE 09/26/2020 1331   PROTEINUR NEGATIVE 09/26/2020 1331   NITRITE NEGATIVE 09/26/2020 1331   LEUKOCYTESUR SMALL (A) 09/26/2020 1331    Radiological Exams on Admission: DG Chest 2 View  Result Date: 12/15/2020 CLINICAL DATA:  Shortness of breath. EXAM: CHEST - 2 VIEW COMPARISON:  November 04, 2020. FINDINGS: Stable cardiomediastinal silhouette. Left-sided pacemaker is unchanged in position. No pneumothorax is noted. Interval development of moderate right pleural effusion with associated atelectasis or pneumonia. Bony thorax is unremarkable. IMPRESSION: Interval development of moderate right pleural effusion with associated atelectasis or pneumonia. Aortic Atherosclerosis (ICD10-I70.0). Electronically Signed   By: Marijo Conception M.D.   On: 12/15/2020 13:57    EKG: Independently reviewed.  Sinus tachycardia  Assessment/Plan Active Problems:   Pleural effusion   Right-sided pleural effusion Per the patient's son he has had a history of recurrent pleural effusions requiring thoracentesis x4 Etiology is yet unclear Felt to be related to underlying heart failure Was previously recommended to see thoracic surgery as outpatient Previous thoracentesis revealed lymphocytic predominant effusion Plan: Place in observation Ultrasound-guided thoracentesis ordered Hold chemoprophylaxis Supplemental oxygen as necessary Continue Lasix 40 mg p.o. twice daily per home dose Pulmonary Dr. Lanney Gins consulted for recommendations  Chronic systolic congestive heart failure Questionable whether there is true exacerbation No lower extremity edema on exam Majority of fluid on chest x-ray seen is pleural effusion Apparently patient's not responded to Lasix in the past Plan: Continue Lasix per home dose Continue metoprolol per home dose Daily weights Strict I's and  O's  Chronic atrial fibrillation Patient does not appear to be on anticoagulation, presumably due to fall risk Continue amiodarone  Chronic hypoxic respiratory failure Patient currently on baseline 3 L oxygen, continue  Alzheimer's dementia Continue home Aricept and Namenda  Insomnia Home Remeron  Coronary artery disease Hyperlipidemia Continue home statin Continue aspirin 81 mg daily  Parkinson's disease Continue home Sinemet  DVT prophylaxis: SCDs  code Status: FULL CODE for now.  We will need to revisit with patient and family.  Patient expressed desire not to be resuscitated if it would not save his life however his decision-making capability is not entirely clear at this moment.  I  did speak with his son but was unable to discuss this topic specifically.  Palliative care on consult, recommendations appreciated for goals of care. Family Communication: Patient's son [name] [phone] on 3/28 Disposition Plan: Anticipate return to previous living environment  consults called: Palliative care, ultrasound, Pulmonary Admission status: Observation, MedSurg   Sidney Ace MD [facility]   If 7PM-7AM, please contact night-coverage   12/15/2020, 2:59 PM

## 2020-12-16 ENCOUNTER — Observation Stay: Payer: Medicare Other

## 2020-12-16 ENCOUNTER — Ambulatory Visit: Payer: Medicare Other

## 2020-12-16 DIAGNOSIS — I130 Hypertensive heart and chronic kidney disease with heart failure and stage 1 through stage 4 chronic kidney disease, or unspecified chronic kidney disease: Secondary | ICD-10-CM | POA: Diagnosis present

## 2020-12-16 DIAGNOSIS — G309 Alzheimer's disease, unspecified: Secondary | ICD-10-CM | POA: Diagnosis present

## 2020-12-16 DIAGNOSIS — E785 Hyperlipidemia, unspecified: Secondary | ICD-10-CM | POA: Diagnosis present

## 2020-12-16 DIAGNOSIS — J9621 Acute and chronic respiratory failure with hypoxia: Secondary | ICD-10-CM | POA: Diagnosis present

## 2020-12-16 DIAGNOSIS — R0602 Shortness of breath: Secondary | ICD-10-CM | POA: Diagnosis present

## 2020-12-16 DIAGNOSIS — I482 Chronic atrial fibrillation, unspecified: Secondary | ICD-10-CM | POA: Diagnosis present

## 2020-12-16 DIAGNOSIS — I480 Paroxysmal atrial fibrillation: Secondary | ICD-10-CM | POA: Diagnosis present

## 2020-12-16 DIAGNOSIS — I5022 Chronic systolic (congestive) heart failure: Secondary | ICD-10-CM | POA: Diagnosis present

## 2020-12-16 DIAGNOSIS — Z79899 Other long term (current) drug therapy: Secondary | ICD-10-CM | POA: Diagnosis not present

## 2020-12-16 DIAGNOSIS — Z888 Allergy status to other drugs, medicaments and biological substances status: Secondary | ICD-10-CM | POA: Diagnosis not present

## 2020-12-16 DIAGNOSIS — Z881 Allergy status to other antibiotic agents status: Secondary | ICD-10-CM | POA: Diagnosis not present

## 2020-12-16 DIAGNOSIS — G4700 Insomnia, unspecified: Secondary | ICD-10-CM | POA: Diagnosis present

## 2020-12-16 DIAGNOSIS — Z7189 Other specified counseling: Secondary | ICD-10-CM | POA: Diagnosis not present

## 2020-12-16 DIAGNOSIS — I2510 Atherosclerotic heart disease of native coronary artery without angina pectoris: Secondary | ICD-10-CM | POA: Diagnosis present

## 2020-12-16 DIAGNOSIS — Z7901 Long term (current) use of anticoagulants: Secondary | ICD-10-CM | POA: Diagnosis not present

## 2020-12-16 DIAGNOSIS — J9601 Acute respiratory failure with hypoxia: Secondary | ICD-10-CM | POA: Diagnosis not present

## 2020-12-16 DIAGNOSIS — Z515 Encounter for palliative care: Secondary | ICD-10-CM | POA: Diagnosis not present

## 2020-12-16 DIAGNOSIS — Z9981 Dependence on supplemental oxygen: Secondary | ICD-10-CM | POA: Diagnosis not present

## 2020-12-16 DIAGNOSIS — J90 Pleural effusion, not elsewhere classified: Secondary | ICD-10-CM | POA: Diagnosis present

## 2020-12-16 DIAGNOSIS — Z820 Family history of epilepsy and other diseases of the nervous system: Secondary | ICD-10-CM | POA: Diagnosis not present

## 2020-12-16 DIAGNOSIS — Z8546 Personal history of malignant neoplasm of prostate: Secondary | ICD-10-CM | POA: Diagnosis not present

## 2020-12-16 DIAGNOSIS — Z886 Allergy status to analgesic agent status: Secondary | ICD-10-CM | POA: Diagnosis not present

## 2020-12-16 DIAGNOSIS — Z8249 Family history of ischemic heart disease and other diseases of the circulatory system: Secondary | ICD-10-CM | POA: Diagnosis not present

## 2020-12-16 DIAGNOSIS — I509 Heart failure, unspecified: Secondary | ICD-10-CM | POA: Diagnosis not present

## 2020-12-16 DIAGNOSIS — F0280 Dementia in other diseases classified elsewhere without behavioral disturbance: Secondary | ICD-10-CM | POA: Diagnosis present

## 2020-12-16 DIAGNOSIS — Z9889 Other specified postprocedural states: Secondary | ICD-10-CM | POA: Diagnosis not present

## 2020-12-16 DIAGNOSIS — Z66 Do not resuscitate: Secondary | ICD-10-CM | POA: Diagnosis present

## 2020-12-16 DIAGNOSIS — Z7982 Long term (current) use of aspirin: Secondary | ICD-10-CM | POA: Diagnosis not present

## 2020-12-16 DIAGNOSIS — G20 Parkinson's disease: Secondary | ICD-10-CM | POA: Diagnosis present

## 2020-12-16 DIAGNOSIS — Z20822 Contact with and (suspected) exposure to covid-19: Secondary | ICD-10-CM | POA: Diagnosis present

## 2020-12-16 LAB — BASIC METABOLIC PANEL
Anion gap: 8 (ref 5–15)
BUN: 30 mg/dL — ABNORMAL HIGH (ref 8–23)
CO2: 33 mmol/L — ABNORMAL HIGH (ref 22–32)
Calcium: 8.7 mg/dL — ABNORMAL LOW (ref 8.9–10.3)
Chloride: 101 mmol/L (ref 98–111)
Creatinine, Ser: 1.29 mg/dL — ABNORMAL HIGH (ref 0.61–1.24)
GFR, Estimated: 51 mL/min — ABNORMAL LOW (ref >60)
Glucose, Bld: 104 mg/dL — ABNORMAL HIGH (ref 70–99)
Potassium: 4.8 mmol/L (ref 3.5–5.1)
Sodium: 142 mmol/L (ref 135–145)

## 2020-12-16 LAB — CBC
HCT: 39.9 % (ref 39.0–52.0)
Hemoglobin: 12.6 g/dL — ABNORMAL LOW (ref 13.0–17.0)
MCH: 31.3 pg (ref 26.0–34.0)
MCHC: 31.6 g/dL (ref 30.0–36.0)
MCV: 99.3 fL (ref 80.0–100.0)
Platelets: 208 10*3/uL (ref 150–400)
RBC: 4.02 MIL/uL — ABNORMAL LOW (ref 4.22–5.81)
RDW: 17.2 % — ABNORMAL HIGH (ref 11.5–15.5)
WBC: 5.1 10*3/uL (ref 4.0–10.5)
nRBC: 0 % (ref 0.0–0.2)

## 2020-12-16 LAB — BODY FLUID CELL COUNT WITH DIFFERENTIAL
Eos, Fluid: 0 %
Lymphs, Fluid: 85 %
Monocyte-Macrophage-Serous Fluid: 12 %
Neutrophil Count, Fluid: 3 %
Total Nucleated Cell Count, Fluid: 164 cu mm

## 2020-12-16 LAB — T4, FREE: Free T4: 0.78 ng/dL (ref 0.61–1.12)

## 2020-12-16 LAB — ALBUMIN, PLEURAL OR PERITONEAL FLUID: Albumin, Fluid: 1.9 g/dL

## 2020-12-16 LAB — LACTATE DEHYDROGENASE, PLEURAL OR PERITONEAL FLUID: LD, Fluid: 262 U/L — ABNORMAL HIGH (ref 3–23)

## 2020-12-16 LAB — SARS CORONAVIRUS 2 (TAT 6-24 HRS): SARS Coronavirus 2: NEGATIVE

## 2020-12-16 LAB — TRIGLYCERIDES: Triglycerides: 78 mg/dL (ref <150)

## 2020-12-16 LAB — BRAIN NATRIURETIC PEPTIDE: B Natriuretic Peptide: 754.7 pg/mL — ABNORMAL HIGH (ref 0.0–100.0)

## 2020-12-16 NOTE — Plan of Care (Signed)
  Problem: Education: Goal: Knowledge of General Education information will improve Description Including pain rating scale, medication(s)/side effects and non-pharmacologic comfort measures Outcome: Progressing   Problem: Health Behavior/Discharge Planning: Goal: Ability to manage health-related needs will improve Outcome: Progressing   

## 2020-12-16 NOTE — Procedures (Signed)
Interventional Radiology Procedure Note  Procedure: US guided right thoracentesis  Complications: None  Estimated Blood Loss: None  Findings: 2 L of grossly bloody fluid removed from right pleural space. Procedure stopped short of complete drainage due to development of significant right chest pain. Post CXR pending.  Venetia Night. Kathlene Cote, M.D Pager:  [phone]

## 2020-12-16 NOTE — Consult Note (Signed)
Pulmonary Medicine          Date: 12/16/2020,   MRN# [id number] Eric Hendricks [birth year]     AdmissionWeight: 63.5 kg                 CurrentWeight: 63.5 kg  Referring physician: Dr Priscella Mann   CHIEF COMPLAINT:   Recurrent Right pleural effusion.     HISTORY OF PRESENT ILLNESS   As per admission h/p Eric Hendricks is a 87 y.o. male with medical history significant of coronary artery disease, chronic systolic congestive heart failure, chronic hypoxic respiratory failure on 3 L paroxysmal atrial fibrillation on anticoagulation, Parkinson disease on Sinemet who presents for evaluation of shortness of breath worsening over 3 days.  Patient apparently lives at [facility] [place] and is supposed to be wearing his 3 L oxygen all time.  His adherence is somewhat unclear. He has had 3 thoracentesis in the past.  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  He does have significant cardiac disease and is on chronic diuresis.     PAST MEDICAL HISTORY   Past Medical History:  Diagnosis Date  . Arrhythmia    atrial fibrillation  . B12 deficiency   . CHF (congestive heart failure) ([place])   . Dementia ([place])   . Depression   . Hyperlipemia   . Parkinson's disease ([place])   . Prostate cancer [facility])      SURGICAL HISTORY   Past Surgical History:  Procedure Laterality Date  . ANKLE SURGERY Right   . APPENDECTOMY    . CATARACT EXTRACTION Bilateral   . TRANSURETHRAL RESECTION OF PROSTATE       FAMILY HISTORY   Family History  Problem Relation Age of Onset  . Colon cancer Father   . Tremor Mother   . Parkinson's disease Brother   . Prostate cancer Brother   . Lung cancer Sister   . Emphysema Sister   . Heart failure Child   . Diabetes Child   . AAA (abdominal aortic aneurysm) Child      SOCIAL HISTORY   Social History   Tobacco Use  .  Smoking status: Never Smoker  . Smokeless tobacco: Never Used  Vaping Use  . Vaping Use: Never used  Substance Use Topics  . Alcohol use: No    Alcohol/week: 0.0 standard drinks  . Drug use: No     MEDICATIONS    Home Medication:    Current Medication:  Current Facility-Administered Medications:  .  acetaminophen (TYLENOL) tablet 650 mg, 650 mg, Oral, Q6H PRN **OR** acetaminophen (TYLENOL) suppository 650 mg, 650 mg, Rectal, Q6H PRN, Sreenath, Sudheer B, MD .  albuterol (PROVENTIL) (2.5 MG/3ML) 0.083% nebulizer solution 2.5 mg, 2.5 mg, Nebulization, Q2H PRN, Sreenath, Sudheer B, MD .  amiodarone (PACERONE) tablet 100 mg, 100 mg, Oral, Q0600, Sreenath, Sudheer B, MD, 100 mg at 12/16/20 0900 .  aspirin EC tablet 81 mg, 81 mg, Oral, Daily, Sreenath, Sudheer B, MD, 81 mg at 12/16/20 0903 .  atorvastatin (LIPITOR) tablet 80 mg, 80 mg, Oral, QPM, Sreenath, Sudheer B, MD, 80 mg at 12/15/20 2333 .  carbidopa-levodopa (SINEMET IR) 25-100 MG per tablet immediate release 1 tablet, 1 tablet, Oral, TID, Priscella Mann, Sudheer B, MD, 1 tablet at 12/16/20 0909 .  Chlorhexidine Gluconate Cloth 2 % PADS 6 each, 6 each, Topical, Daily, Sreenath, Sudheer B, MD, 6 each at  12/16/20 0234 .  divalproex (DEPAKOTE) DR tablet 250 mg, 250 mg, Oral, TID, Priscella Mann, Sudheer B, MD, 250 mg at 12/16/20 0909 .  docusate sodium (COLACE) capsule 100 mg, 100 mg, Oral, BID, Sreenath, Sudheer B, MD, 100 mg at 12/16/20 0903 .  donepezil (ARICEPT) tablet 10 mg, 10 mg, Oral, QHS, Sreenath, Sudheer B, MD, 10 mg at 12/15/20 2322 .  famotidine (PEPCID) tablet 20 mg, 20 mg, Oral, BID, Priscella Mann, Sudheer B, MD, 20 mg at 12/16/20 0903 .  furosemide (LASIX) tablet 40 mg, 40 mg, Oral, BID, Sharion Settler, NP, 40 mg at 12/16/20 0903 .  hydrALAZINE (APRESOLINE) injection 10 mg, 10 mg, Intravenous, Q4H PRN, Priscella Mann, Sudheer B, MD .  MEDLINE mouth rinse, 15 mL, Mouth Rinse, BID, Sreenath, Sudheer B, MD, 15 mL at 12/16/20 0904 .  memantine  (NAMENDA) tablet 10 mg, 10 mg, Oral, BID, Priscella Mann, Sudheer B, MD, 10 mg at 12/16/20 0909 .  metoprolol succinate (TOPROL-XL) 24 hr tablet 25 mg, 25 mg, Oral, Daily, Sreenath, Sudheer B, MD, 25 mg at 12/16/20 0904 .  mirtazapine (REMERON) tablet 15 mg, 15 mg, Oral, QHS, Sreenath, Sudheer B, MD, 15 mg at 12/15/20 2321 .  multivitamin-lutein (OCUVITE-LUTEIN) capsule 1 capsule, 1 capsule, Oral, Daily, Sreenath, Sudheer B, MD, 1 capsule at 12/16/20 0909 .  ondansetron (ZOFRAN) tablet 4 mg, 4 mg, Oral, Q6H PRN **OR** ondansetron (ZOFRAN) injection 4 mg, 4 mg, Intravenous, Q6H PRN, Sreenath, Sudheer B, MD .  oxyCODONE (Oxy IR/ROXICODONE) immediate release tablet 5 mg, 5 mg, Oral, Q4H PRN, Sreenath, Sudheer B, MD    ALLERGIES   Phenylephrine-guaifenesin, Aspirin, Guaifenesin, Guanfacine, Phenylephrine, and Suprax [cefixime]     REVIEW OF SYSTEMS    Review of Systems:  Gen:  Denies  fever, sweats, chills weigh loss  HEENT: Denies blurred vision, double vision, ear pain, eye pain, hearing loss, nose bleeds, sore throat Cardiac:  No dizziness, chest pain or heaviness, chest tightness,edema Resp:   Denies cough or sputum porduction, shortness of breath,wheezing, hemoptysis,  Gi: Denies swallowing difficulty, stomach pain, nausea or vomiting, diarrhea, constipation, bowel incontinence Gu:  Denies bladder incontinence, burning urine Ext:   Denies Joint pain, stiffness or swelling Skin: Denies  skin rash, easy bruising or bleeding or hives Endoc:  Denies polyuria, polydipsia , polyphagia or weight change Psych:   Denies depression, insomnia or hallucinations   Other:  All other systems negative   VS: BP 114/76   Pulse 78   Temp 97.8 F (36.6 C) (Oral)   Resp (!) 35   Ht 5\' 8"  (1.727 m)   Wt 63.5 kg   SpO2 97%   BMI 21.29 kg/m      PHYSICAL EXAM    GENERAL:NAD, no fevers, chills, no weakness no fatigue HEAD: Normocephalic, atraumatic. +hearing loss EYES: Pupils equal, round,  reactive to light. Extraocular muscles intact. No scleral icterus.  MOUTH: Moist mucosal membrane. Dentition intact. No abscess noted.  EAR, NOSE, THROAT: Clear without exudates. No external lesions.  NECK: Supple. No thyromegaly. No nodules. No JVD.  PULMONARY: Decreased breath sounds on right CARDIOVASCULAR: S1 and S2. Regular rate and rhythm. No murmurs, rubs, or gallops. No edema. Pedal pulses 2+ bilaterally.  GASTROINTESTINAL: Soft, nontender, nondistended. No masses. Positive bowel sounds. No hepatosplenomegaly.  MUSCULOSKELETAL: No swelling, clubbing, or edema. Range of motion full in all extremities.  NEUROLOGIC: Cranial nerves II through XII are intact. No gross focal neurological deficits. Sensation intact. Reflexes intact.  SKIN: No ulceration, lesions, rashes, or cyanosis. Skin warm  and dry. Turgor intact.  PSYCHIATRIC: Mood, affect within normal limits. The patient is awake, alert and oriented x 3. Insight, judgment intact.       IMAGING    DG Chest 2 View  Result Date: 12/15/2020 CLINICAL DATA:  Shortness of breath. EXAM: CHEST - 2 VIEW COMPARISON:  November 04, 2020. FINDINGS: Stable cardiomediastinal silhouette. Left-sided pacemaker is unchanged in position. No pneumothorax is noted. Interval development of moderate right pleural effusion with associated atelectasis or pneumonia. Bony thorax is unremarkable. IMPRESSION: Interval development of moderate right pleural effusion with associated atelectasis or pneumonia. Aortic Atherosclerosis (ICD10-I70.0). Electronically Signed   By: Marijo Conception M.D.   On: 12/15/2020 13:57   CT ANGIO CHEST PE W OR WO CONTRAST  Result Date: 12/15/2020 CLINICAL DATA:  Elevated D-dimer, dyspnea EXAM: CT ANGIOGRAPHY CHEST WITH CONTRAST TECHNIQUE: Multidetector CT imaging of the chest was performed using the standard protocol during bolus administration of intravenous contrast. Multiplanar CT image reconstructions and MIPs were obtained to  evaluate the vascular anatomy. CONTRAST:  37mL OMNIPAQUE IOHEXOL 350 MG/ML SOLN COMPARISON:  12/13/2018 FINDINGS: Cardiovascular: There is adequate opacification of the pulmonary arterial tree. There is no intraluminal filling defect identified to suggest acute pulmonary embolism. There is relative hypoenhancement of the right pulmonary arterial tree, likely related to vascular shunting secondary to subtotal collapse of the right lung. The central pulmonary arteries are of normal caliber. Moderate multi-vessel coronary artery calcification with possible stenting of the left anterior descending coronary artery. Global cardiac size is within normal limits. Left subclavian pacemaker leads are seen within the right atrium and right ventricle toward the apex. No pericardial effusion. Moderate atherosclerotic calcification within the thoracic aorta. No aortic aneurysm. Mediastinum/Nodes: No pathologic thoracic adenopathy. Visualized thyroid is unremarkable. Esophagus is unremarkable. Lungs/Pleura: There is near complete collapse of the right lung secondary to compressive atelectasis of a large right pleural effusion. The right middle and lower lobes are completely collapsed. There is partial aeration of the right upper lobe. No central obstructing mass is identified. The costophrenic angle is excluded from view on this examination. Small left pleural effusion is present with mild compressive atelectasis of the left lower lobe. No superimposed focal pulmonary infiltrate. No pneumothorax. Upper Abdomen: No acute abnormality within the visualized upper abdomen. Musculoskeletal: No acute bone abnormality. No lytic or blastic bone lesion identified. Review of the MIP images confirms the above findings. IMPRESSION: No pulmonary embolism. Large right pleural effusion with subtotal collapse of the right lung. Small left pleural effusion also identified. Moderate coronary artery calcification. Aortic Atherosclerosis (ICD10-I70.0).  Electronically Signed   By: Fidela Salisbury MD   On: 12/15/2020 23:25      ASSESSMENT/PLAN   Recurrent right pleural effusion   -profile with lymphocyte predominant exudate by LDH, this is likely due to cardiac and renal failure  - his BNP is elevated > 700 - pH is mildly elevated sometimes can be seen in chylous effusion and should check triglycerides and chilomicrons on next thoracentesis but points away from infection -Additional more rare possibilties of lymphocyte predominat pleural effusion include Uremia from CKD, radiation therapy, Sarcoidosis, TB, chronic autoimmune disease - since cytology was negative this is unlikely malignancy but we will check again with repeat thoracentsis -would recommend tunneled pleural catheter as palliation of recurrent fluid for this nanogenerian as he is likely to have a painful and prolonged hospitalization if considering pleurodesis.  -will continue to follow. -will add additional studies to ordered thoracentesis including - pleural fluid BNP,  AFB, Triglyceride and chilomicron examination       Thank you for allowing me to participate in the care of this patient.   Patient/Family are satisfied with care plan and all questions have been answered.  This document was prepared using Dragon voice recognition software and may include unintentional dictation errors.     Ottie Glazier, M.D.  Division of [facility]

## 2020-12-16 NOTE — Progress Notes (Signed)
PROGRESS NOTE    Eric Hendricks  JXB:[id number] DOB: Apr 01, 1924 DOA: 12/15/2020 PCP: Eric Hartigan, MD  Brief Narrative:  87 y.o. male with medical history significant of coronary artery disease, chronic systolic congestive heart failure, chronic hypoxic respiratory failure on 3 L paroxysmal atrial fibrillation on anticoagulation, Parkinson disease on Sinemet who presents for evaluation of shortness of breath worsening over 3 days.  Patient apparently lives at [facility] and is supposed to be wearing his 3 L oxygen all time.  His adherence is somewhat unclear.  The patient is a poor historian so majority of history is gained by speaking with family members over phone, speak with the ER physician and reading the medical record.  Per the patient's son the patient has a history of recurrent right-sided pleural effusion and per his son has required for thoracentesis over the past few months.  Etiology of the recurrent pleural effusions is as yet unknown and apparently the patient has been recommended to see thoracic surgery as an outpatient.  On arrival the patient is hemodynamically stable however chest x-ray demonstrating large right pleural effusion.  The patient is tachypneic but mentating relatively clearly and is afebrile.    3/29: Overnight the patient was found to be increasingly tachypneic with increasing oxygen requirement was transferred to stepdown unit.  Transfer orders placed back to PCU early this morning.  Patient underwent image guided right-sided thoracentesis with interventional radiology today.  Discussed with IR Dr. Kathlene Cote.  2 L grossly bloody fluid removed.  Pulmonology also following and aware.  Fluid will be sent for malignancy work-up including cytology, flow cytometry.   Assessment & Plan:   Active Problems:   Pleural effusion  Right-sided pleural effusion Per the patient's son he has had a history of recurrent pleural effusions requiring thoracentesis  x4 Etiology is yet unclear Previously was thought to be due to heart failure Was previously recommended to see thoracic surgery as outpatient Previous thoracentesis revealed lymphocytic predominant effusion Pulmonology on consult, recommendations appreciated 3/29: US guided thoracentesis, 2 L fluid removed, grossly bloody Plan: Place in PCU Hold chemoprophylaxis considering bloody effusion Hold home aspirin Supplemental oxygen, wean as tolerated Check post tap chest x-ray rule out PTX Fluid studies including cytology and flow cytometry Appreciate pulmonology follow-up  Chronic systolic congestive heart failure Questionable whether there is true exacerbation No lower extremity edema on exam Majority of fluid on chest x-ray seen is pleural effusion Apparently patient's not responded to Lasix in the past Plan: Continue Lasix per home dose Continue metoprolol per home dose Daily weights Strict I's and O's  Chronic atrial fibrillation Patient does not appear to be on anticoagulation, presumably due to fall risk Continue amiodarone -Nature of bloody effusion is unclear, malignancy brought into differential  Acute on chronic hypoxic respiratory failure Patient on baseline 3 L Patient tachypneic and oxygen demand increased to 6 L overnight Status post thoracentesis 3/29 Continue oxygen wean as tolerated Incentive spirometry use  Alzheimer's dementia Continue home Aricept and Namenda  Insomnia Home Remeron  Coronary artery disease Hyperlipidemia Continue home statin Aspirin on hold considering bloody effusion  Parkinson's disease Continue home Sinemet   DVT prophylaxis: SCD Code Status: DNR Family Communication: Patient's son Eric Hendricks [phone] on 3/29 Disposition Plan: Status is: Inpatient  Remains inpatient appropriate because:Inpatient level of care appropriate due to severity of illness   Dispo: The patient is from: Home              Anticipated  d/c is to: Unclear  at this time              Patient currently is not medically stable to d/c.   Difficult to place patient No  2 L removed on ultrasound-guided thoracentesis today.  Grossly bloody effusion.  Etiology unclear.  Fluid studies ordered and in progress.  Disposition plan pending.     Level of care: Stepdown  Consultants:   Pulmonology  Interventional radiology  Procedures:   Ultrasound-guided thoracentesis 3/29  Antimicrobials:   None   Subjective: Patient seen and examined.  Lethargic but does respond to voice and follows simple commands  Objective: Vitals:   12/16/20 1200 12/16/20 1300 12/16/20 1400 12/16/20 1500  BP: 111/74 103/63 106/63 93/72  Pulse: 79 (!) 106 71   Resp: (!) 22 (!) 49 (!) 30 (!) 21  Temp: 98.2 F (36.8 C)     TempSrc: Oral     SpO2: 100% (!) 88% 95%   Weight:      Height:        Intake/Output Summary (Last 24 hours) at 12/16/2020 1540 Last data filed at 12/16/2020 0200 Gross per 24 hour  Intake --  Output 900 ml  Net -900 ml   Filed Weights   12/15/20 1325 12/16/20 0500  Weight: 63.5 kg 63.5 kg    Examination:  General exam: Appears calm and comfortable  Respiratory system: Breath sounds decreased on right.  Increased work of breathing.  6 L Cardiovascular system: Tachycardic, regular rhythm, no murmurs. Gastrointestinal system: Abdomen is nondistended, soft and nontender. No organomegaly or masses felt. Normal bowel sounds heard. Central nervous system: Alert and oriented x1.  No focal deficits Extremities: Symmetric 5 x 5 power.  Diffuse muscle wasting bilaterally Skin: Pale and thin.  No obvious rashes or lesions Psychiatry: Judgement and insight appear impaired. Mood & affect flattened.     Data Reviewed: I have personally reviewed following labs and imaging studies  CBC: Recent Labs  Lab 12/15/20 1330 12/16/20 0449  WBC 6.2 5.1  NEUTROABS 5.1  --   HGB 12.2* 12.6*  HCT 38.8* 39.9  MCV 98.0 99.3  PLT  224 494   Basic Metabolic Panel: Recent Labs  Lab 12/15/20 1330 12/15/20 2043 12/16/20 0449  NA 139  --  142  K 4.4  --  4.8  CL 101  --  101  CO2 28  --  33*  GLUCOSE 114*  --  104*  BUN 35*  --  30*  CREATININE 1.38*  --  1.29*  CALCIUM 8.5*  --  8.7*  MG  --  2.5*  --    GFR: Estimated Creatinine Clearance: 31.4 mL/min (A) (by C-G formula based on SCr of 1.29 mg/dL (H)). Liver Function Tests: No results for input(s): AST, ALT, ALKPHOS, BILITOT, PROT, ALBUMIN in the last 168 hours. No results for input(s): LIPASE, AMYLASE in the last 168 hours. No results for input(s): AMMONIA in the last 168 hours. Coagulation Profile: No results for input(s): INR, PROTIME in the last 168 hours. Cardiac Enzymes: No results for input(s): CKTOTAL, CKMB, CKMBINDEX, TROPONINI in the last 168 hours. BNP (last 3 results) No results for input(s): PROBNP in the last 8760 hours. HbA1C: No results for input(s): HGBA1C in the last 72 hours. CBG: No results for input(s): GLUCAP in the last 168 hours. Lipid Profile: Recent Labs    12/16/20 1334  TRIG 78   Thyroid Function Tests: Recent Labs    12/15/20 2043 12/16/20 0449  TSH 9.575*  --  FREET4  --  0.78   Anemia Panel: No results for input(s): VITAMINB12, FOLATE, FERRITIN, TIBC, IRON, RETICCTPCT in the last 72 hours. Sepsis Labs: Recent Labs  Lab 12/15/20 1419 12/15/20 2043 12/15/20 2308  PROCALCITON <0.10  --   --   LATICACIDVEN  --  1.0 1.5    Recent Results (from the past 240 hour(s))  SARS CORONAVIRUS 2 (TAT 6-24 HRS) Nasopharyngeal Nasopharyngeal Swab     Status: None   Collection Time: 12/15/20  1:50 PM   Specimen: Nasopharyngeal Swab  Result Value Ref Range Status   SARS Coronavirus 2 NEGATIVE NEGATIVE Final    Comment: (NOTE) SARS-CoV-2 target nucleic acids are NOT DETECTED.  The SARS-CoV-2 RNA is generally detectable in upper and lower respiratory specimens during the acute phase of infection. Negative results  do not preclude SARS-CoV-2 infection, do not rule out co-infections with other pathogens, and should not be used as the sole basis for treatment or other patient management decisions. Negative results must be combined with clinical observations, patient history, and epidemiological information. The expected result is Negative.  Fact Sheet for Patients: SugarRoll.be  Fact Sheet for Healthcare Providers: https://www.woods-mathews.com/  This test is not yet approved or cleared by the [place] FDA and  has been authorized for detection and/or diagnosis of SARS-CoV-2 by FDA under an Emergency Use Authorization (EUA). This EUA will remain  in effect (meaning this test can be used) for the duration of the COVID-19 declaration under Se ction 564(b)(1) of the Act, 21 U.S.C. section 360bbb-3(b)(1), unless the authorization is terminated or revoked sooner.  Performed at [facility], [place] [address], [place], [place] [address]   MRSA PCR Screening     Status: None   Collection Time: 12/15/20  8:00 PM   Specimen: Nasopharyngeal  Result Value Ref Range Status   MRSA by PCR NEGATIVE NEGATIVE Final    Comment:        The GeneXpert MRSA Assay (FDA approved for NASAL specimens only), is one component of a comprehensive MRSA colonization surveillance program. It is not intended to diagnose MRSA infection nor to guide or monitor treatment for MRSA infections. Performed at [facility], [address], [place],  [address]          Radiology Studies: DG Chest 2 View  Result Date: 12/15/2020 CLINICAL DATA:  Shortness of breath. EXAM: CHEST - 2 VIEW COMPARISON:  November 04, 2020. FINDINGS: Stable cardiomediastinal silhouette. Left-sided pacemaker is unchanged in position. No pneumothorax is noted. Interval development of moderate right pleural effusion with associated atelectasis or pneumonia. Bony thorax is unremarkable.  IMPRESSION: Interval development of moderate right pleural effusion with associated atelectasis or pneumonia. Aortic Atherosclerosis (ICD10-I70.0). Electronically Signed   By: Marijo Conception M.D.   On: 12/15/2020 13:57   CT ANGIO CHEST PE W OR WO CONTRAST  Result Date: 12/15/2020 CLINICAL DATA:  Elevated D-dimer, dyspnea EXAM: CT ANGIOGRAPHY CHEST WITH CONTRAST TECHNIQUE: Multidetector CT imaging of the chest was performed using the standard protocol during bolus administration of intravenous contrast. Multiplanar CT image reconstructions and MIPs were obtained to evaluate the vascular anatomy. CONTRAST:  71mL OMNIPAQUE IOHEXOL 350 MG/ML SOLN COMPARISON:  12/13/2018 FINDINGS: Cardiovascular: There is adequate opacification of the pulmonary arterial tree. There is no intraluminal filling defect identified to suggest acute pulmonary embolism. There is relative hypoenhancement of the right pulmonary arterial tree, likely related to vascular shunting secondary to subtotal collapse of the right lung. The central pulmonary arteries are of  normal caliber. Moderate multi-vessel coronary artery calcification with possible stenting of the left anterior descending coronary artery. Global cardiac size is within normal limits. Left subclavian pacemaker leads are seen within the right atrium and right ventricle toward the apex. No pericardial effusion. Moderate atherosclerotic calcification within the thoracic aorta. No aortic aneurysm. Mediastinum/Nodes: No pathologic thoracic adenopathy. Visualized thyroid is unremarkable. Esophagus is unremarkable. Lungs/Pleura: There is near complete collapse of the right lung secondary to compressive atelectasis of a large right pleural effusion. The right middle and lower lobes are completely collapsed. There is partial aeration of the right upper lobe. No central obstructing mass is identified. The costophrenic angle is excluded from view on this examination. Small left pleural  effusion is present with mild compressive atelectasis of the left lower lobe. No superimposed focal pulmonary infiltrate. No pneumothorax. Upper Abdomen: No acute abnormality within the visualized upper abdomen. Musculoskeletal: No acute bone abnormality. No lytic or blastic bone lesion identified. Review of the MIP images confirms the above findings. IMPRESSION: No pulmonary embolism. Large right pleural effusion with subtotal collapse of the right lung. Small left pleural effusion also identified. Moderate coronary artery calcification. Aortic Atherosclerosis (ICD10-I70.0). Electronically Signed   By: Fidela Salisbury MD   On: 12/15/2020 23:25   DG Chest Port 1 View  Result Date: 12/16/2020 CLINICAL DATA:  Pleural effusion and shortness of breath. EXAM: PORTABLE CHEST 1 VIEW COMPARISON:  12/15/2020 FINDINGS: Left chest wall pacer device is noted with leads in the right atrial appendage and right ventricle. Heart size is normal. Further increase in volume of right pleural effusion with near complete opacification of the right hemithorax. Increase interstitial markings in the left lung compatible with interstitial edema. IMPRESSION: Increase in volume of right pleural effusion with near complete opacification of the right hemithorax. Electronically Signed   By: Kerby Moors M.D.   On: 12/16/2020 12:52   US THORACENTESIS ASP PLEURAL SPACE W/IMG GUIDE  Result Date: 12/16/2020 CLINICAL DATA:  Recurrent right pleural effusion and status post prior thoracentesis procedures. The most recent is dated 11/04/2020. EXAM: ULTRASOUND GUIDED RIGHT THORACENTESIS COMPARISON:  None. PROCEDURE: An ultrasound guided thoracentesis was thoroughly discussed with the patient and questions answered. The benefits, risks, alternatives and complications were also discussed. The patient understands and wishes to proceed with the procedure. Written consent was obtained. Ultrasound was performed to localize and mark an adequate pocket  of fluid in the right chest. The area was then prepped and draped in the normal sterile fashion. 1% Lidocaine was used for local anesthesia. Under ultrasound guidance a 6 French Safe-T-Centesis catheter was introduced. Thoracentesis was performed. The catheter was removed and a dressing applied. COMPLICATIONS: None FINDINGS: A total of approximately 2 L of grossly bloody fluid was removed. Some fluid remain present but the procedure had to be halted due to development of significant right chest pain during evacuation. A fluid sample was sent for laboratory analysis. IMPRESSION: Successful ultrasound guided right thoracentesis yielding 2 L of bloody pleural fluid. Electronically Signed   By: Aletta Edouard M.D.   On: 12/16/2020 15:33        Scheduled Meds: . amiodarone  100 mg Oral Q0600  . aspirin EC  81 mg Oral Daily  . atorvastatin  80 mg Oral QPM  . carbidopa-levodopa  1 tablet Oral TID  . Chlorhexidine Gluconate Cloth  6 each Topical Daily  . divalproex  250 mg Oral TID  . docusate sodium  100 mg Oral BID  . donepezil  10  mg Oral QHS  . famotidine  20 mg Oral BID  . furosemide  40 mg Oral BID  . mouth rinse  15 mL Mouth Rinse BID  . memantine  10 mg Oral BID  . metoprolol succinate  25 mg Oral Daily  . mirtazapine  15 mg Oral QHS  . multivitamin-lutein  1 capsule Oral Daily   Continuous Infusions:   LOS: 0 days    Time spent: 25-minute    Sidney Ace, MD [facility] Pager [phone]  If 7PM-7AM, please contact night-coverage 12/16/2020, 3:40 PM

## 2020-12-16 NOTE — Progress Notes (Addendum)
Rapid resoponse called by ICU RN concern for worsening respiratory status and was transferred to stepdown   Bedside patient says his shortness of breath has improved since admission to hospital vbg 7.41,54, <31,34.2 - patient put on high flow DDimer was elevated - CTA done secondary to history IMPRESSION: No pulmonary embolism.  Large right pleural effusion with subtotal collapse of the right lung. Small left pleural effusion also identified.  Moderate coronary artery calcification.  Aortic Atherosclerosis (ICD10-I70.0).  Breathing/ vitals stable - monitor  Has remained stable throughout the night. Thoracentesis planned for today - patient can transfer to progressive bed

## 2020-12-16 NOTE — Progress Notes (Signed)
   Referral received for Eric Hendricks :goals of care discussion. Chart reviewed and updates received from RN. Patient assessed.  I was able to speak with patient's son, Eric Hendricks who shared recent surgery and complications. He acknowledges he is patient's HCPOA however in the setting of his health he would like to defer discussions to his sister, Eric Hendricks . Spoke with sister and introduced Palliative's role. [place] [facility] meeting scheduled for 12/17/20 @ 10am as requested. Family is aware we will meet at patient's bedside.   Thank you for your referral and allowing [facility] to assist in Mr. Eric Hendricks's care.   Alda Lea, AGPCNP-BC Palliative Medicine Team  Phone: [phone] Pager: [phone] Amion: N. Cous[place]   NO CHARGE

## 2020-12-17 DIAGNOSIS — Z7189 Other specified counseling: Secondary | ICD-10-CM

## 2020-12-17 DIAGNOSIS — Z9889 Other specified postprocedural states: Secondary | ICD-10-CM

## 2020-12-17 DIAGNOSIS — Z515 Encounter for palliative care: Secondary | ICD-10-CM

## 2020-12-17 DIAGNOSIS — Z66 Do not resuscitate: Secondary | ICD-10-CM

## 2020-12-17 NOTE — Progress Notes (Signed)
   12/15/20 1913 12/15/20 1927  Assess: MEWS Score  Temp 98.4 F (36.9 C) 98.3 F (36.8 C)  BP 137/67 111/62  Pulse Rate (!) 124 96  Resp (!) 30 (!) 42  Level of Consciousness Alert  --   SpO2 97 % 98 %  O2 Device Nasal Cannula Nasal Cannula  O2 Flow Rate (L/min) 3 L/min 3 L/min  Assess: MEWS Score  MEWS Temp 0 0  MEWS Systolic 0 0  MEWS Pulse 2 0  MEWS RR 2 3  MEWS LOC 0 0  MEWS Score 4 3  MEWS Score Color Red Yellow  Assess: if the MEWS score is Yellow or Red  Were vital signs taken at a resting state? Yes Yes  Focused Assessment No change from prior assessment (Admission vitals) Change from prior assessment (see assessment flowsheet)  Early Detection of Sepsis Score *See Row Information* Low Low  MEWS guidelines implemented *See Row Information* Yes Yes  Treat  MEWS Interventions  --  Escalated (See documentation below)  Pain Scale 0-10 0-10  Pain Score 0 0  Take Vital Signs  Increase Vital Sign Frequency  Red: Q 1hr X 4 then Q 4hr X 4, if remains red, continue Q 4hrs Yellow: Q 2hr X 2 then Q 4hr X 2, if remains yellow, continue Q 4hrs  Escalate  MEWS: Escalate Red: discuss with charge nurse/RN and provider, consider discussing with RRT Yellow: discuss with charge nurse/RN and consider discussing with provider and RRT (Rapid response RN at the bedside [place], RN)  Notify: Charge Nurse/RN  Name of Charge Nurse/RN Notified AnnaBelle, RN  --   Date Charge Nurse/RN Notified 12/15/20  --   Time Charge Nurse/RN Notified 1915  --   Notify: Provider  Provider Name/Title Sharion Settler, FNP  --   Date Provider Notified 12/15/20  --   Time Provider Notified 1933  --   Notification Type Page  --   Notification Reason Change in status;Critical result  --   Provider response See new orders  --   Date of Provider Response 12/15/20  --   Time of Provider Response 1934  --   Inserted for Ernestene Mention RN

## 2020-12-17 NOTE — Progress Notes (Signed)
PROGRESS NOTE    Eric Hendricks  INO:[id number] DOB: 02/13/1924 DOA: 12/15/2020 PCP: [name], MD  Brief Narrative:  87 y.o. male with medical history significant of coronary artery disease, chronic systolic congestive heart failure, chronic hypoxic respiratory failure on 3 L paroxysmal atrial fibrillation on anticoagulation, Parkinson disease on Sinemet who presents for evaluation of shortness of breath worsening over 3 days.  Patient apparently lives at [facility] [place] and is supposed to be wearing his 3 L oxygen all time.  His adherence is somewhat unclear.  The patient is a poor historian so majority of history is gained by speaking with family members over phone, speak with the ER physician and reading the medical record.  Per the patient's son the patient has a history of recurrent right-sided pleural effusion and per his son has required for thoracentesis over the past few months.  Etiology of the recurrent pleural effusions is as yet unknown and apparently the patient has been recommended to see thoracic surgery as an outpatient.  On arrival the patient is hemodynamically stable however chest x-ray demonstrating large right pleural effusion.  The patient is tachypneic but mentating relatively clearly and is afebrile.    3/29: Overnight the patient was found to be increasingly tachypneic with increasing oxygen requirement was transferred to stepdown unit.  Transfer orders placed back to PCU early this morning.  Patient underwent image guided right-sided thoracentesis with interventional radiology today.  Discussed with IR Dr. Kathlene Cote.  2 L grossly bloody fluid removed.  Pulmonology also following and aware.  Fluid will be sent for malignancy work-up including cytology, flow cytometry.  3/30: Palliative care meeting with family.  DNR now.   Assessment & Plan:   Active Problems:   Pleural effusion  Right-sided pleural effusion/recurrent Per the patient's son he has had a history  of recurrent pleural effusions requiring thoracentesis x4 Thought to be likely from cardiac and renal failure as previous fluid studies consistent with exudative nature with lymphocyte predominance Was previously recommended to see thoracic surgery as outpatient Previous thoracentesis revealed lymphocytic predominant effusion Pulmonology following, recommendations appreciated 3/29: US guided thoracentesis, 2 L fluid removed, grossly bloody Plan: Hold chemoprophylaxis considering bloody effusion Hold home aspirin Supplemental oxygen, wean as tolerated -Cytology is negative so unlikely malignant but await repeat thoracentesis fluid studies -May benefit from tunneled pleural catheter/pleurodesis for palliation due to recurrent nature   Chronic systolic congestive heart failure Questionable whether there is true exacerbation No lower extremity edema on exam Majority of fluid on chest x-ray seen is pleural effusion Apparently patient's not responded to Lasix in the past Plan: Continue Lasix per home dose Continue metoprolol per home dose Daily weights Net IO Since Admission: -770 mL [12/17/20 1645] Strict I's and O's  Chronic atrial fibrillation Patient does not appear to be on anticoagulation, presumably due to fall risk Continue amiodarone  Acute on chronic hypoxic respiratory failure Patient on baseline 3 L He is on 6 L now Status post thoracentesis 3/29 Continue oxygen wean as tolerated Incentive spirometry use  Alzheimer's dementia Continue home Aricept and Namenda  Insomnia Home Remeron  Coronary artery disease Hyperlipidemia Continue home statin Aspirin on hold considering bloody effusion  Parkinson's disease Continue home Sinemet  PT/OT for dispo planning   DVT prophylaxis: SCD Code Status: DNR Family Communication: Patient's son Eric Hendricks [phone] on 3/29 Disposition Plan: Status is: Inpatient  Remains inpatient appropriate because:Inpatient  level of care appropriate due to severity of illness   Dispo: The patient is from:  Home              Anticipated d/c is to: SNF              Patient currently is not medically stable to d/c.   Difficult to place patient No     Level of care: Progressive Cardiac  Consultants:   Pulmonology  Interventional radiology  Procedures:   Ultrasound-guided thoracentesis 3/29  Antimicrobials:   None   Subjective: Having difficulty hearing but denies any complaint when I talked to him from near to his left ear.  He feels like having bowel movement today  Objective: Vitals:   12/17/20 0813 12/17/20 0934 12/17/20 1155 12/17/20 1430  BP:  (!) 122/106 96/73 100/66  Pulse:  (!) 51 95 85  Resp:   19 19  Temp: 97.9 F (36.6 C)  97.6 F (36.4 C) 97.8 F (36.6 C)  TempSrc: Oral  Oral Oral  SpO2:   93% 97%  Weight:      Height:        Intake/Output Summary (Last 24 hours) at 12/17/2020 1641 Last data filed at 12/17/2020 0858 Gross per 24 hour  Intake 240 ml  Output 350 ml  Net -110 ml   Filed Weights   12/15/20 1325 12/16/20 0500 12/17/20 0500  Weight: 63.5 kg 63.5 kg 63.5 kg    Examination:  General exam: Appears calm and comfortable, difficulty hearing Respiratory system: Breath sounds decreased on right.  Increased work of breathing.  6 L Cardiovascular system: Tachycardic, regular rhythm, no murmurs. Gastrointestinal system: Abdomen is nondistended, soft and nontender. No organomegaly or masses felt. Normal bowel sounds heard. Central nervous system: Alert and oriented x1.  No focal deficits Extremities: Symmetric 5 x 5 power.  Diffuse muscle wasting bilaterally Skin: Pale and thin.  No obvious rashes or lesions Psychiatry: Judgement and insight appear impaired. Mood & affect flattened.     Data Reviewed: I have personally reviewed following labs and imaging studies  CBC: Recent Labs  Lab 12/15/20 1330 12/16/20 0449  WBC 6.2 5.1  NEUTROABS 5.1  --   HGB  12.2* 12.6*  HCT 38.8* 39.9  MCV 98.0 99.3  PLT 224 976   Basic Metabolic Panel: Recent Labs  Lab 12/15/20 1330 12/15/20 2043 12/16/20 0449  NA 139  --  142  K 4.4  --  4.8  CL 101  --  101  CO2 28  --  33*  GLUCOSE 114*  --  104*  BUN 35*  --  30*  CREATININE 1.38*  --  1.29*  CALCIUM 8.5*  --  8.7*  MG  --  2.5*  --    GFR: Estimated Creatinine Clearance: 31.4 mL/min (A) (by C-G formula based on SCr of 1.29 mg/dL (H)). Liver Function Tests: No results for input(s): AST, ALT, ALKPHOS, BILITOT, PROT, ALBUMIN in the last 168 hours. No results for input(s): LIPASE, AMYLASE in the last 168 hours. No results for input(s): AMMONIA in the last 168 hours. Coagulation Profile: No results for input(s): INR, PROTIME in the last 168 hours. Cardiac Enzymes: No results for input(s): CKTOTAL, CKMB, CKMBINDEX, TROPONINI in the last 168 hours. BNP (last 3 results) No results for input(s): PROBNP in the last 8760 hours. HbA1C: No results for input(s): HGBA1C in the last 72 hours. CBG: No results for input(s): GLUCAP in the last 168 hours. Lipid Profile: Recent Labs    12/16/20 1334  TRIG 78   Thyroid Function Tests: Recent Labs  12/15/20 2043 12/16/20 0449  TSH 9.575*  --   FREET4  --  0.78   Anemia Panel: No results for input(s): VITAMINB12, FOLATE, FERRITIN, TIBC, IRON, RETICCTPCT in the last 72 hours. Sepsis Labs: Recent Labs  Lab 12/15/20 1419 12/15/20 2043 12/15/20 2308  PROCALCITON <0.10  --   --   LATICACIDVEN  --  1.0 1.5    Recent Results (from the past 240 hour(s))  SARS CORONAVIRUS 2 (TAT 6-24 HRS) Nasopharyngeal Nasopharyngeal Swab     Status: None   Collection Time: 12/15/20  1:50 PM   Specimen: Nasopharyngeal Swab  Result Value Ref Range Status   SARS Coronavirus 2 NEGATIVE NEGATIVE Final    Comment: (NOTE) SARS-CoV-2 target nucleic acids are NOT DETECTED.  The SARS-CoV-2 RNA is generally detectable in upper and lower respiratory specimens  during the acute phase of infection. Negative results do not preclude SARS-CoV-2 infection, do not rule out co-infections with other pathogens, and should not be used as the sole basis for treatment or other patient management decisions. Negative results must be combined with clinical observations, patient history, and epidemiological information. The expected result is Negative.  Fact Sheet for Patients: SugarRoll.be  Fact Sheet for Healthcare Providers: https://www.woods-mathews.com/  This test is not yet approved or cleared by the [place] FDA and  has been authorized for detection and/or diagnosis of SARS-CoV-2 by FDA under an Emergency Use Authorization (EUA). This EUA will remain  in effect (meaning this test can be used) for the duration of the COVID-19 declaration under Se ction 564(b)(1) of the Act, 21 U.S.C. section 360bbb-3(b)(1), unless the authorization is terminated or revoked sooner.  Performed at [facility], [place] [address] [place], [place] [address]   MRSA PCR Screening     Status: None   Collection Time: 12/15/20  8:00 PM   Specimen: Nasopharyngeal  Result Value Ref Range Status   MRSA by PCR NEGATIVE NEGATIVE Final    Comment:        The GeneXpert MRSA Assay (FDA approved for NASAL specimens only), is one component of a comprehensive MRSA colonization surveillance program. It is not intended to diagnose MRSA infection nor to guide or monitor treatment for MRSA infections. Performed at [facility], [address] [place], [place] [address]   Body fluid culture w Gram Stain     Status: None (Preliminary result)   Collection Time: 12/16/20  3:41 PM   Specimen: PATH Cytology Pleural fluid  Result Value Ref Range Status   Specimen Description   Final    PLEURAL Performed at [facility], [address], [place], [place] [address]    Special Requests   Final    NONE Performed at  [facility], [address] [place],  [address]    Gram Stain   Final    FEW WBC PRESENT,BOTH PMN AND MONONUCLEAR NO ORGANISMS SEEN Performed at [facility], [place] [address] [place],  [address]    Culture PENDING  Incomplete   Report Status PENDING  Incomplete         Radiology Studies: CT ANGIO CHEST PE W OR WO CONTRAST  Result Date: 12/15/2020 CLINICAL DATA:  Elevated D-dimer, dyspnea EXAM: CT ANGIOGRAPHY CHEST WITH CONTRAST TECHNIQUE: Multidetector CT imaging of the chest was performed using the standard protocol during bolus administration of intravenous contrast. Multiplanar CT image reconstructions and MIPs were obtained to evaluate the vascular anatomy. CONTRAST:  77mL OMNIPAQUE IOHEXOL 350 MG/ML SOLN COMPARISON:  12/13/2018 FINDINGS: Cardiovascular: There is adequate opacification of the pulmonary arterial tree. There is no intraluminal filling defect identified to suggest acute pulmonary embolism. There is relative hypoenhancement of the right pulmonary arterial tree, likely related to vascular shunting secondary to subtotal collapse of the right lung. The central pulmonary arteries are of normal caliber. Moderate multi-vessel coronary artery calcification with possible stenting of the left anterior descending coronary artery. Global cardiac size is within normal limits. Left subclavian pacemaker leads are seen within the right atrium and right ventricle toward the apex. No pericardial effusion. Moderate atherosclerotic calcification within the thoracic aorta. No aortic aneurysm. Mediastinum/Nodes: No pathologic thoracic adenopathy. Visualized thyroid is unremarkable. Esophagus is unremarkable. Lungs/Pleura: There is near complete collapse of the right lung secondary to compressive atelectasis of a large right pleural effusion. The right middle and lower lobes are completely collapsed. There is partial aeration of the right upper lobe. No central  obstructing mass is identified. The costophrenic angle is excluded from view on this examination. Small left pleural effusion is present with mild compressive atelectasis of the left lower lobe. No superimposed focal pulmonary infiltrate. No pneumothorax. Upper Abdomen: No acute abnormality within the visualized upper abdomen. Musculoskeletal: No acute bone abnormality. No lytic or blastic bone lesion identified. Review of the MIP images confirms the above findings. IMPRESSION: No pulmonary embolism. Large right pleural effusion with subtotal collapse of the right lung. Small left pleural effusion also identified. Moderate coronary artery calcification. Aortic Atherosclerosis (ICD10-I70.0). Electronically Signed   By: Fidela Salisbury MD   On: 12/15/2020 23:25   DG Chest Port 1 View  Result Date: 12/16/2020 CLINICAL DATA:  Pleural effusion status post right thoracentesis EXAM: PORTABLE CHEST 1 VIEW COMPARISON:  12/16/2020 FINDINGS: Single frontal view of the chest demonstrates decreased right pleural effusion after thoracentesis, with no evidence of pneumothorax. Moderate residual loculated right pleural effusion with continued areas of consolidation at the right lung base. Left chest is clear. Cardiac silhouette is stable. Dual lead pacemaker unchanged. IMPRESSION: 1. Moderate residual loculated right pleural effusion after thoracentesis. No evidence of pneumothorax. Electronically Signed   By: Randa Ngo M.D.   On: 12/16/2020 15:45   DG Chest Port 1 View  Result Date: 12/16/2020 CLINICAL DATA:  Pleural effusion and shortness of breath. EXAM: PORTABLE CHEST 1 VIEW COMPARISON:  12/15/2020 FINDINGS: Left chest wall pacer device is noted with leads in the right atrial appendage and right ventricle. Heart size is normal. Further increase in volume of right pleural effusion with near complete opacification of the right hemithorax. Increase interstitial markings in the left lung compatible with interstitial  edema. IMPRESSION: Increase in volume of right pleural effusion with near complete opacification of the right hemithorax. Electronically Signed   By: Kerby Moors M.D.   On: 12/16/2020 12:52   US THORACENTESIS ASP PLEURAL SPACE W/IMG GUIDE  Result Date: 12/16/2020 CLINICAL DATA:  Recurrent right pleural effusion and status post prior thoracentesis procedures. The most recent is dated 11/04/2020. EXAM: ULTRASOUND GUIDED RIGHT THORACENTESIS COMPARISON:  None. PROCEDURE: An ultrasound guided thoracentesis was thoroughly discussed with the patient and questions answered. The benefits, risks, alternatives and complications were also discussed. The patient understands and wishes to proceed with the procedure. Written consent was obtained. Ultrasound was performed to localize and mark an adequate pocket of fluid in the right chest. The area was then prepped and draped in the normal sterile fashion. 1% Lidocaine was used for local anesthesia. Under ultrasound guidance a 6 French Safe-T-Centesis catheter was  introduced. Thoracentesis was performed. The catheter was removed and a dressing applied. COMPLICATIONS: None FINDINGS: A total of approximately 2 L of grossly bloody fluid was removed. Some fluid remain present but the procedure had to be halted due to development of significant right chest pain during evacuation. A fluid sample was sent for laboratory analysis. IMPRESSION: Successful ultrasound guided right thoracentesis yielding 2 L of bloody pleural fluid. Electronically Signed   By: Aletta Edouard M.D.   On: 12/16/2020 15:33        Scheduled Meds: . amiodarone  100 mg Oral Q0600  . atorvastatin  80 mg Oral QPM  . carbidopa-levodopa  1 tablet Oral TID  . Chlorhexidine Gluconate Cloth  6 each Topical Daily  . divalproex  250 mg Oral TID  . docusate sodium  100 mg Oral BID  . donepezil  10 mg Oral QHS  . famotidine  20 mg Oral BID  . furosemide  40 mg Oral BID  . mouth rinse  15 mL Mouth Rinse  BID  . memantine  10 mg Oral BID  . metoprolol succinate  25 mg Oral Daily  . mirtazapine  15 mg Oral QHS  . multivitamin-lutein  1 capsule Oral Daily   Continuous Infusions:   LOS: 1 day    Time spent: 25-minute    Max Sane, MD [facility] Pager [phone]  If 7PM-7AM, please contact night-coverage 12/17/2020, 4:41 PM

## 2020-12-17 NOTE — Consult Note (Addendum)
Consultation Note Date: 12/17/2020   Patient Name: Eric Hendricks  DOB: 1924/03/09  MRN: [id number]  Age / Sex: 87 y.o., male   PCP: [name], MD Referring Physician: Max Sane, MD   REASON FOR CONSULTATION:Establishing goals of care  Palliative Care consult requested for goals of care discussion in this 87 y.o. male with multiple medical problems including hypoxic aspiratory failure (3L), atrial fibrillation, systolic congestive heart failure, coronary artery disease, Parkinson's disease (Sinemet), depression, and dementia.  Patient presented from [facility] with concerns of shortness of breath x3 days.  Chest x-ray showed large right pleural effusion.  Patient is s/p thoracentesis yielding 2 L of bloody fluid.  Clinical Assessment and Goals of Care: I have reviewed medical records including lab results, imaging, Epic notes, and MAR, received report from the bedside RN, and assessed the patient.   I met at the bedside with Mr. Eric Hendricks and his daughter, Eric Hendricks to discuss diagnosis prognosis, GOC, EOL wishes, disposition and options.  Patient most recently returned back to bed from using the bedside commode.  He is short of breath due to exertion and somewhat fatigued.  Denies pain.  Awake alert and oriented x3. Hard of hearing.   I introduced Palliative Medicine as specialized medical care for people living with serious illness. It focuses on providing relief from the symptoms and stress of a serious illness. The goal is to improve quality of life for both the patient and the family.   We discussed a brief life review of the patient, along with his functional and nutritional status.  Patient has been a resident of [facility] for the past year.  Prior to placement he was in a independent living for many years.  He has 3 children.  He served in the [employer].  Patient reports he enjoys watching sports with his favorite teams being the [employer] and [employer].   Prior to  admission patient would mainly spend most of his time in his room.  He did require some assistance with ADLs.  Daughter reports patient appetite was fair and he would generally eat foods of soft consistency as he has not worn his dentures in years.  We discussed His current illness and what it means in the larger context of His on-going co-morbidities. Natural disease trajectory and expectations at EOL were discussed.   Daughter verbalized understanding of patient's condition expressing goals are to continue with treating the treatable.  Family is not interested in aggressive interventions.  Discussed at length patient's continued pleural effusions requiring thoracentesis most recently during hospitalization and prior to that in December 2021 and February 2022 with each occurrence yielding over 2 L.  A detailed discussion was had today regarding advanced directives.  Concepts specific to code status, artifical feeding and hydration, continued IV antibiotics and rehospitalization. The difference between a aggressive medical intervention and a palliative comfort care path were discussed at length.   Values and goals of care important to patient and family were attempted to be elicited.   Patient and family are clear with expressed goals of continued treatment however with no escalation of care.  He is not interested in any forms of artificial feedings/dialysis/surgical interventions.  Family remains hopeful patient can discharge back to facility, with understanding patient's condition may continue to further decline and approach end-of-life.   Hospice and Palliative Care services outpatient were explained and offered. Patient and family verbalized their understanding and awareness of both palliative and hospice's goals  and philosophy of care.  Family would like to continue with discussions in regards to outpatient hospice versus palliative.  I discussed the importance of continued conversation with  family and their medical providers regarding overall plan of care and treatment options, ensuring decisions are within the context of the patients values and GOCs.  Questions and concerns were addressed. The family was encouraged to call with questions or concerns.  PMT will continue to support holistically as needed.   I completed a MOST form today. The patient and family outlined their wishes for the following treatment decisions:  Cardiopulmonary Resuscitation: Do Not Attempt Resuscitation (DNR/No CPR)  Medical Interventions: Limited Additional Interventions: Use medical treatment, IV fluids and cardiac monitoring as indicated, DO NOT USE intubation or mechanical ventilation. May consider use of less invasive airway support such as BiPAP or CPAP. Also provide comfort measures. Transfer to the hospital if indicated. Avoid intensive care.   Antibiotics: Antibiotics if indicated  IV Fluids: IV fluids if indicated  Feeding Tube: No feeding tube     CODE STATUS: DNR  ADVANCE DIRECTIVES: Primary Decision Maker: Eric Hendricks (however he has deferred conversations and decisions to his sister Eric Hendricks in his absence due to his own medical needs).   SYMPTOM MANAGEMENT: per attending   Palliative Prophylaxis:   Delirium Protocol and Frequent Pain Assessment  PSYCHO-SOCIAL/SPIRITUAL:  Support System: Family  Desire for further Chaplaincy support:NO   Additional Recommendations (Limitations, Scope, Preferences):  No Artificial Feeding, No Hemodialysis, No Surgical Procedures and Treat the treatable, no escalation of care  Education on hospice/palliative    PAST MEDICAL HISTORY: Past Medical History:  Diagnosis Date  . Arrhythmia    atrial fibrillation  . B12 deficiency   . CHF (congestive heart failure) ([place])   . Dementia ([place])   . Depression   . Hyperlipemia   . Parkinson's disease ([place])   . Prostate cancer ([place])     ALLERGIES:  is allergic to phenylephrine-guaifenesin,  aspirin, guaifenesin, guanfacine, phenylephrine, and suprax [cefixime].   MEDICATIONS:  Current Facility-Administered Medications  Medication Dose Route Frequency Provider Last Rate Last Admin  . acetaminophen (TYLENOL) tablet 650 mg  650 mg Oral Q6H PRN Ralene Muskrat B, MD       Or  . acetaminophen (TYLENOL) suppository 650 mg  650 mg Rectal Q6H PRN Sreenath, Sudheer B, MD      . albuterol (PROVENTIL) (2.5 MG/3ML) 0.083% nebulizer solution 2.5 mg  2.5 mg Nebulization Q2H PRN Sreenath, Sudheer B, MD      . amiodarone (PACERONE) tablet 100 mg  100 mg Oral Q0600 Sreenath, Sudheer B, MD   100 mg at 12/17/20 0810  . atorvastatin (LIPITOR) tablet 80 mg  80 mg Oral QPM Ralene Muskrat B, MD   80 mg at 12/16/20 1613  . carbidopa-levodopa (SINEMET IR) 25-100 MG per tablet immediate release 1 tablet  1 tablet Oral TID Ralene Muskrat B, MD   1 tablet at 12/17/20 0809  . Chlorhexidine Gluconate Cloth 2 % PADS 6 each  6 each Topical Daily Sidney Ace, MD   6 each at 12/17/20 [phone]  . divalproex (DEPAKOTE) DR tablet 250 mg  250 mg Oral TID Ralene Muskrat B, MD   250 mg at 12/17/20 0810  . docusate sodium (COLACE) capsule 100 mg  100 mg Oral BID Ralene Muskrat B, MD   100 mg at 12/17/20 0934  . donepezil (ARICEPT) tablet 10 mg  10 mg Oral QHS Sreenath, Sudheer B, MD   10 mg  at 12/16/20 2125  . famotidine (PEPCID) tablet 20 mg  20 mg Oral BID Ralene Muskrat B, MD   20 mg at 12/17/20 0934  . furosemide (LASIX) tablet 40 mg  40 mg Oral BID Sharion Settler, NP   40 mg at 12/17/20 0810  . hydrALAZINE (APRESOLINE) injection 10 mg  10 mg Intravenous Q4H PRN Ralene Muskrat B, MD      . MEDLINE mouth rinse  15 mL Mouth Rinse BID Ralene Muskrat B, MD   15 mL at 12/17/20 0323  . memantine (NAMENDA) tablet 10 mg  10 mg Oral BID Ralene Muskrat B, MD   10 mg at 12/17/20 0936  . metoprolol succinate (TOPROL-XL) 24 hr tablet 25 mg  25 mg Oral Daily Ralene Muskrat B, MD   25 mg at  12/17/20 0934  . mirtazapine (REMERON) tablet 15 mg  15 mg Oral QHS Ralene Muskrat B, MD   15 mg at 12/16/20 2124  . multivitamin-lutein (OCUVITE-LUTEIN) capsule 1 capsule  1 capsule Oral Daily Ralene Muskrat B, MD   1 capsule at 12/17/20 0934  . ondansetron (ZOFRAN) tablet 4 mg  4 mg Oral Q6H PRN Priscella Mann, Sudheer B, MD       Or  . ondansetron (ZOFRAN) injection 4 mg  4 mg Intravenous Q6H PRN Sreenath, Sudheer B, MD      . oxyCODONE (Oxy IR/ROXICODONE) immediate release tablet 5 mg  5 mg Oral Q4H PRN Ralene Muskrat B, MD   5 mg at 12/16/20 1925    VITAL SIGNS: BP (!) 122/106   Pulse (!) 51   Temp 97.9 F (36.6 C) (Oral)   Resp (!) 35   Ht 5' 8"  (1.727 m)   Wt 63.5 kg   SpO2 95%   BMI 21.29 kg/m  Filed Weights   12/15/20 1325 12/16/20 0500 12/17/20 0500  Weight: 63.5 kg 63.5 kg 63.5 kg    Estimated body mass index is 21.29 kg/m as calculated from the following:   Height as of this encounter: 5' 8"  (1.727 m).   Weight as of this encounter: 63.5 kg.  LABS: CBC:    Component Value Date/Time   WBC 5.1 12/16/2020 0449   HGB 12.6 (L) 12/16/2020 0449   HCT 39.9 12/16/2020 0449   PLT 208 12/16/2020 0449   Comprehensive Metabolic Panel:    Component Value Date/Time   NA 142 12/16/2020 0449   K 4.8 12/16/2020 0449   BUN 30 (H) 12/16/2020 0449   CREATININE 1.29 (H) 12/16/2020 0449   ALBUMIN 3.9 05/09/2020 2020     Review of Systems  Neurological: Positive for weakness.  All other systems reviewed and are negative. Unless otherwise noted, a complete review of systems is negative.  Physical Exam General: NAD, frail chronically-ill appearing Cardiovascular: regular rate and rhythm Pulmonary: clear ant fields, diminished right side Abdomen: soft, nontender, + bowel sounds Extremities: no edema, no joint deformities Skin: no rashes, warm and dry Neurological: awake, AAO x3, mood appropriate   Prognosis: POOR   Discharge Planning:  To Be  Determined  Recommendations: . DNR/DNI-as confirmed by patient and daughter, MOST form completed and placed on chart. See above for selections . Continue with current plan of care, treat the treatable with no escalation. Family remains hopeful patient will return to [facility] with understanding in health decline and needs.  . Discussed at length outpatient palliative versus hospice.  Family request to continue with ongoing discussions. . Patient is used to eating soft/pured food family is  requesting diet to be changed. Marland Kitchen PMT will continue to support and follow as needed. Please call team line with urgent needs.   Palliative Performance Scale: PPS 20-30%              Family expressed understanding and was in agreement with this plan.   Thank you for allowing the Palliative Medicine Team to assist in the care of this patient. Please utilize secure chat with additional questions, if there is no response within 30 minutes please call the above phone number.   Time In: 1000 Time Out: 1105 Time Total: 65 min.   Visit consisted of counseling and education dealing with the complex and emotionally intense issues of symptom management and palliative care in the setting of serious and potentially life-threatening illness.Greater than 50%  of this time was spent counseling and coordinating care related to the above assessment and plan.  Signed by:  Alda Lea, AGPCNP-BC Palliative Medicine Team  Phone: [phone] Pager: [phone] Amion: [place] Team providers are available by phone from 7am to 7pm daily and can be reached through the team cell phone.  Should this patient require assistance outside of these hours, please call the patient's attending physician.

## 2020-12-17 NOTE — Progress Notes (Addendum)
I have reviewed the charting completed by the student nurse and I agree with her assessments.   Earleen Reaper, RN

## 2020-12-17 NOTE — Progress Notes (Signed)
Pulmonary Medicine          Date: 12/17/2020,   MRN# [id number] Eric Hendricks [birth year]     AdmissionWeight: 63.5 kg                 CurrentWeight: 63.5 kg  Referring physician: Dr Priscella Mann   CHIEF COMPLAINT:   Recurrent Right pleural effusion.     HISTORY OF PRESENT ILLNESS   As per admission h/p Eric Hendricks is a 87 y.o. male with medical history significant of coronary artery disease, chronic systolic congestive heart failure, chronic hypoxic respiratory failure on 3 L paroxysmal atrial fibrillation on anticoagulation, Parkinson disease on Sinemet who presents for evaluation of shortness of breath worsening over 3 days.  Patient apparently lives at [facility] and is supposed to be wearing his 3 L oxygen all time.  His adherence is somewhat unclear. He has had 3 thoracentesis in the past.  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  He does have significant cardiac disease and is on chronic diuresis.   12/17/20- patient is sitting up on commode having BM.  Daughter in room and we spoke briefly. Patient is DNR now with palliative following.  He had good thoracentesis with improved CXR. Studies are pending but aggressive workup is unlikely to be warranted regarding of findnigs due to advanced age comorbid status  PAST MEDICAL HISTORY   Past Medical History:  Diagnosis Date  . Arrhythmia    atrial fibrillation  . B12 deficiency   . CHF (congestive heart failure) ([place])   . Dementia ([place])   . Depression   . Hyperlipemia   . Parkinson's disease ([place])   . Prostate cancer [facility])      SURGICAL HISTORY   Past Surgical History:  Procedure Laterality Date  . ANKLE SURGERY Right   . APPENDECTOMY    . CATARACT EXTRACTION Bilateral   . TRANSURETHRAL RESECTION OF PROSTATE       FAMILY HISTORY   Family History  Problem Relation Age  of Onset  . Colon cancer Father   . Tremor Mother   . Parkinson's disease Brother   . Prostate cancer Brother   . Lung cancer Sister   . Emphysema Sister   . Heart failure Child   . Diabetes Child   . AAA (abdominal aortic aneurysm) Child      SOCIAL HISTORY   Social History   Tobacco Use  . Smoking status: Never Smoker  . Smokeless tobacco: Never Used  Vaping Use  . Vaping Use: Never used  Substance Use Topics  . Alcohol use: No    Alcohol/week: 0.0 standard drinks  . Drug use: No     MEDICATIONS    Home Medication:    Current Medication:  Current Facility-Administered Medications:  .  acetaminophen (TYLENOL) tablet 650 mg, 650 mg, Oral, Q6H PRN **OR** acetaminophen (TYLENOL) suppository 650 mg, 650 mg, Rectal, Q6H PRN, Sreenath, Sudheer B, MD .  albuterol (PROVENTIL) (2.5 MG/3ML) 0.083% nebulizer solution 2.5 mg, 2.5 mg, Nebulization, Q2H PRN, Sreenath, Sudheer B, MD .  amiodarone (PACERONE) tablet 100 mg, 100 mg, Oral, Q0600, Sreenath, Sudheer B, MD, 100 mg at 12/17/20 0810 .  atorvastatin (LIPITOR) tablet 80 mg, 80 mg, Oral, QPM, Sreenath, Sudheer B, MD, 80 mg at 12/16/20 1613 .  carbidopa-levodopa (SINEMET IR) 25-100 MG per tablet immediate release 1 tablet, 1 tablet, Oral,  TID, Sidney Ace, MD, 1 tablet at 12/17/20 1218 .  Chlorhexidine Gluconate Cloth 2 % PADS 6 each, 6 each, Topical, Daily, Ralene Muskrat B, MD, 6 each at 12/17/20 0322 .  divalproex (DEPAKOTE) DR tablet 250 mg, 250 mg, Oral, TID, Priscella Mann, Sudheer B, MD, 250 mg at 12/17/20 0810 .  docusate sodium (COLACE) capsule 100 mg, 100 mg, Oral, BID, Sreenath, Sudheer B, MD, 100 mg at 12/17/20 0934 .  donepezil (ARICEPT) tablet 10 mg, 10 mg, Oral, QHS, Sreenath, Sudheer B, MD, 10 mg at 12/16/20 2125 .  famotidine (PEPCID) tablet 20 mg, 20 mg, Oral, BID, Sreenath, Sudheer B, MD, 20 mg at 12/17/20 0934 .  furosemide (LASIX) tablet 40 mg, 40 mg, Oral, BID, Sharion Settler, NP, 40 mg at 12/17/20  0810 .  hydrALAZINE (APRESOLINE) injection 10 mg, 10 mg, Intravenous, Q4H PRN, Priscella Mann, Sudheer B, MD .  MEDLINE mouth rinse, 15 mL, Mouth Rinse, BID, Sreenath, Sudheer B, MD, 15 mL at 12/17/20 0323 .  memantine (NAMENDA) tablet 10 mg, 10 mg, Oral, BID, Priscella Mann, Sudheer B, MD, 10 mg at 12/17/20 0936 .  metoprolol succinate (TOPROL-XL) 24 hr tablet 25 mg, 25 mg, Oral, Daily, Sreenath, Sudheer B, MD, 25 mg at 12/17/20 0934 .  mirtazapine (REMERON) tablet 15 mg, 15 mg, Oral, QHS, Sreenath, Sudheer B, MD, 15 mg at 12/16/20 2124 .  multivitamin-lutein (OCUVITE-LUTEIN) capsule 1 capsule, 1 capsule, Oral, Daily, Sreenath, Sudheer B, MD, 1 capsule at 12/17/20 0934 .  ondansetron (ZOFRAN) tablet 4 mg, 4 mg, Oral, Q6H PRN **OR** ondansetron (ZOFRAN) injection 4 mg, 4 mg, Intravenous, Q6H PRN, Sreenath, Sudheer B, MD .  oxyCODONE (Oxy IR/ROXICODONE) immediate release tablet 5 mg, 5 mg, Oral, Q4H PRN, Priscella Mann, Sudheer B, MD, 5 mg at 12/16/20 1925    ALLERGIES   Phenylephrine-guaifenesin, Aspirin, Guaifenesin, Guanfacine, Phenylephrine, and Suprax [cefixime]     REVIEW OF SYSTEMS    Review of Systems:  Gen:  Denies  fever, sweats, chills weigh loss  HEENT: Denies blurred vision, double vision, ear pain, eye pain, hearing loss, nose bleeds, sore throat Cardiac:  No dizziness, chest pain or heaviness, chest tightness,edema Resp:   Denies cough or sputum porduction, shortness of breath,wheezing, hemoptysis,  Gi: Denies swallowing difficulty, stomach pain, nausea or vomiting, diarrhea, constipation, bowel incontinence Gu:  Denies bladder incontinence, burning urine Ext:   Denies Joint pain, stiffness or swelling Skin: Denies  skin rash, easy bruising or bleeding or hives Endoc:  Denies polyuria, polydipsia , polyphagia or weight change Psych:   Denies depression, insomnia or hallucinations   Other:  All other systems negative   VS: BP 96/73 (BP Location: Left Arm)   Pulse 95   Temp 97.6 F  (36.4 C) (Oral)   Resp 19   Ht 5\' 8"  (1.727 m)   Wt 63.5 kg   SpO2 93%   BMI 21.29 kg/m      PHYSICAL EXAM    GENERAL:NAD, no fevers, chills, no weakness no fatigue HEAD: Normocephalic, atraumatic. +hearing loss EYES: Pupils equal, round, reactive to light. Extraocular muscles intact. No scleral icterus.  MOUTH: Moist mucosal membrane. Dentition intact. No abscess noted.  EAR, NOSE, THROAT: Clear without exudates. No external lesions.  NECK: Supple. No thyromegaly. No nodules. No JVD.  PULMONARY: Decreased breath sounds on right CARDIOVASCULAR: S1 and S2. Regular rate and rhythm. No murmurs, rubs, or gallops. No edema. Pedal pulses 2+ bilaterally.  GASTROINTESTINAL: Soft, nontender, nondistended. No masses. Positive bowel sounds. No hepatosplenomegaly.  MUSCULOSKELETAL: No  swelling, clubbing, or edema. Range of motion full in all extremities.  NEUROLOGIC: Cranial nerves II through XII are intact. No gross focal neurological deficits. Sensation intact. Reflexes intact.  SKIN: No ulceration, lesions, rashes, or cyanosis. Skin warm and dry. Turgor intact.  PSYCHIATRIC: Mood, affect within normal limits. The patient is awake, alert and oriented x 3. Insight, judgment intact.       IMAGING    DG Chest 2 View  Result Date: 12/15/2020 CLINICAL DATA:  Shortness of breath. EXAM: CHEST - 2 VIEW COMPARISON:  November 04, 2020. FINDINGS: Stable cardiomediastinal silhouette. Left-sided pacemaker is unchanged in position. No pneumothorax is noted. Interval development of moderate right pleural effusion with associated atelectasis or pneumonia. Bony thorax is unremarkable. IMPRESSION: Interval development of moderate right pleural effusion with associated atelectasis or pneumonia. Aortic Atherosclerosis (ICD10-I70.0). Electronically Signed   By: Marijo Conception M.D.   On: 12/15/2020 13:57   CT ANGIO CHEST PE W OR WO CONTRAST  Result Date: 12/15/2020 CLINICAL DATA:  Elevated D-dimer, dyspnea  EXAM: CT ANGIOGRAPHY CHEST WITH CONTRAST TECHNIQUE: Multidetector CT imaging of the chest was performed using the standard protocol during bolus administration of intravenous contrast. Multiplanar CT image reconstructions and MIPs were obtained to evaluate the vascular anatomy. CONTRAST:  74mL OMNIPAQUE IOHEXOL 350 MG/ML SOLN COMPARISON:  12/13/2018 FINDINGS: Cardiovascular: There is adequate opacification of the pulmonary arterial tree. There is no intraluminal filling defect identified to suggest acute pulmonary embolism. There is relative hypoenhancement of the right pulmonary arterial tree, likely related to vascular shunting secondary to subtotal collapse of the right lung. The central pulmonary arteries are of normal caliber. Moderate multi-vessel coronary artery calcification with possible stenting of the left anterior descending coronary artery. Global cardiac size is within normal limits. Left subclavian pacemaker leads are seen within the right atrium and right ventricle toward the apex. No pericardial effusion. Moderate atherosclerotic calcification within the thoracic aorta. No aortic aneurysm. Mediastinum/Nodes: No pathologic thoracic adenopathy. Visualized thyroid is unremarkable. Esophagus is unremarkable. Lungs/Pleura: There is near complete collapse of the right lung secondary to compressive atelectasis of a large right pleural effusion. The right middle and lower lobes are completely collapsed. There is partial aeration of the right upper lobe. No central obstructing mass is identified. The costophrenic angle is excluded from view on this examination. Small left pleural effusion is present with mild compressive atelectasis of the left lower lobe. No superimposed focal pulmonary infiltrate. No pneumothorax. Upper Abdomen: No acute abnormality within the visualized upper abdomen. Musculoskeletal: No acute bone abnormality. No lytic or blastic bone lesion identified. Review of the MIP images confirms  the above findings. IMPRESSION: No pulmonary embolism. Large right pleural effusion with subtotal collapse of the right lung. Small left pleural effusion also identified. Moderate coronary artery calcification. Aortic Atherosclerosis (ICD10-I70.0). Electronically Signed   By: Fidela Salisbury MD   On: 12/15/2020 23:25   DG Chest Port 1 View  Result Date: 12/16/2020 CLINICAL DATA:  Pleural effusion status post right thoracentesis EXAM: PORTABLE CHEST 1 VIEW COMPARISON:  12/16/2020 FINDINGS: Single frontal view of the chest demonstrates decreased right pleural effusion after thoracentesis, with no evidence of pneumothorax. Moderate residual loculated right pleural effusion with continued areas of consolidation at the right lung base. Left chest is clear. Cardiac silhouette is stable. Dual lead pacemaker unchanged. IMPRESSION: 1. Moderate residual loculated right pleural effusion after thoracentesis. No evidence of pneumothorax. Electronically Signed   By: Randa Ngo M.D.   On: 12/16/2020 15:45   DG  Chest Port 1 View  Result Date: 12/16/2020 CLINICAL DATA:  Pleural effusion and shortness of breath. EXAM: PORTABLE CHEST 1 VIEW COMPARISON:  12/15/2020 FINDINGS: Left chest wall pacer device is noted with leads in the right atrial appendage and right ventricle. Heart size is normal. Further increase in volume of right pleural effusion with near complete opacification of the right hemithorax. Increase interstitial markings in the left lung compatible with interstitial edema. IMPRESSION: Increase in volume of right pleural effusion with near complete opacification of the right hemithorax. Electronically Signed   By: Kerby Moors M.D.   On: 12/16/2020 12:52   US THORACENTESIS ASP PLEURAL SPACE W/IMG GUIDE  Result Date: 12/16/2020 CLINICAL DATA:  Recurrent right pleural effusion and status post prior thoracentesis procedures. The most recent is dated 11/04/2020. EXAM: ULTRASOUND GUIDED RIGHT THORACENTESIS  COMPARISON:  None. PROCEDURE: An ultrasound guided thoracentesis was thoroughly discussed with the patient and questions answered. The benefits, risks, alternatives and complications were also discussed. The patient understands and wishes to proceed with the procedure. Written consent was obtained. Ultrasound was performed to localize and mark an adequate pocket of fluid in the right chest. The area was then prepped and draped in the normal sterile fashion. 1% Lidocaine was used for local anesthesia. Under ultrasound guidance a 6 French Safe-T-Centesis catheter was introduced. Thoracentesis was performed. The catheter was removed and a dressing applied. COMPLICATIONS: None FINDINGS: A total of approximately 2 L of grossly bloody fluid was removed. Some fluid remain present but the procedure had to be halted due to development of significant right chest pain during evacuation. A fluid sample was sent for laboratory analysis. IMPRESSION: Successful ultrasound guided right thoracentesis yielding 2 L of bloody pleural fluid. Electronically Signed   By: Aletta Edouard M.D.   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, this is likely due to cardiac and renal failure  - his BNP is elevated > 700 - pH is mildly elevated sometimes can be seen in chylous effusion and should check triglycerides and chilomicrons on next thoracentesis but points away from infection -Additional more rare possibilties of lymphocyte predominat pleural effusion include Uremia from CKD, radiation therapy, Sarcoidosis, TB, chronic autoimmune disease - since cytology was negative this is unlikely malignancy but we will check again with repeat thoracentsis -would recommend tunneled pleural catheter as palliation of recurrent fluid for this nanogenerian as he is likely to have a painful and prolonged hospitalization if considering pleurodesis.  -will  continue to follow. -will add additional studies to ordered thoracentesis including - pleural fluid BNP, AFB, Triglyceride and chilomicron examination       Thank you for allowing me to participate in the care of this patient.   Patient/Family are satisfied with care plan and all questions have been answered.  This document was prepared using Dragon voice recognition software and may include unintentional dictation errors.     Ottie Glazier, M.D.  Division of [place]

## 2020-12-18 ENCOUNTER — Encounter: Payer: Self-pay | Admitting: Internal Medicine

## 2020-12-18 LAB — BASIC METABOLIC PANEL
Anion gap: 9 (ref 5–15)
BUN: 36 mg/dL — ABNORMAL HIGH (ref 8–23)
CO2: 30 mmol/L (ref 22–32)
Calcium: 7.9 mg/dL — ABNORMAL LOW (ref 8.9–10.3)
Chloride: 99 mmol/L (ref 98–111)
Creatinine, Ser: 1.42 mg/dL — ABNORMAL HIGH (ref 0.61–1.24)
GFR, Estimated: 46 mL/min — ABNORMAL LOW (ref >60)
Glucose, Bld: 107 mg/dL — ABNORMAL HIGH (ref 70–99)
Potassium: 4.1 mmol/L (ref 3.5–5.1)
Sodium: 138 mmol/L (ref 135–145)

## 2020-12-18 LAB — CBC
HCT: 36.5 % — ABNORMAL LOW (ref 39.0–52.0)
Hemoglobin: 11.5 g/dL — ABNORMAL LOW (ref 13.0–17.0)
MCH: 31 pg (ref 26.0–34.0)
MCHC: 31.5 g/dL (ref 30.0–36.0)
MCV: 98.4 fL (ref 80.0–100.0)
Platelets: 202 10*3/uL (ref 150–400)
RBC: 3.71 MIL/uL — ABNORMAL LOW (ref 4.22–5.81)
RDW: 16.6 % — ABNORMAL HIGH (ref 11.5–15.5)
WBC: 6.6 10*3/uL (ref 4.0–10.5)
nRBC: 0 % (ref 0.0–0.2)

## 2020-12-18 MED ORDER — FAMOTIDINE 20 MG PO TABS
20.0000 mg | ORAL_TABLET | Freq: Every day | ORAL | Status: DC
  Administered 2020-12-19: 20 mg via ORAL
  Filled 2020-12-18: qty 1

## 2020-12-18 NOTE — Progress Notes (Addendum)
Progress Note    Eric Hendricks  EHU:[id number] DOB: [birth year]  DOA: 12/15/2020 PCP: [name], MD      Brief Narrative:    Medical records reviewed and are as summarized below:  Eric Hendricks is a 87 y.o. male       Assessment/Plan:   Active Problems:   Acute respiratory failure with hypoxia (HCC)   Pleural effusion due to CHF (congestive heart failure) (HCC)   S/P thoracentesis     Body mass index is 21.35 kg/m.    Right-sided pleural effusion: S/p right-sided thoracentesis on 12/16/2020 with removal of 2 L of fluid.  Cytology was negative for malignancy.  Patient and his daughter are not interested in any surgical procedures or escalation of care.  They understand that pleural effusion is likely to recur.  Case discussed with pulmonologist, Dr. Lanney Gins, who is planning to sign off because family does not want any escalation of care.  Acute on chronic hypoxic respiratory failure: He is back on 3 L/min oxygen which is his baseline.  Chronic systolic CHF: Compensated.  Continue Lasix and metoprolol.  Chronic atrial fibrillation: Not on anticoagulation because of high bleeding risk  Parkinson's disease: Continue Sinemet and Depakote  Generalized weakness: PT and OT evaluation.  Plan for discharge to ALF tomorrow.  Plan of care was discussed with his daughter, Ms. Faith Husler.   Diet Order            DIET - DYS 1 Room service appropriate? Yes; Fluid consistency: Thin  Diet effective now                    Consultants:  Pulmonologist  Procedures:  Right-sided thoracentesis on 12/16/2020    Medications:   . amiodarone  100 mg Oral Q0600  . atorvastatin  80 mg Oral QPM  . carbidopa-levodopa  1 tablet Oral TID  . Chlorhexidine Gluconate Cloth  6 each Topical Daily  . divalproex  250 mg Oral TID  . docusate sodium  100 mg Oral BID  . donepezil  10 mg Oral QHS  . [START ON 12/19/2020] famotidine  20 mg Oral Daily  . furosemide  40 mg  Oral BID  . mouth rinse  15 mL Mouth Rinse BID  . memantine  10 mg Oral BID  . metoprolol succinate  25 mg Oral Daily  . mirtazapine  15 mg Oral QHS  . multivitamin-lutein  1 capsule Oral Daily   Continuous Infusions:   Anti-infectives (From admission, onward)   None             Family Communication/Anticipated D/C date and plan/Code Status   DVT prophylaxis: SCDs Start: 12/15/20 1456     Code Status: DNR  Family Communication: Daughter, Ms. Husler Disposition Plan:    Status is: Inpatient  Remains inpatient appropriate because:Inpatient level of care appropriate due to severity of illness   Dispo: The patient is from: ALF              Anticipated d/c is to: ALF              Patient currently is not medically stable to d/c.   Difficult to place patient No           Subjective:   Interval events noted.  No shortness of breath or chest pain.  Objective:    Vitals:   12/18/20 0522 12/18/20 0735 12/18/20 1108 12/18/20 1514  BP: 97/72 93/64 96/64  (!) 78/58  Pulse: 90 73 71 79  Resp: 18 16 17 16   Temp: 98.2 F (36.8 C) 97.8 F (36.6 C) 98.2 F (36.8 C) 97.8 F (36.6 C)  TempSrc:  Oral    SpO2: 100% 100% 97% 96%  Weight: 63.7 kg     Height:       No data found.   Intake/Output Summary (Last 24 hours) at 12/18/2020 1648 Last data filed at 12/18/2020 1345 Gross per 24 hour  Intake 360 ml  Output --  Net 360 ml   Filed Weights   12/16/20 0500 12/17/20 0500 12/18/20 0522  Weight: 63.5 kg 63.5 kg 63.7 kg    Exam:  GEN: NAD SKIN: No rash EYES: EOMI ENT: MMM, very hard of hearing CV: RRR PULM: CTA B ABD: soft, ND, NT, +BS CNS: AAO x 3, non focal EXT: No edema or tenderness        Data Reviewed:   I have personally reviewed following labs and imaging studies:  Labs: Labs show the following:   Basic Metabolic Panel: Recent Labs  Lab 12/15/20 1330 12/15/20 2043 12/16/20 0449 12/18/20 0617  NA 139  --  142 138  K 4.4   --  4.8 4.1  CL 101  --  101 99  CO2 28  --  33* 30  GLUCOSE 114*  --  104* 107*  BUN 35*  --  30* 36*  CREATININE 1.38*  --  1.29* 1.42*  CALCIUM 8.5*  --  8.7* 7.9*  MG  --  2.5*  --   --    GFR Estimated Creatinine Clearance: 28.7 mL/min (A) (by C-G formula based on SCr of 1.42 mg/dL (H)). Liver Function Tests: No results for input(s): AST, ALT, ALKPHOS, BILITOT, PROT, ALBUMIN in the last 168 hours. No results for input(s): LIPASE, AMYLASE in the last 168 hours. No results for input(s): AMMONIA in the last 168 hours. Coagulation profile No results for input(s): INR, PROTIME in the last 168 hours.  CBC: Recent Labs  Lab 12/15/20 1330 12/16/20 0449 12/18/20 0617  WBC 6.2 5.1 6.6  NEUTROABS 5.1  --   --   HGB 12.2* 12.6* 11.5*  HCT 38.8* 39.9 36.5*  MCV 98.0 99.3 98.4  PLT 224 208 202   Cardiac Enzymes: No results for input(s): CKTOTAL, CKMB, CKMBINDEX, TROPONINI in the last 168 hours. BNP (last 3 results) No results for input(s): PROBNP in the last 8760 hours. CBG: No results for input(s): GLUCAP in the last 168 hours. D-Dimer: Recent Labs    12/15/20 2043  DDIMER 2.56*   Hgb A1c: No results for input(s): HGBA1C in the last 72 hours. Lipid Profile: Recent Labs    12/16/20 1334  TRIG 78   Thyroid function studies: Recent Labs    12/15/20 2043  TSH 9.575*   Anemia work up: No results for input(s): VITAMINB12, FOLATE, FERRITIN, TIBC, IRON, RETICCTPCT in the last 72 hours. Sepsis Labs: Recent Labs  Lab 12/15/20 1330 12/15/20 1419 12/15/20 2043 12/15/20 2308 12/16/20 0449 12/18/20 0617  PROCALCITON  --  <0.10  --   --   --   --   WBC 6.2  --   --   --  5.1 6.6  LATICACIDVEN  --   --  1.0 1.5  --   --     Microbiology Recent Results (from the past 240 hour(s))  SARS CORONAVIRUS 2 (TAT 6-24 HRS) Nasopharyngeal Nasopharyngeal Swab     Status: None   Collection Time: 12/15/20  1:50  PM   Specimen: Nasopharyngeal Swab  Result Value Ref Range Status    SARS Coronavirus 2 NEGATIVE NEGATIVE Final    Comment: (NOTE) SARS-CoV-2 target nucleic acids are NOT DETECTED.  The SARS-CoV-2 RNA is generally detectable in upper and lower respiratory specimens during the acute phase of infection. Negative results do not preclude SARS-CoV-2 infection, do not rule out co-infections with other pathogens, and should not be used as the sole basis for treatment or other patient management decisions. Negative results must be combined with clinical observations, patient history, and epidemiological information. The expected result is Negative.  Fact Sheet for Patients: SugarRoll.be  Fact Sheet for Healthcare Providers: https://www.woods-mathews.com/  This test is not yet approved or cleared by the [place] FDA and  has been authorized for detection and/or diagnosis of SARS-CoV-2 by FDA under an Emergency Use Authorization (EUA). This EUA will remain  in effect (meaning this test can be used) for the duration of the COVID-19 declaration under Se ction 564(b)(1) of the Act, 21 U.S.C. section 360bbb-3(b)(1), unless the authorization is terminated or revoked sooner.  Performed at [facility], [place] [address] [place], [place] [address]   MRSA PCR Screening     Status: None   Collection Time: 12/15/20  8:00 PM   Specimen: Nasopharyngeal  Result Value Ref Range Status   MRSA by PCR NEGATIVE NEGATIVE Final    Comment:        The GeneXpert MRSA Assay (FDA approved for NASAL specimens only), is one component of a comprehensive MRSA colonization surveillance program. It is not intended to diagnose MRSA infection nor to guide or monitor treatment for MRSA infections. Performed at [facility], [address] [place], [place] [address]   Body fluid culture w Gram Stain     Status: None (Preliminary result)   Collection Time: 12/16/20  3:41 PM   Specimen: PATH Cytology Pleural fluid   Result Value Ref Range Status   Specimen Description   Final    PLEURAL Performed at [facility], [address] [place], [place] [address]    Special Requests   Final    NONE Performed at [facility], [address] [place], [place] [address]    Gram Stain   Final    FEW WBC PRESENT,BOTH PMN AND MONONUCLEAR NO ORGANISMS SEEN    Culture   Final    NO GROWTH 1 DAY Performed at [facility], [place] [address], [place], [place] [address]    Report Status PENDING  Incomplete    Procedures and diagnostic studies:  No results found.             LOS: 2 days   Najir Roop  [facility]   Pager on [email or web address]. If 7PM-7AM, please contact night-coverage at [email or web address]     12/18/2020, 4:48 PM

## 2020-12-18 NOTE — Plan of Care (Signed)
  Problem: Clinical Measurements: Goal: Will remain free from infection Outcome: Progressing Goal: Respiratory complications will improve Outcome: Progressing Goal: Cardiovascular complication will be avoided Outcome: Progressing   Problem: Activity: Goal: Risk for activity intolerance will decrease Outcome: Progressing   Problem: Nutrition: Goal: Adequate nutrition will be maintained Outcome: Progressing   

## 2020-12-18 NOTE — Evaluation (Signed)
Occupational Therapy Evaluation Patient Details Name: Eric Hendricks MRN: [id number] DOB: 17-Oct-1923 Today's Date: 12/18/2020    History of Present Illness Pt is a 87 y/o M with PMH: parkinson's disease, dementia, CAD, sCHF, and depression. Pt presented to ED from [facility] d/t SOB over 3 days. Pt being tx'ed for recurrent pleural effusion.   Clinical Impression   Pt seen for OT evaluation this date in setting of acute hospitalization d/t recurrent pleural effusion. Pt presents this date with some SOB impacting his ability to efficiently and safely perform his ADLs coupled with some baseline weakness secondary to comorbidities and advanced age. Pt is only minimally verbal with OT throughout session and is noted to require some increased processing time, but is appropriate conversationally and reasonably follows ~30-40% of simple one step commands. OT engages pt in seated UB bathing tasks with SETUP to MIN A and pt requires MOD A for posterior LB bathing in standing with RW for balance (assist primarily d/t low standing tolerance/low tolerance for exertional tasks at this time). Pt requires MOD A to thread socks at this time as bending at the waist requires increased exertion for LB ADLs. Pt able to stand with RW with MIN/MOD A and requires MOD A to pivot transfer to chair. Pt left in chair with chair alarm and all needs met/in reach. Will continue to follow acutely and anticipate pt will be able to return to [facility] ALF and initiate HHOT with rehabilitation services in that setting upon d/c to ensure that he resumes his prior performance level with ADLs/ADL transfers.     Follow Up Recommendations  Home health OT (at his ALF)    Equipment Recommendations  None recommended by OT (has all necessary equipment at facility.)    Recommendations for Other Services       Precautions / Restrictions Precautions Precautions: Fall Restrictions Weight Bearing Restrictions: No      Mobility Bed  Mobility Overal bed mobility: Needs Assistance Bed Mobility: Supine to Sit     Supine to sit: Min assist;Mod assist;HOB elevated     General bed mobility comments: increased time, use of rails, cues to advance LEs towards EOB.    Transfers Overall transfer level: Needs assistance Equipment used: Rolling walker (2 wheeled) Transfers: Sit to/from [facility] Sit to Stand: Min assist;Mod assist Stand pivot transfers: Mod assist       General transfer comment: cues for safe use of RW.    Balance Overall balance assessment: Needs assistance Sitting-balance support: Feet supported Sitting balance-Leahy Scale: Fair     Standing balance support: Bilateral upper extremity supported Standing balance-Leahy Scale: Poor Standing balance comment: requires UE support on RW and MOD A                           ADL either performed or assessed with clinical judgement   ADL Overall ADL's : Needs assistance/impaired                                       General ADL Comments: MIN/MOD A with seated UB ADLs with cues to sequence, MOD/MAX A with standing LB bathing and MOD A to don socks laying in bed using "figure-4" position.     Vision Patient Visual Report: No change from baseline       Perception     Praxis  Pertinent Vitals/Pain Pain Assessment: Faces Faces Pain Scale: No hurt     Hand Dominance     Extremity/Trunk Assessment Upper Extremity Assessment Upper Extremity Assessment: Generalized weakness   Lower Extremity Assessment Lower Extremity Assessment: Generalized weakness       Communication Communication Communication: HOH   Cognition Arousal/Alertness: Awake/alert Behavior During Therapy: WFL for tasks assessed/performed Overall Cognitive Status: Difficult to assess                                 General Comments: pt able to follow ~30-40% of simple one step commands with increased processing  time and low/slow speech to ensure he is able to hear. He is appropriate conversationally when able to hear, but is poor historian and only oriented to self.   General Comments       Exercises Other Exercises Other Exercises: OT engages pt in seated bathing and dressing tasks as well as transfers with RW with MIN tactile cues for safe hand placement and manual assist to [facility] walker while trying to pivot.   Shoulder Instructions      Home Living Family/patient expects to be discharged to:: Assisted living                             Home Equipment: Gilford Rile - 2 wheels;Shower seat          Prior Functioning/Environment Level of Independence: Needs assistance  Gait / Transfers Assistance Needed: Pt reports using RW for fxl mobility in the ALF. He is poor historian, but [facility] confirms that he walks with walker and occasional assist d/t low tolerance. ADL's / Homemaking Assistance Needed: Per [facility], he is INDEP with dressing and toileting. He has assist for bathing and IADLs such as meals, laundry and med mgt.            OT Problem List: Decreased strength;Impaired balance (sitting and/or standing);Decreased cognition;Decreased activity tolerance;Cardiopulmonary status limiting activity      OT Treatment/Interventions: Self-care/ADL training;DME and/or AE instruction;Therapeutic activities;Balance training;Therapeutic exercise;Energy conservation;Patient/family education;Neuromuscular education    OT Goals(Current goals can be found in the care plan section) Acute Rehab OT Goals Patient Stated Goal: none stated OT Goal Formulation: With patient Time For Goal Achievement: 01/01/21 Potential to Achieve Goals: Good ADL Goals Pt Will Perform Upper Body Dressing: with set-up;sitting Pt Will Transfer to Toilet: with min guard assist;ambulating;grab bars (with LRAD to [facility]) Pt/caregiver will Perform Home Exercise Program: Increased strength;Both right and left  upper extremity;With minimal assist  OT Frequency: Min 1X/week   Barriers to D/C:            Co-evaluation              AM-PAC OT "6 Clicks" Daily Activity     Outcome Measure Help from another person eating meals?: None Help from another person taking care of personal grooming?: A Little Help from another person toileting, which includes using toliet, bedpan, or urinal?: A Lot Help from another person bathing (including washing, rinsing, drying)?: A Lot Help from another person to put on and taking off regular upper body clothing?: A Little Help from another person to put on and taking off regular lower body clothing?: A Lot 6 Click Score: 16   End of Session Equipment Utilized During Treatment: Gait belt;Rolling walker;Oxygen Nurse Communication: Mobility status  Activity Tolerance: Patient tolerated treatment well Patient left: in  chair;with call bell/phone within reach;with chair alarm set  OT Visit Diagnosis: Unsteadiness on feet (R26.81);Muscle weakness (generalized) (M62.81)                Time: 3893-7342 OT Time Calculation (min): 46 min Charges:  OT General Charges $OT Visit: 1 Visit OT Evaluation $OT Eval Moderate Complexity: 1 Mod OT Treatments $Self Care/Home Management : 23-37 mins $Therapeutic Activity: 8-22 mins  Gerrianne Scale, MS, OTR/L ascom [phone] 12/18/20, 6:02 PM

## 2020-12-18 NOTE — Progress Notes (Signed)
Pulmonary Medicine          Date: 12/18/2020,   MRN# [id number] Eric Hendricks [birth year]     AdmissionWeight: 63.5 kg                 CurrentWeight: 63.7 kg  Referring physician: Dr Priscella Mann   CHIEF COMPLAINT:   Recurrent Right pleural effusion.     HISTORY OF PRESENT ILLNESS   As per admission h/p Eric Hendricks is a 87 y.o. male with medical history significant of coronary artery disease, chronic systolic congestive heart failure, chronic hypoxic respiratory failure on 3 L paroxysmal atrial fibrillation on anticoagulation, Parkinson disease on Sinemet who presents for evaluation of shortness of breath worsening over 3 days.  Patient apparently lives at [facility] and is supposed to be wearing his 3 L oxygen all time.  His adherence is somewhat unclear. He has had 3 thoracentesis in the past.  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  He does have significant cardiac disease and is on chronic diuresis.   12/17/20- patient is sitting up on commode having BM.  Daughter in room and we spoke briefly. Patient is DNR now with palliative following.  He had good thoracentesis with improved CXR. Studies are pending but aggressive workup is unlikely to be warranted regarding of findnigs due to advanced age comorbid status  12/18/20- patient resting in bed comfortably he is not complaining of SOB. There is ongoing conversation regarding end of life care and I will sign off at this time but will be available if needed.   PAST MEDICAL HISTORY   Past Medical History:  Diagnosis Date  . Arrhythmia    atrial fibrillation  . B12 deficiency   . CHF (congestive heart failure) ([place])   . Dementia ([place])   . Depression   . Hyperlipemia   . Parkinson's disease ([place])   . Prostate cancer [facility])      SURGICAL HISTORY   Past Surgical History:  Procedure  Laterality Date  . ANKLE SURGERY Right   . APPENDECTOMY    . CATARACT EXTRACTION Bilateral   . TRANSURETHRAL RESECTION OF PROSTATE       FAMILY HISTORY   Family History  Problem Relation Age of Onset  . Colon cancer Father   . Tremor Mother   . Parkinson's disease Brother   . Prostate cancer Brother   . Lung cancer Sister   . Emphysema Sister   . Heart failure Child   . Diabetes Child   . AAA (abdominal aortic aneurysm) Child      SOCIAL HISTORY   Social History   Tobacco Use  . Smoking status: Never Smoker  . Smokeless tobacco: Never Used  Vaping Use  . Vaping Use: Never used  Substance Use Topics  . Alcohol use: No    Alcohol/week: 0.0 standard drinks  . Drug use: No     MEDICATIONS    Home Medication:    Current Medication:  Current Facility-Administered Medications:  .  acetaminophen (TYLENOL) tablet 650 mg, 650 mg, Oral, Q6H PRN **OR** acetaminophen (TYLENOL) suppository 650 mg, 650 mg, Rectal, Q6H PRN, Sreenath, Sudheer B, MD .  albuterol (PROVENTIL) (2.5 MG/3ML) 0.083% nebulizer solution 2.5 mg, 2.5 mg, Nebulization, Q2H PRN, Sreenath, Sudheer B, MD .  amiodarone (PACERONE) tablet 100 mg, 100 mg, Oral, Q0600, Priscella Mann, Sudheer B, MD, 100 mg at 12/18/20  9833 .  atorvastatin (LIPITOR) tablet 80 mg, 80 mg, Oral, QPM, Sreenath, Sudheer B, MD, 80 mg at 12/17/20 1748 .  carbidopa-levodopa (SINEMET IR) 25-100 MG per tablet immediate release 1 tablet, 1 tablet, Oral, TID, Priscella Mann, Sudheer B, MD, 1 tablet at 12/18/20 1021 .  Chlorhexidine Gluconate Cloth 2 % PADS 6 each, 6 each, Topical, Daily, Ralene Muskrat B, MD, 6 each at 12/17/20 2233 .  divalproex (DEPAKOTE) DR tablet 250 mg, 250 mg, Oral, TID, Sreenath, Sudheer B, MD, 250 mg at 12/18/20 1021 .  docusate sodium (COLACE) capsule 100 mg, 100 mg, Oral, BID, Sreenath, Sudheer B, MD, 100 mg at 12/18/20 1021 .  donepezil (ARICEPT) tablet 10 mg, 10 mg, Oral, QHS, Sreenath, Sudheer B, MD, 10 mg at 12/17/20  2229 .  famotidine (PEPCID) tablet 20 mg, 20 mg, Oral, BID, Sreenath, Sudheer B, MD, 20 mg at 12/18/20 1021 .  furosemide (LASIX) tablet 40 mg, 40 mg, Oral, BID, Sharion Settler, NP, 40 mg at 12/18/20 1021 .  hydrALAZINE (APRESOLINE) injection 10 mg, 10 mg, Intravenous, Q4H PRN, Priscella Mann, Sudheer B, MD .  MEDLINE mouth rinse, 15 mL, Mouth Rinse, BID, Sreenath, Sudheer B, MD, 15 mL at 12/18/20 1032 .  memantine (NAMENDA) tablet 10 mg, 10 mg, Oral, BID, Sreenath, Sudheer B, MD, 10 mg at 12/18/20 1021 .  metoprolol succinate (TOPROL-XL) 24 hr tablet 25 mg, 25 mg, Oral, Daily, Sreenath, Sudheer B, MD, 25 mg at 12/18/20 1021 .  mirtazapine (REMERON) tablet 15 mg, 15 mg, Oral, QHS, Sreenath, Sudheer B, MD, 15 mg at 12/17/20 2229 .  multivitamin-lutein (OCUVITE-LUTEIN) capsule 1 capsule, 1 capsule, Oral, Daily, Sreenath, Sudheer B, MD, 1 capsule at 12/18/20 1021 .  ondansetron (ZOFRAN) tablet 4 mg, 4 mg, Oral, Q6H PRN **OR** ondansetron (ZOFRAN) injection 4 mg, 4 mg, Intravenous, Q6H PRN, Sreenath, Sudheer B, MD .  oxyCODONE (Oxy IR/ROXICODONE) immediate release tablet 5 mg, 5 mg, Oral, Q4H PRN, Priscella Mann, Sudheer B, MD, 5 mg at 12/16/20 1925    ALLERGIES   Phenylephrine-guaifenesin, Aspirin, Guaifenesin, Guanfacine, Phenylephrine, and Suprax [cefixime]     REVIEW OF SYSTEMS    Review of Systems:  Gen:  Denies  fever, sweats, chills weigh loss  HEENT: Denies blurred vision, double vision, ear pain, eye pain, hearing loss, nose bleeds, sore throat Cardiac:  No dizziness, chest pain or heaviness, chest tightness,edema Resp:   Denies cough or sputum porduction, shortness of breath,wheezing, hemoptysis,  Gi: Denies swallowing difficulty, stomach pain, nausea or vomiting, diarrhea, constipation, bowel incontinence Gu:  Denies bladder incontinence, burning urine Ext:   Denies Joint pain, stiffness or swelling Skin: Denies  skin rash, easy bruising or bleeding or hives Endoc:  Denies polyuria,  polydipsia , polyphagia or weight change Psych:   Denies depression, insomnia or hallucinations   Other:  All other systems negative   VS: BP 96/64 (BP Location: Right Arm)   Pulse 71   Temp 98.2 F (36.8 C)   Resp 17   Ht 5\' 8"  (1.727 m)   Wt 63.7 kg   SpO2 97%   BMI 21.35 kg/m      PHYSICAL EXAM    GENERAL:NAD, no fevers, chills, no weakness no fatigue HEAD: Normocephalic, atraumatic. +hearing loss EYES: Pupils equal, round, reactive to light. Extraocular muscles intact. No scleral icterus.  MOUTH: Moist mucosal membrane. Dentition intact. No abscess noted.  EAR, NOSE, THROAT: Clear without exudates. No external lesions.  NECK: Supple. No thyromegaly. No nodules. No JVD.  PULMONARY: Decreased breath sounds  on right CARDIOVASCULAR: S1 and S2. Regular rate and rhythm. No murmurs, rubs, or gallops. No edema. Pedal pulses 2+ bilaterally.  GASTROINTESTINAL: Soft, nontender, nondistended. No masses. Positive bowel sounds. No hepatosplenomegaly.  MUSCULOSKELETAL: No swelling, clubbing, or edema. Range of motion full in all extremities.  NEUROLOGIC: Cranial nerves II through XII are intact. No gross focal neurological deficits. Sensation intact. Reflexes intact.  SKIN: No ulceration, lesions, rashes, or cyanosis. Skin warm and dry. Turgor intact.  PSYCHIATRIC: Mood, affect within normal limits. The patient is awake, alert and oriented x 3. Insight, judgment intact.       IMAGING    DG Chest 2 View  Result Date: 12/15/2020 CLINICAL DATA:  Shortness of breath. EXAM: CHEST - 2 VIEW COMPARISON:  November 04, 2020. FINDINGS: Stable cardiomediastinal silhouette. Left-sided pacemaker is unchanged in position. No pneumothorax is noted. Interval development of moderate right pleural effusion with associated atelectasis or pneumonia. Bony thorax is unremarkable. IMPRESSION: Interval development of moderate right pleural effusion with associated atelectasis or pneumonia. Aortic  Atherosclerosis (ICD10-I70.0). Electronically Signed   By: Marijo Conception M.D.   On: 12/15/2020 13:57   CT ANGIO CHEST PE W OR WO CONTRAST  Result Date: 12/15/2020 CLINICAL DATA:  Elevated D-dimer, dyspnea EXAM: CT ANGIOGRAPHY CHEST WITH CONTRAST TECHNIQUE: Multidetector CT imaging of the chest was performed using the standard protocol during bolus administration of intravenous contrast. Multiplanar CT image reconstructions and MIPs were obtained to evaluate the vascular anatomy. CONTRAST:  55mL OMNIPAQUE IOHEXOL 350 MG/ML SOLN COMPARISON:  12/13/2018 FINDINGS: Cardiovascular: There is adequate opacification of the pulmonary arterial tree. There is no intraluminal filling defect identified to suggest acute pulmonary embolism. There is relative hypoenhancement of the right pulmonary arterial tree, likely related to vascular shunting secondary to subtotal collapse of the right lung. The central pulmonary arteries are of normal caliber. Moderate multi-vessel coronary artery calcification with possible stenting of the left anterior descending coronary artery. Global cardiac size is within normal limits. Left subclavian pacemaker leads are seen within the right atrium and right ventricle toward the apex. No pericardial effusion. Moderate atherosclerotic calcification within the thoracic aorta. No aortic aneurysm. Mediastinum/Nodes: No pathologic thoracic adenopathy. Visualized thyroid is unremarkable. Esophagus is unremarkable. Lungs/Pleura: There is near complete collapse of the right lung secondary to compressive atelectasis of a large right pleural effusion. The right middle and lower lobes are completely collapsed. There is partial aeration of the right upper lobe. No central obstructing mass is identified. The costophrenic angle is excluded from view on this examination. Small left pleural effusion is present with mild compressive atelectasis of the left lower lobe. No superimposed focal pulmonary infiltrate.  No pneumothorax. Upper Abdomen: No acute abnormality within the visualized upper abdomen. Musculoskeletal: No acute bone abnormality. No lytic or blastic bone lesion identified. Review of the MIP images confirms the above findings. IMPRESSION: No pulmonary embolism. Large right pleural effusion with subtotal collapse of the right lung. Small left pleural effusion also identified. Moderate coronary artery calcification. Aortic Atherosclerosis (ICD10-I70.0). Electronically Signed   By: Fidela Salisbury MD   On: 12/15/2020 23:25   DG Chest Port 1 View  Result Date: 12/16/2020 CLINICAL DATA:  Pleural effusion status post right thoracentesis EXAM: PORTABLE CHEST 1 VIEW COMPARISON:  12/16/2020 FINDINGS: Single frontal view of the chest demonstrates decreased right pleural effusion after thoracentesis, with no evidence of pneumothorax. Moderate residual loculated right pleural effusion with continued areas of consolidation at the right lung base. Left chest is clear. Cardiac silhouette is  stable. Dual lead pacemaker unchanged. IMPRESSION: 1. Moderate residual loculated right pleural effusion after thoracentesis. No evidence of pneumothorax. Electronically Signed   By: Randa Ngo M.D.   On: 12/16/2020 15:45   DG Chest Port 1 View  Result Date: 12/16/2020 CLINICAL DATA:  Pleural effusion and shortness of breath. EXAM: PORTABLE CHEST 1 VIEW COMPARISON:  12/15/2020 FINDINGS: Left chest wall pacer device is noted with leads in the right atrial appendage and right ventricle. Heart size is normal. Further increase in volume of right pleural effusion with near complete opacification of the right hemithorax. Increase interstitial markings in the left lung compatible with interstitial edema. IMPRESSION: Increase in volume of right pleural effusion with near complete opacification of the right hemithorax. Electronically Signed   By: Kerby Moors M.D.   On: 12/16/2020 12:52   US THORACENTESIS ASP PLEURAL SPACE W/IMG  GUIDE  Result Date: 12/16/2020 CLINICAL DATA:  Recurrent right pleural effusion and status post prior thoracentesis procedures. The most recent is dated 11/04/2020. EXAM: ULTRASOUND GUIDED RIGHT THORACENTESIS COMPARISON:  None. PROCEDURE: An ultrasound guided thoracentesis was thoroughly discussed with the patient and questions answered. The benefits, risks, alternatives and complications were also discussed. The patient understands and wishes to proceed with the procedure. Written consent was obtained. Ultrasound was performed to localize and mark an adequate pocket of fluid in the right chest. The area was then prepped and draped in the normal sterile fashion. 1% Lidocaine was used for local anesthesia. Under ultrasound guidance a 6 French Safe-T-Centesis catheter was introduced. Thoracentesis was performed. The catheter was removed and a dressing applied. COMPLICATIONS: None FINDINGS: A total of approximately 2 L of grossly bloody fluid was removed. Some fluid remain present but the procedure had to be halted due to development of significant right chest pain during evacuation. A fluid sample was sent for laboratory analysis. IMPRESSION: Successful ultrasound guided right thoracentesis yielding 2 L of bloody pleural fluid. Electronically Signed   By: Aletta Edouard M.D.   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, this is likely due to cardiac and renal failure  - his BNP is elevated > 700 - pH is mildly elevated sometimes can be seen in chylous effusion and should check triglycerides and chilomicrons on next thoracentesis but points away from infection -Additional more rare possibilties of lymphocyte predominat pleural effusion include Uremia from CKD, radiation therapy, Sarcoidosis, TB, chronic autoimmune disease - since cytology was negative this is unlikely malignancy but we will check again with repeat  thoracentsis -would recommend tunneled pleural catheter as palliation of recurrent fluid for this nanogenerian as he is likely to have a painful and prolonged hospitalization if considering pleurodesis.  -will continue to follow. -will add additional studies to ordered thoracentesis including - pleural fluid BNP, AFB, Triglyceride and chilomicron examination       Thank you for allowing me to participate in the care of this patient.   Patient/Family are satisfied with care plan and all questions have been answered.  This document was prepared using Dragon voice recognition software and may include unintentional dictation errors.     Ottie Glazier, M.D.  Division of [facility]

## 2020-12-19 ENCOUNTER — Inpatient Hospital Stay: Payer: Medicare Other

## 2020-12-19 DIAGNOSIS — I509 Heart failure, unspecified: Secondary | ICD-10-CM

## 2020-12-19 LAB — CYTOLOGY - NON PAP

## 2020-12-19 LAB — COMP PANEL: LEUKEMIA/LYMPHOMA

## 2020-12-19 LAB — ACID FAST SMEAR (AFB, MYCOBACTERIA): Acid Fast Smear: NEGATIVE

## 2020-12-19 MED ORDER — METOPROLOL SUCCINATE ER 25 MG PO TB24
12.5000 mg | ORAL_TABLET | Freq: Every day | ORAL | Status: AC
Start: 2020-12-19 — End: ?

## 2020-12-19 MED ORDER — METOPROLOL SUCCINATE ER 25 MG PO TB24: 12.5000 mg | ORAL_TABLET | Freq: Every day | ORAL | Status: DC

## 2020-12-19 NOTE — Procedures (Addendum)
PROCEDURE SUMMARY:  Successful US guided right thoracentesis. Yielded 1.1 L of bloody fluid. Pt tolerated procedure well. No immediate complications.  The fluid in the lower half of the right lung space is loculated. Please see imaging in Epic.   CXR ordered; no post-procedure pneumothorax identified  EBL < 2 mL  Theresa Duty, NP 12/19/2020 4:20 PM

## 2020-12-19 NOTE — Discharge Summary (Addendum)
Physician Discharge Summary  Eric Hendricks VVO:[id number] DOB: Jul 11, 1924 DOA: 12/15/2020  PCP: [name], MD  Admit date: 12/15/2020 Discharge date: 12/19/2020  Discharge disposition: Assisted living facility   Recommendations for Outpatient Follow-Up:   Follow-up with PCP in 1 week Follow-up with CT surgeon as scheduled   Discharge Diagnosis:   Active Problems:   Acute respiratory failure with hypoxia (HCC)   Pleural effusion due to CHF (congestive heart failure) ([place])   S/P thoracentesis    Discharge Condition: Stable.     Code Status: DNR     Hospital Course:   Eric Hendricks is a 87 year old man with medical history significant for CAD, chronic systolic CHF, chronic hypoxic respiratory failure on 3 L/min oxygen, paroxysmal atrial fibrillation, Parkinson's disease, recurrent right-sided pleural effusion requiring thoracentesis.  He presented to the hospital because of worsening shortness of breath.  He was admitted to the hospital for recurrent right pleural effusion and acute on chronic hypoxic respiratory failure.  He underwent right-sided thoracentesis with removal of 2 L of grossly bloody fluid.  Pleural fluid cytology was negative for malignancy.  Right-sided thoracentesis was repeated on the day of discharge.  About 1 L of bloody fluid was removed from the right pleural space.  Etiology of recurrent pleural effusion is not clear.  Patient and his daughter declined any surgical procedures or escalation of care.  They understand that there is an increased risk for recurrence of pleural effusion.  Discharge plan was discussed with his daughter, Ms. Faith Husler, who is okay with plan to discharge back to assisted living facility without any further intervention.     Medical Consultants:    Pulmonologist   Discharge Exam:    Vitals:   12/19/20 1203 12/19/20 1258 12/19/20 1431 12/19/20 1614  BP: 91/62 94/60 (!) 91/59 101/60  Pulse: 98 73 61 99   Resp: (!) 34 (!) 34 18 18  Temp: 97.9 F (36.6 C) 97.8 F (36.6 C) 97.6 F (36.4 C)   TempSrc: Oral Oral Oral   SpO2: 100% 99% 99% 100%  Weight:      Height:         GEN: NAD SKIN: No rash EYES: EOMI ENT: Very hard of hearing CV: RRR PULM: CTA B ABD: soft, ND, NT, +BS CNS: AAO x 3, non focal EXT: No edema or tenderness   The results of significant diagnostics from this hospitalization (including imaging, microbiology, ancillary and laboratory) are listed below for reference.     Procedures and Diagnostic Studies:   DG Chest 2 View  Result Date: 12/15/2020 CLINICAL DATA:  Shortness of breath. EXAM: CHEST - 2 VIEW COMPARISON:  November 04, 2020. FINDINGS: Stable cardiomediastinal silhouette. Left-sided pacemaker is unchanged in position. No pneumothorax is noted. Interval development of moderate right pleural effusion with associated atelectasis or pneumonia. Bony thorax is unremarkable. IMPRESSION: Interval development of moderate right pleural effusion with associated atelectasis or pneumonia. Aortic Atherosclerosis (ICD10-I70.0). Electronically Signed   By: Marijo Conception M.D.   On: 12/15/2020 13:57   CT ANGIO CHEST PE W OR WO CONTRAST  Result Date: 12/15/2020 CLINICAL DATA:  Elevated D-dimer, dyspnea EXAM: CT ANGIOGRAPHY CHEST WITH CONTRAST TECHNIQUE: Multidetector CT imaging of the chest was performed using the standard protocol during bolus administration of intravenous contrast. Multiplanar CT image reconstructions and MIPs were obtained to evaluate the vascular anatomy. CONTRAST:  71mL OMNIPAQUE IOHEXOL 350 MG/ML SOLN COMPARISON:  12/13/2018 FINDINGS: Cardiovascular: There is adequate opacification of the pulmonary arterial tree.  There is no intraluminal filling defect identified to suggest acute pulmonary embolism. There is relative hypoenhancement of the right pulmonary arterial tree, likely related to vascular shunting secondary to subtotal collapse of the right lung.  The central pulmonary arteries are of normal caliber. Moderate multi-vessel coronary artery calcification with possible stenting of the left anterior descending coronary artery. Global cardiac size is within normal limits. Left subclavian pacemaker leads are seen within the right atrium and right ventricle toward the apex. No pericardial effusion. Moderate atherosclerotic calcification within the thoracic aorta. No aortic aneurysm. Mediastinum/Nodes: No pathologic thoracic adenopathy. Visualized thyroid is unremarkable. Esophagus is unremarkable. Lungs/Pleura: There is near complete collapse of the right lung secondary to compressive atelectasis of a large right pleural effusion. The right middle and lower lobes are completely collapsed. There is partial aeration of the right upper lobe. No central obstructing mass is identified. The costophrenic angle is excluded from view on this examination. Small left pleural effusion is present with mild compressive atelectasis of the left lower lobe. No superimposed focal pulmonary infiltrate. No pneumothorax. Upper Abdomen: No acute abnormality within the visualized upper abdomen. Musculoskeletal: No acute bone abnormality. No lytic or blastic bone lesion identified. Review of the MIP images confirms the above findings. IMPRESSION: No pulmonary embolism. Large right pleural effusion with subtotal collapse of the right lung. Small left pleural effusion also identified. Moderate coronary artery calcification. Aortic Atherosclerosis (ICD10-I70.0). Electronically Signed   By: Fidela Salisbury MD   On: 12/15/2020 23:25   DG Chest Port 1 View  Result Date: 12/16/2020 CLINICAL DATA:  Pleural effusion status post right thoracentesis EXAM: PORTABLE CHEST 1 VIEW COMPARISON:  12/16/2020 FINDINGS: Single frontal view of the chest demonstrates decreased right pleural effusion after thoracentesis, with no evidence of pneumothorax. Moderate residual loculated right pleural effusion with  continued areas of consolidation at the right lung base. Left chest is clear. Cardiac silhouette is stable. Dual lead pacemaker unchanged. IMPRESSION: 1. Moderate residual loculated right pleural effusion after thoracentesis. No evidence of pneumothorax. Electronically Signed   By: Randa Ngo M.D.   On: 12/16/2020 15:45   DG Chest Port 1 View  Result Date: 12/16/2020 CLINICAL DATA:  Pleural effusion and shortness of breath. EXAM: PORTABLE CHEST 1 VIEW COMPARISON:  12/15/2020 FINDINGS: Left chest wall pacer device is noted with leads in the right atrial appendage and right ventricle. Heart size is normal. Further increase in volume of right pleural effusion with near complete opacification of the right hemithorax. Increase interstitial markings in the left lung compatible with interstitial edema. IMPRESSION: Increase in volume of right pleural effusion with near complete opacification of the right hemithorax. Electronically Signed   By: Kerby Moors M.D.   On: 12/16/2020 12:52   US THORACENTESIS ASP PLEURAL SPACE W/IMG GUIDE  Result Date: 12/16/2020 CLINICAL DATA:  Recurrent right pleural effusion and status post prior thoracentesis procedures. The most recent is dated 11/04/2020. EXAM: ULTRASOUND GUIDED RIGHT THORACENTESIS COMPARISON:  None. PROCEDURE: An ultrasound guided thoracentesis was thoroughly discussed with the patient and questions answered. The benefits, risks, alternatives and complications were also discussed. The patient understands and wishes to proceed with the procedure. Written consent was obtained. Ultrasound was performed to localize and mark an adequate pocket of fluid in the right chest. The area was then prepped and draped in the normal sterile fashion. 1% Lidocaine was used for local anesthesia. Under ultrasound guidance a 6 French Safe-T-Centesis catheter was introduced. Thoracentesis was performed. The catheter was removed and a dressing applied.  COMPLICATIONS: None FINDINGS:  A total of approximately 2 L of grossly bloody fluid was removed. Some fluid remain present but the procedure had to be halted due to development of significant right chest pain during evacuation. A fluid sample was sent for laboratory analysis. IMPRESSION: Successful ultrasound guided right thoracentesis yielding 2 L of bloody pleural fluid. Electronically Signed   By: Aletta Edouard M.D.   On: 12/16/2020 15:33     Labs:   Basic Metabolic Panel: Recent Labs  Lab 12/15/20 1330 12/15/20 2043 12/16/20 0449 12/18/20 0617  NA 139  --  142 138  K 4.4  --  4.8 4.1  CL 101  --  101 99  CO2 28  --  33* 30  GLUCOSE 114*  --  104* 107*  BUN 35*  --  30* 36*  CREATININE 1.38*  --  1.29* 1.42*  CALCIUM 8.5*  --  8.7* 7.9*  MG  --  2.5*  --   --    GFR Estimated Creatinine Clearance: 29 mL/min (A) (by C-G formula based on SCr of 1.42 mg/dL (H)). Liver Function Tests: No results for input(s): AST, ALT, ALKPHOS, BILITOT, PROT, ALBUMIN in the last 168 hours. No results for input(s): LIPASE, AMYLASE in the last 168 hours. No results for input(s): AMMONIA in the last 168 hours. Coagulation profile No results for input(s): INR, PROTIME in the last 168 hours.  CBC: Recent Labs  Lab 12/15/20 1330 12/16/20 0449 12/18/20 0617  WBC 6.2 5.1 6.6  NEUTROABS 5.1  --   --   HGB 12.2* 12.6* 11.5*  HCT 38.8* 39.9 36.5*  MCV 98.0 99.3 98.4  PLT 224 208 202   Cardiac Enzymes: No results for input(s): CKTOTAL, CKMB, CKMBINDEX, TROPONINI in the last 168 hours. BNP: Invalid input(s): POCBNP CBG: No results for input(s): GLUCAP in the last 168 hours. D-Dimer No results for input(s): DDIMER in the last 72 hours. Hgb A1c No results for input(s): HGBA1C in the last 72 hours. Lipid Profile No results for input(s): CHOL, HDL, LDLCALC, TRIG, CHOLHDL, LDLDIRECT in the last 72 hours. Thyroid function studies No results for input(s): TSH, T4TOTAL, T3FREE, THYROIDAB in the last 72 hours.  Invalid  input(s): FREET3 Anemia work up No results for input(s): VITAMINB12, FOLATE, FERRITIN, TIBC, IRON, RETICCTPCT in the last 72 hours. Microbiology Recent Results (from the past 240 hour(s))  SARS CORONAVIRUS 2 (TAT 6-24 HRS) Nasopharyngeal Nasopharyngeal Swab     Status: None   Collection Time: 12/15/20  1:50 PM   Specimen: Nasopharyngeal Swab  Result Value Ref Range Status   SARS Coronavirus 2 NEGATIVE NEGATIVE Final    Comment: (NOTE) SARS-CoV-2 target nucleic acids are NOT DETECTED.  The SARS-CoV-2 RNA is generally detectable in upper and lower respiratory specimens during the acute phase of infection. Negative results do not preclude SARS-CoV-2 infection, do not rule out co-infections with other pathogens, and should not be used as the sole basis for treatment or other patient management decisions. Negative results must be combined with clinical observations, patient history, and epidemiological information. The expected result is Negative.  Fact Sheet for Patients: SugarRoll.be  Fact Sheet for Healthcare Providers: https://www.woods-mathews.com/  This test is not yet approved or cleared by the [place] FDA and  has been authorized for detection and/or diagnosis of SARS-CoV-2 by FDA under an Emergency Use Authorization (EUA). This EUA will remain  in effect (meaning this test can be used) for the duration of the COVID-19 declaration under Se ction 564(b)(1) of  the Act, 21 U.S.C. section 360bbb-3(b)(1), unless the authorization is terminated or revoked sooner.  Performed at [facility], [place] [address], [place], [place] [address]   MRSA PCR Screening     Status: None   Collection Time: 12/15/20  8:00 PM   Specimen: Nasopharyngeal  Result Value Ref Range Status   MRSA by PCR NEGATIVE NEGATIVE Final    Comment:        The GeneXpert MRSA Assay (FDA approved for NASAL specimens only), is one component of a comprehensive  MRSA colonization surveillance program. It is not intended to diagnose MRSA infection nor to guide or monitor treatment for MRSA infections. Performed at [facility], [place], [place] [address]   Acid Fast Smear (AFB)     Status: None   Collection Time: 12/16/20  2:41 PM   Specimen: Pleural, Right; Respiratory  Result Value Ref Range Status   AFB Specimen Processing Concentration  Final   Acid Fast Smear Negative  Final    Comment: (NOTE) Performed At: [facility] [place], [place] [id number] Rush Farmer MD AX:[id number]    Source (AFB) PLEURAL  Final    Comment: Performed at [facility], [address] [place], [place] [address]  Body fluid culture w Gram Stain     Status: None (Preliminary result)   Collection Time: 12/16/20  3:41 PM   Specimen: PATH Cytology Pleural fluid  Result Value Ref Range Status   Specimen Description   Final    PLEURAL Performed at [facility], [address], [place], [place] [address]    Special Requests   Final    NONE Performed at [facility], [address] [place], [place] [address]    Gram Stain   Final    FEW WBC PRESENT,BOTH PMN AND MONONUCLEAR NO ORGANISMS SEEN    Culture   Final    NO GROWTH 2 DAYS Performed at [facility], [place] [address] [place], [place] [address]    Report Status PENDING  Incomplete     Discharge Instructions:   Discharge Instructions    Diet Heart   Complete by: As directed    Pureed diet   Increase activity slowly   Complete by: As directed      Allergies as of 12/19/2020      Reactions   Phenylephrine-guaifenesin Palpitations   Aspirin Other (See Comments)   Guaifenesin Other (See Comments)   Unknown reaction   Guanfacine    Phenylephrine    Suprax [cefixime]       Medication List    STOP taking these medications   traMADol 50 MG tablet Commonly known as: ULTRAM     TAKE these medications    acetaminophen 325 MG tablet Commonly known as: TYLENOL Take 650 mg by mouth 2 (two) times daily.   acetaminophen 500 MG tablet Commonly known as: TYLENOL Take 500 mg by mouth daily as needed for mild pain.   amiodarone 200 MG tablet Commonly known as: PACERONE Take 100 mg by mouth daily.   ascorbic acid 500 MG tablet Commonly known as: VITAMIN C Take 500 mg by mouth daily.   aspirin EC 81 MG tablet Take 81 mg by mouth daily.   atorvastatin 80 MG tablet Commonly known as: LIPITOR Take 80 mg by mouth every evening.   carbidopa-levodopa 25-100 MG tablet Commonly known as: SINEMET IR Take 1 tablet by mouth 3 (three) times daily. 1 tablet at  9am/1pm/5pm.  Please give meds 20 min prior to meal or 1 hour after the meal   [employer] Lubricant Eye Drops 0.4-0.3 % Soln Generic drug: Polyethyl Glycol-Propyl Glycol Place 2 drops into both eyes every 4 (four) hours as needed (dry eyes).   divalproex 250 MG DR tablet Commonly known as: Depakote Take 1 tablet (250 mg total) by mouth 3 (three) times daily.   docusate sodium 100 MG capsule Commonly known as: COLACE Take 100 mg by mouth 2 (two) times daily as needed for mild constipation or moderate constipation.   donepezil 10 MG tablet Commonly known as: ARICEPT TAKE 1 TABLET BY MOUTH AT BEDTIME   famotidine 20 MG tablet Commonly known as: PEPCID Take 20 mg by mouth 2 (two) times daily.   furosemide 40 MG tablet Commonly known as: LASIX Take 1 tablet (40 mg total) by mouth 2 (two) times daily.   memantine 10 MG tablet Commonly known as: NAMENDA TAKE 1 TABLET BY MOUTH TWICE A DAY   metoprolol succinate 25 MG 24 hr tablet Commonly known as: TOPROL-XL Take 0.5 tablets (12.5 mg total) by mouth daily. What changed: how much to take   mirtazapine 15 MG tablet Commonly known as: REMERON Take 15 mg by mouth at bedtime.         Time coordinating discharge: 35 minutes  Signed:  [facility] 12/19/2020,  5:16 PM   Pager on [email or web address]. If 7PM-7AM, please contact night-coverage at [email or web address]

## 2020-12-19 NOTE — Progress Notes (Signed)
Pulmonary Medicine          Date: 12/19/2020,   MRN# [id number] Eric Hendricks [birth year]     AdmissionWeight: 63.5 kg                 CurrentWeight: 64.5 kg  Referring physician: Dr Priscella Mann   CHIEF COMPLAINT:   Recurrent Right pleural effusion.     HISTORY OF PRESENT ILLNESS   As per admission h/p Eric Hendricks is a 87 y.o. male with medical history significant of coronary artery disease, chronic systolic congestive heart failure, chronic hypoxic respiratory failure on 3 L paroxysmal atrial fibrillation on anticoagulation, Parkinson disease on Sinemet who presents for evaluation of shortness of breath worsening over 3 days.  Patient apparently lives at [facility] [place] and is supposed to be wearing his 3 L oxygen all time.  His adherence is somewhat unclear. He has had 3 thoracentesis in the past.  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  He does have significant cardiac disease and is on chronic diuresis.   12/17/20- patient is sitting up on commode having BM.  Daughter in room and we spoke briefly. Patient is DNR now with palliative following.  He had good thoracentesis with improved CXR. Studies are pending but aggressive workup is unlikely to be warranted regarding of findnigs due to advanced age comorbid status  12/18/20- patient resting in bed comfortably he is not complaining of SOB. There is ongoing conversation regarding end of life care and I will sign off at this time but will be available if needed.   12/19/20- patient resting in bed in no distress. He is on room air.  I reviewed care plan with Palliative care team and [facility] attending physician Dr Mal Misty today. Seems family do wish to proceed with discharge and discuss additional options with thoracic surgery which is fine. I would favor less aggressive approach with thi man due to advanced  age.   PAST MEDICAL HISTORY   Past Medical History:  Diagnosis Date  . Arrhythmia    atrial fibrillation  . B12 deficiency   . CHF (congestive heart failure) ([place])   . Dementia ([place])   . Depression   . Hyperlipemia   . Parkinson's disease ([place])   . Prostate cancer [facility])      SURGICAL HISTORY   Past Surgical History:  Procedure Laterality Date  . ANKLE SURGERY Right   . APPENDECTOMY    . CATARACT EXTRACTION Bilateral   . TRANSURETHRAL RESECTION OF PROSTATE       FAMILY HISTORY   Family History  Problem Relation Age of Onset  . Colon cancer Father   . Tremor Mother   . Parkinson's disease Brother   . Prostate cancer Brother   . Lung cancer Sister   . Emphysema Sister   . Heart failure Child   . Diabetes Child   . AAA (abdominal aortic aneurysm) Child      SOCIAL HISTORY   Social History   Tobacco Use  . Smoking status: Never Smoker  . Smokeless tobacco: Never Used  Vaping Use  . Vaping Use: Never used  Substance Use Topics  . Alcohol use: No    Alcohol/week: 0.0 standard drinks  . Drug use: No     MEDICATIONS    Home Medication:    Current Medication:  Current Facility-Administered Medications:  .  acetaminophen (TYLENOL)  tablet 650 mg, 650 mg, Oral, Q6H PRN **OR** acetaminophen (TYLENOL) suppository 650 mg, 650 mg, Rectal, Q6H PRN, Sreenath, Sudheer B, MD .  albuterol (PROVENTIL) (2.5 MG/3ML) 0.083% nebulizer solution 2.5 mg, 2.5 mg, Nebulization, Q2H PRN, Sreenath, Sudheer B, MD .  amiodarone (PACERONE) tablet 100 mg, 100 mg, Oral, Q0600, Priscella Mann, Sudheer B, MD, 100 mg at 12/19/20 0557 .  atorvastatin (LIPITOR) tablet 80 mg, 80 mg, Oral, QPM, Sreenath, Sudheer B, MD, 80 mg at 12/18/20 1714 .  carbidopa-levodopa (SINEMET IR) 25-100 MG per tablet immediate release 1 tablet, 1 tablet, Oral, TID, Priscella Mann, Sudheer B, MD, 1 tablet at 12/19/20 1107 .  Chlorhexidine Gluconate Cloth 2 % PADS 6 each, 6 each, Topical, Daily, Priscella Mann, Sudheer B, MD, 6  each at 12/18/20 2200 .  divalproex (DEPAKOTE) DR tablet 250 mg, 250 mg, Oral, TID, Sreenath, Sudheer B, MD, 250 mg at 12/19/20 1109 .  docusate sodium (COLACE) capsule 100 mg, 100 mg, Oral, BID, Sreenath, Sudheer B, MD, 100 mg at 12/19/20 1105 .  donepezil (ARICEPT) tablet 10 mg, 10 mg, Oral, QHS, Sreenath, Sudheer B, MD, 10 mg at 12/18/20 2239 .  famotidine (PEPCID) tablet 20 mg, 20 mg, Oral, Daily, Oswald Hillock, RPH, 20 mg at 12/19/20 1106 .  furosemide (LASIX) tablet 40 mg, 40 mg, Oral, BID, Sharion Settler, NP, 40 mg at 12/19/20 1121 .  hydrALAZINE (APRESOLINE) injection 10 mg, 10 mg, Intravenous, Q4H PRN, Priscella Mann, Sudheer B, MD .  MEDLINE mouth rinse, 15 mL, Mouth Rinse, BID, Sreenath, Sudheer B, MD, 15 mL at 12/18/20 2200 .  memantine (NAMENDA) tablet 10 mg, 10 mg, Oral, BID, Sreenath, Sudheer B, MD, 10 mg at 12/19/20 1106 .  [START ON 12/20/2020] metoprolol succinate (TOPROL-XL) 24 hr tablet 12.5 mg, 12.5 mg, Oral, Daily, Jennye Boroughs, MD .  mirtazapine (REMERON) tablet 15 mg, 15 mg, Oral, QHS, Sreenath, Sudheer B, MD, 15 mg at 12/18/20 2239 .  multivitamin-lutein (OCUVITE-LUTEIN) capsule 1 capsule, 1 capsule, Oral, Daily, Sreenath, Sudheer B, MD, 1 capsule at 12/19/20 1107 .  ondansetron (ZOFRAN) tablet 4 mg, 4 mg, Oral, Q6H PRN **OR** ondansetron (ZOFRAN) injection 4 mg, 4 mg, Intravenous, Q6H PRN, Sreenath, Sudheer B, MD .  oxyCODONE (Oxy IR/ROXICODONE) immediate release tablet 5 mg, 5 mg, Oral, Q4H PRN, Priscella Mann, Sudheer B, MD, 5 mg at 12/16/20 1925    ALLERGIES   Phenylephrine-guaifenesin, Aspirin, Guaifenesin, Guanfacine, Phenylephrine, and Suprax [cefixime]     REVIEW OF SYSTEMS    Review of Systems:  Gen:  Denies  fever, sweats, chills weigh loss  HEENT: Denies blurred vision, double vision, ear pain, eye pain, hearing loss, nose bleeds, sore throat Cardiac:  No dizziness, chest pain or heaviness, chest tightness,edema Resp:   Denies cough or sputum porduction,  shortness of breath,wheezing, hemoptysis,  Gi: Denies swallowing difficulty, stomach pain, nausea or vomiting, diarrhea, constipation, bowel incontinence Gu:  Denies bladder incontinence, burning urine Ext:   Denies Joint pain, stiffness or swelling Skin: Denies  skin rash, easy bruising or bleeding or hives Endoc:  Denies polyuria, polydipsia , polyphagia or weight change Psych:   Denies depression, insomnia or hallucinations   Other:  All other systems negative   VS: BP 91/62 (BP Location: Right Arm)   Pulse 98   Temp 97.9 F (36.6 C) (Oral)   Resp (!) 34   Ht 5\' 8"  (1.727 m)   Wt 64.5 kg   SpO2 100%   BMI 21.61 kg/m      PHYSICAL EXAM  GENERAL:NAD, no fevers, chills, no weakness no fatigue HEAD: Normocephalic, atraumatic. +hearing loss EYES: Pupils equal, round, reactive to light. Extraocular muscles intact. No scleral icterus.  MOUTH: Moist mucosal membrane. Dentition intact. No abscess noted.  EAR, NOSE, THROAT: Clear without exudates. No external lesions.  NECK: Supple. No thyromegaly. No nodules. No JVD.  PULMONARY: Decreased breath sounds on right CARDIOVASCULAR: S1 and S2. Regular rate and rhythm. No murmurs, rubs, or gallops. No edema. Pedal pulses 2+ bilaterally.  GASTROINTESTINAL: Soft, nontender, nondistended. No masses. Positive bowel sounds. No hepatosplenomegaly.  MUSCULOSKELETAL: No swelling, clubbing, or edema. Range of motion full in all extremities.  NEUROLOGIC: Cranial nerves II through XII are intact. No gross focal neurological deficits. Sensation intact. Reflexes intact.  SKIN: No ulceration, lesions, rashes, or cyanosis. Skin warm and dry. Turgor intact.  PSYCHIATRIC: Mood, affect within normal limits. The patient is awake, alert and oriented x 3. Insight, judgment intact.       IMAGING    DG Chest 2 View  Result Date: 12/15/2020 CLINICAL DATA:  Shortness of breath. EXAM: CHEST - 2 VIEW COMPARISON:  November 04, 2020. FINDINGS: Stable  cardiomediastinal silhouette. Left-sided pacemaker is unchanged in position. No pneumothorax is noted. Interval development of moderate right pleural effusion with associated atelectasis or pneumonia. Bony thorax is unremarkable. IMPRESSION: Interval development of moderate right pleural effusion with associated atelectasis or pneumonia. Aortic Atherosclerosis (ICD10-I70.0). Electronically Signed   By: Marijo Conception M.D.   On: 12/15/2020 13:57   CT ANGIO CHEST PE W OR WO CONTRAST  Result Date: 12/15/2020 CLINICAL DATA:  Elevated D-dimer, dyspnea EXAM: CT ANGIOGRAPHY CHEST WITH CONTRAST TECHNIQUE: Multidetector CT imaging of the chest was performed using the standard protocol during bolus administration of intravenous contrast. Multiplanar CT image reconstructions and MIPs were obtained to evaluate the vascular anatomy. CONTRAST:  37mL OMNIPAQUE IOHEXOL 350 MG/ML SOLN COMPARISON:  12/13/2018 FINDINGS: Cardiovascular: There is adequate opacification of the pulmonary arterial tree. There is no intraluminal filling defect identified to suggest acute pulmonary embolism. There is relative hypoenhancement of the right pulmonary arterial tree, likely related to vascular shunting secondary to subtotal collapse of the right lung. The central pulmonary arteries are of normal caliber. Moderate multi-vessel coronary artery calcification with possible stenting of the left anterior descending coronary artery. Global cardiac size is within normal limits. Left subclavian pacemaker leads are seen within the right atrium and right ventricle toward the apex. No pericardial effusion. Moderate atherosclerotic calcification within the thoracic aorta. No aortic aneurysm. Mediastinum/Nodes: No pathologic thoracic adenopathy. Visualized thyroid is unremarkable. Esophagus is unremarkable. Lungs/Pleura: There is near complete collapse of the right lung secondary to compressive atelectasis of a large right pleural effusion. The right  middle and lower lobes are completely collapsed. There is partial aeration of the right upper lobe. No central obstructing mass is identified. The costophrenic angle is excluded from view on this examination. Small left pleural effusion is present with mild compressive atelectasis of the left lower lobe. No superimposed focal pulmonary infiltrate. No pneumothorax. Upper Abdomen: No acute abnormality within the visualized upper abdomen. Musculoskeletal: No acute bone abnormality. No lytic or blastic bone lesion identified. Review of the MIP images confirms the above findings. IMPRESSION: No pulmonary embolism. Large right pleural effusion with subtotal collapse of the right lung. Small left pleural effusion also identified. Moderate coronary artery calcification. Aortic Atherosclerosis (ICD10-I70.0). Electronically Signed   By: Fidela Salisbury MD   On: 12/15/2020 23:25   DG Chest Port 1 View  Result Date:  12/19/2020 CLINICAL DATA:  Dyspnea EXAM: PORTABLE CHEST 1 VIEW COMPARISON:  12/16/2020 FINDINGS: Dual lead pacer. Patient rotated right. Midline trachea. Mild cardiomegaly. Atherosclerosis in the transverse aorta. Moderate to large right pleural effusion with suggestion of loculation laterally, increased. No left pleural effusion. No pneumothorax. Mild pulmonary venous congestion. Increased right and similar left base airspace disease. IMPRESSION: Increase in moderate to large right pleural effusion with suggestion of loculation laterally. Adjacent right base airspace disease which could represent atelectasis or infection. Similar left base Airspace disease, likely atelectasis. Aortic Atherosclerosis (ICD10-I70.0). Electronically Signed   By: Abigail Miyamoto M.D.   On: 12/19/2020 11:48   DG Chest Port 1 View  Result Date: 12/16/2020 CLINICAL DATA:  Pleural effusion status post right thoracentesis EXAM: PORTABLE CHEST 1 VIEW COMPARISON:  12/16/2020 FINDINGS: Single frontal view of the chest demonstrates decreased  right pleural effusion after thoracentesis, with no evidence of pneumothorax. Moderate residual loculated right pleural effusion with continued areas of consolidation at the right lung base. Left chest is clear. Cardiac silhouette is stable. Dual lead pacemaker unchanged. IMPRESSION: 1. Moderate residual loculated right pleural effusion after thoracentesis. No evidence of pneumothorax. Electronically Signed   By: Randa Ngo M.D.   On: 12/16/2020 15:45   DG Chest Port 1 View  Result Date: 12/16/2020 CLINICAL DATA:  Pleural effusion and shortness of breath. EXAM: PORTABLE CHEST 1 VIEW COMPARISON:  12/15/2020 FINDINGS: Left chest wall pacer device is noted with leads in the right atrial appendage and right ventricle. Heart size is normal. Further increase in volume of right pleural effusion with near complete opacification of the right hemithorax. Increase interstitial markings in the left lung compatible with interstitial edema. IMPRESSION: Increase in volume of right pleural effusion with near complete opacification of the right hemithorax. Electronically Signed   By: Kerby Moors M.D.   On: 12/16/2020 12:52   US THORACENTESIS ASP PLEURAL SPACE W/IMG GUIDE  Result Date: 12/16/2020 CLINICAL DATA:  Recurrent right pleural effusion and status post prior thoracentesis procedures. The most recent is dated 11/04/2020. EXAM: ULTRASOUND GUIDED RIGHT THORACENTESIS COMPARISON:  None. PROCEDURE: An ultrasound guided thoracentesis was thoroughly discussed with the patient and questions answered. The benefits, risks, alternatives and complications were also discussed. The patient understands and wishes to proceed with the procedure. Written consent was obtained. Ultrasound was performed to localize and mark an adequate pocket of fluid in the right chest. The area was then prepped and draped in the normal sterile fashion. 1% Lidocaine was used for local anesthesia. Under ultrasound guidance a 6 French Safe-T-Centesis  catheter was introduced. Thoracentesis was performed. The catheter was removed and a dressing applied. COMPLICATIONS: None FINDINGS: A total of approximately 2 L of grossly bloody fluid was removed. Some fluid remain present but the procedure had to be halted due to development of significant right chest pain during evacuation. A fluid sample was sent for laboratory analysis. IMPRESSION: Successful ultrasound guided right thoracentesis yielding 2 L of bloody pleural fluid. Electronically Signed   By: Aletta Edouard M.D.   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, this is likely due to cardiac and renal failure  - his BNP is elevated > 700 - pH is mildly elevated sometimes can be seen in chylous effusion and should check triglycerides and chilomicrons on next thoracentesis but points away from infection -Additional more rare possibilties of lymphocyte predominat pleural effusion include Uremia from  CKD, radiation therapy, Sarcoidosis, TB, chronic autoimmune disease - since cytology was negative this is unlikely malignancy but we will check again with repeat thoracentsis -would recommend tunneled pleural catheter as palliation of recurrent fluid for this nanogenerian as he is likely to have a painful and prolonged hospitalization if considering pleurodesis.  -will add additional studies to ordered thoracentesis including - pleural fluid BNP, AFB, Triglyceride and chilomicron examination       Thank you for allowing me to participate in the care of this patient.   Patient/Family are satisfied with care plan and all questions have been answered.  This document was prepared using Dragon voice recognition software and may include unintentional dictation errors.     Ottie Glazier, M.D.  Division of [place]

## 2020-12-19 NOTE — TOC Progression Note (Addendum)
Transition of Care [facility]) - Progression Note    Patient Details  Name: Rodrigues Urbanek MRN: [id number] Date of Birth: [birth year]  Transition of Care [facility]) CM/SW [place], RN Phone Number: 12/19/2020, 4:17 PM  Clinical Narrative: Will delay discharge orders until Monday, notified [facility] Admission Nurse Earlyne Iba, [phone]. Ms. Nyoka Cowden says they do no do admissions on the weekends. Patient will also need speech consult for diet order. Per Ms.Green, they offer Pureed, Chopped and Ground meat diet. Attending notified.  Signed FL2 emailed to Earlyne Iba as requested.         Expected Discharge Plan and Services           Expected Discharge Date: 12/19/20                                     Social Determinants of Health (SDOH) Interventions    Readmission Risk Interventions No flowsheet data found.

## 2020-12-19 NOTE — Progress Notes (Signed)
Patient discharged to [facility] via EMS. IV removed. Telemetry. All discharge documentation completed.

## 2020-12-19 NOTE — Progress Notes (Signed)
   Daily Progress Note   Patient Name: Eric Hendricks       Date: 12/19/2020 DOB: 23-Nov-1923  Age: 87 y.o. MRN#: [id number] Attending Physician: Jennye Boroughs, MD Primary Care Physician: [name], MD Admit Date: 12/15/2020  Reason for Consultation/Follow-up: Establishing goals of care  Subjective: Patient is awake and alert in bed.  No acute distress.  Denies pain or shortness of breath. No family at the bedside.   I spoke with daughter and provided updates. Long discussion regarding role of Palliative in her father's care. Family expresses wishes to continue to outpatient every opportunity to thrive.  Daughter shares they have outpatient referral to see cardiothoracic surgeon and would like to proceed with that appointment at discharge.  We discussed at length patient's overall condition, prognosis, and consideration of his advanced age.  Daughter verbalizes understanding and appreciation expressing family would not want to proceed with aggressive interventions but would like to make sure they have a clear understanding of all available options allowing patient to have an opportunity for continued improvement/stability.  They would not be interested in aggressive interventions however again expresses their wishes to gain understanding of options, risk and benefits allowing him to make the best informed decision.  Family is hopeful patient will discharge home today. Family states they will contact outpatient palliative once patient returns back to [facility] and get back into their routine.   All questions answered and support provided.   Length of Stay: 3 days  Vital Signs: BP (!) 98/51 (BP Location: Right Arm)   Pulse 69   Temp (!) 97.4 F (36.3 C) (Oral)   Resp (!) 36   Ht 5\' 8"  (1.727 m)   Wt 64.5 kg   SpO2 97%   BMI 21.61 kg/m  SpO2: SpO2: 97 % O2 Device: O2 Device: Nasal Cannula O2 Flow Rate: O2 Flow Rate (L/min): 3 L/min  Physical Exam: Awake, NAD RRR Diminished  on right Hard of hearing, mood appropriate            Palliative Care Assessment & Plan  Goals of Care/Recommendations:  Family hopeful for continued improvement/stability. States plans to follow-up with CTS outpatient (current referral in place).   Detailed discussion regarding current illness, co-morbidities, age, and long-term prognosis. Family requesting to contact Palliative outpatient once patient discharges. Hopeful he will return home today.    Thank you for allowing the Palliative Medicine Team to assist in the care of this patient.  Time Total:40 min.   Visit consisted of counseling and education dealing with the complex and emotionally intense issues of symptom management and palliative care in the setting of serious and potentially life-threatening illness.Greater than 50%  of this time was spent counseling and coordinating care related to the above assessment and plan.  Alda Lea, AGPCNP-BC  Palliative Medicine Team [phone]

## 2020-12-19 NOTE — NC FL2 (Signed)
[place] LEVEL OF CARE SCREENING TOOL     IDENTIFICATION  Patient Name: Eric Hendricks Birthdate: 1924/05/31 Sex: male Admission Date (Current Location): 12/15/2020  [place] and [place] Number:  Engineering geologist and Address:  [facility], [address], [place], [place] [address]      Provider Number: [id number]  Attending Physician Name and Address:  Jennye Boroughs, MD  Relative Name and Phone Number:  Brantley Persons [phone]    Current Level of Care: Hospital Recommended Level of Care: [place] Prior Approval Number:    Date Approved/Denied:   PASRR Number:    Discharge Plan: Other (Comment) ([place])    Current Diagnoses: Patient Active Problem List   Diagnosis Date Noted  . S/P thoracentesis   . Pleural effusion due to CHF (congestive heart failure) ([place]) 12/15/2020  . Acute respiratory failure with hypoxia ([place]) 05/10/2020  . Acute on chronic systolic CHF (congestive heart failure) ([place]) 05/10/2020  . Pleural effusion, right 05/09/2020  . Acute on chronic systolic heart failure ([place]) 11/21/2019  . AF (paroxysmal atrial fibrillation) ([place]) 11/21/2019  . Depression 11/21/2019  . Dementia due to Parkinson's disease without behavioral disturbance ([place]) 11/21/2019    Orientation RESPIRATION BLADDER Height & Weight     Self  O2 (3L [place]) External catheter Weight: 64.5 kg Height:  5\' 8"  (172.7 cm)  BEHAVIORAL SYMPTOMS/MOOD NEUROLOGICAL BOWEL NUTRITION STATUS      Continent Diet  AMBULATORY STATUS COMMUNICATION OF NEEDS Skin   Limited Assist Verbally Normal                       Personal Care Assistance Level of Assistance  Bathing,Feeding,Dressing Bathing Assistance: Limited assistance Feeding assistance: Independent Dressing Assistance: Limited assistance     Functional Limitations Info  Sight,Hearing,Speech Sight Info: Adequate Hearing Info: Impaired (Hard of  hearing) Speech Info: Adequate    SPECIAL CARE FACTORS FREQUENCY  OT (By licensed OT)                    Contractures Contractures Info: Not present    Additional Factors Info  Code Status,Allergies Code Status Info: DNR Allergies Info: Aspirin, Guaifenesin, Guanfacine, Phenylephrine, Suprax           Current Medications (12/19/2020):  This is the current hospital active medication list Current Facility-Administered Medications  Medication Dose Route Frequency Provider Last Rate Last Admin  . acetaminophen (TYLENOL) tablet 650 mg  650 mg Oral Q6H PRN Ralene Muskrat B, MD       Or  . acetaminophen (TYLENOL) suppository 650 mg  650 mg Rectal Q6H PRN Sreenath, Sudheer B, MD      . albuterol (PROVENTIL) (2.5 MG/3ML) 0.083% nebulizer solution 2.5 mg  2.5 mg Nebulization Q2H PRN Sreenath, Sudheer B, MD      . amiodarone (PACERONE) tablet 100 mg  100 mg Oral Q0600 Ralene Muskrat B, MD   100 mg at 12/19/20 0557  . atorvastatin (LIPITOR) tablet 80 mg  80 mg Oral QPM Ralene Muskrat B, MD   80 mg at 12/18/20 1714  . carbidopa-levodopa (SINEMET IR) 25-100 MG per tablet immediate release 1 tablet  1 tablet Oral TID Ralene Muskrat B, MD   1 tablet at 12/19/20 1107  . Chlorhexidine Gluconate Cloth 2 % PADS 6 each  6 each Topical Daily Ralene Muskrat B, MD   6 each at 12/18/20 2200  . divalproex (DEPAKOTE) DR tablet 250 mg  250  mg Oral TID Ralene Muskrat B, MD   250 mg at 12/19/20 1109  . docusate sodium (COLACE) capsule 100 mg  100 mg Oral BID Ralene Muskrat B, MD   100 mg at 12/19/20 1105  . donepezil (ARICEPT) tablet 10 mg  10 mg Oral QHS Ralene Muskrat B, MD   10 mg at 12/18/20 2239  . famotidine (PEPCID) tablet 20 mg  20 mg Oral Daily Oswald Hillock, RPH   20 mg at 12/19/20 1106  . furosemide (LASIX) tablet 40 mg  40 mg Oral BID Sharion Settler, NP   40 mg at 12/19/20 1121  . hydrALAZINE (APRESOLINE) injection 10 mg  10 mg Intravenous Q4H PRN Ralene Muskrat  B, MD      . MEDLINE mouth rinse  15 mL Mouth Rinse BID Priscella Mann, Sudheer B, MD   15 mL at 12/18/20 2200  . memantine (NAMENDA) tablet 10 mg  10 mg Oral BID Ralene Muskrat B, MD   10 mg at 12/19/20 1106  . [START ON 12/20/2020] metoprolol succinate (TOPROL-XL) 24 hr tablet 12.5 mg  12.5 mg Oral Daily Jennye Boroughs, MD      . mirtazapine (REMERON) tablet 15 mg  15 mg Oral QHS Ralene Muskrat B, MD   15 mg at 12/18/20 2239  . multivitamin-lutein (OCUVITE-LUTEIN) capsule 1 capsule  1 capsule Oral Daily Ralene Muskrat B, MD   1 capsule at 12/19/20 1107  . ondansetron (ZOFRAN) tablet 4 mg  4 mg Oral Q6H PRN Ralene Muskrat B, MD       Or  . ondansetron (ZOFRAN) injection 4 mg  4 mg Intravenous Q6H PRN Sreenath, Sudheer B, MD      . oxyCODONE (Oxy IR/ROXICODONE) immediate release tablet 5 mg  5 mg Oral Q4H PRN Ralene Muskrat B, MD   5 mg at 12/16/20 1925     Discharge Medications: Please see discharge summary for a list of discharge medications.  Relevant Imaging Results:  Relevant Lab Results:   Additional Information SS# [id number]  Kerin Salen, RN

## 2020-12-19 NOTE — TOC Progression Note (Addendum)
Transition of Care [facility]) - Progression Note    Patient Details  Name: Eric Hendricks MRN: [id number] Date of Birth: January 04, 1924  [facility]) CM/SW Contact  Anselm Pancoast, RN Phone Number: 12/19/2020, 3:12 PM  Clinical Narrative:    Spoke to Earlyne Iba [phone] regarding [name] refusing to accept patient back until Monday. Izora Gala states they do not accept patients after 2pm or on weekends and she came to the hospital to assess patient and no discharge order had been written. Izora Gala agreed to review this patient if no new medications or orders patient can transfer to [facility] as they would not be able to get any new medications until 3am if needing new meds. Izora Gala requested H/P, DC summary and medication list be sent via secure email to [email or web address].   Spoke to Dr. Mal Misty who is waiting for results of ultrasound prior to discharge. Confirmed with RN patient has not had ultrasound at this time but is headed down.   Anticipate patient will remain in hospital over the weekend and discharge to [facility] on Monday.           Expected Discharge Plan and Services           Expected Discharge Date: 12/19/20                                     Social Determinants of Health (SDOH) Interventions    Readmission Risk Interventions No flowsheet data found.

## 2020-12-19 NOTE — Care Management Important Message (Signed)
Important Message  Patient Details  Name: Dade Rodin MRN: [id number] Date of Birth: 09-04-1924   Medicare Important Message Given:  Yes     Dannette Barbara 12/19/2020, 1:54 PM

## 2020-12-19 NOTE — Progress Notes (Signed)
   12/19/20 1058  Assess: MEWS Score  Temp (!) 97.4 F (36.3 C)  BP (!) 98/51  Pulse Rate 69  Resp (!) 36  Level of Consciousness Alert  SpO2 97 %  O2 Device Nasal Cannula  O2 Flow Rate (L/min) 3 L/min  Assess: MEWS Score  MEWS Temp 0  MEWS Systolic 1  MEWS Pulse 0  MEWS RR 3  MEWS LOC 0  MEWS Score 4  MEWS Score Color Red  Assess: if the MEWS score is Yellow or Red  Were vital signs taken at a resting state? Yes  Focused Assessment Change from prior assessment (see assessment flowsheet)  Early Detection of Sepsis Score *See Row Information* Low  MEWS guidelines implemented *See Row Information* Yes  Treat  MEWS Interventions Administered scheduled meds/treatments;Escalated (See documentation below)  Pain Scale 0-10  Pain Score 0  Take Vital Signs  Increase Vital Sign Frequency  Red: Q 1hr X 4 then Q 4hr X 4, if remains red, continue Q 4hrs  Escalate  MEWS: Escalate Red: discuss with charge nurse/RN and provider, consider discussing with RRT  Notify: Charge Nurse/RN  Name of Charge Nurse/RN Notified Linard Millers RN  Date Charge Nurse/RN Notified 12/19/20  Time Charge Nurse/RN Notified 1103  Notify: Provider  Provider Name/Title Dr Mal Misty  Date Provider Notified 12/19/20  Time Provider Notified 1103  Notification Type Page (face-to-face)  Notification Reason Change in status  Provider response See new orders  Date of Provider Response 12/19/20  Time of Provider Response 1119  Notify: Rapid Response  Name of Rapid Response RN Notified Sarah RN  Date Rapid Response Notified 12/19/20  Time Rapid Response Notified 1104  Document  Patient Outcome Stabilized after interventions   Pt resting in bed, increased RR. MD paged and RR nurse paged. Charge RN made aware face-to-face. See MAR. See new orders

## 2020-12-19 NOTE — TOC Transition Note (Signed)
Transition of Care [facility]) - CM/SW Discharge Note   Patient Details  Name: Eric Hendricks MRN: [id number] Date of Birth: 01-02-1924  [facility] of [facility]) CM/SW Contact:  Kerin Salen, RN Phone Number: 12/19/2020, 5:01 PM   Final next level of care: Assisted Living Barriers to Discharge: Barriers Resolved   Patient Goals and CMS Choice Patient states their goals for this hospitalization and ongoing recovery are:: To return to [facility]   Choice offered to / list presented to : NA  Discharge Placement                  Name of family member notified: Daughter, Brantley Persons Patient and family notified of of transfer: 12/19/20  Discharge Plan and Services                DME Arranged: N/A DME Agency: NA       HH Arranged: NA HH Agency: NA        Social Determinants of Health (SDOH) Interventions     Readmission Risk Interventions No flowsheet data found.

## 2020-12-20 LAB — BODY FLUID CULTURE W GRAM STAIN: Culture: NO GROWTH

## 2020-12-30 ENCOUNTER — Encounter: Payer: Medicare Other | Admitting: Thoracic Surgery (Cardiothoracic Vascular Surgery)

## 2021-01-13 ENCOUNTER — Inpatient Hospital Stay
Admission: EM | Admit: 2021-01-13 | Discharge: 2021-01-21 | DRG: 291 | Disposition: A | Discharge status: Skilled Nursing Facility | Payer: Medicare Other | Attending: Internal Medicine | Admitting: Internal Medicine

## 2021-01-13 ENCOUNTER — Emergency Department: Payer: Medicare Other

## 2021-01-13 DIAGNOSIS — W19XXXA Unspecified fall, initial encounter: Secondary | ICD-10-CM

## 2021-01-13 DIAGNOSIS — I130 Hypertensive heart and chronic kidney disease with heart failure and stage 1 through stage 4 chronic kidney disease, or unspecified chronic kidney disease: Secondary | ICD-10-CM | POA: Diagnosis present

## 2021-01-13 DIAGNOSIS — I5021 Acute systolic (congestive) heart failure: Secondary | ICD-10-CM | POA: Diagnosis not present

## 2021-01-13 DIAGNOSIS — G20 Parkinson's disease: Secondary | ICD-10-CM | POA: Diagnosis present

## 2021-01-13 DIAGNOSIS — Z9981 Dependence on supplemental oxygen: Secondary | ICD-10-CM | POA: Diagnosis not present

## 2021-01-13 DIAGNOSIS — F32A Depression, unspecified: Secondary | ICD-10-CM | POA: Diagnosis present

## 2021-01-13 DIAGNOSIS — Z79899 Other long term (current) drug therapy: Secondary | ICD-10-CM | POA: Diagnosis not present

## 2021-01-13 DIAGNOSIS — I509 Heart failure, unspecified: Secondary | ICD-10-CM | POA: Diagnosis not present

## 2021-01-13 DIAGNOSIS — F0280 Dementia in other diseases classified elsewhere without behavioral disturbance: Secondary | ICD-10-CM | POA: Diagnosis present

## 2021-01-13 DIAGNOSIS — Z825 Family history of asthma and other chronic lower respiratory diseases: Secondary | ICD-10-CM

## 2021-01-13 DIAGNOSIS — J1282 Pneumonia due to coronavirus disease 2019: Secondary | ICD-10-CM | POA: Diagnosis present

## 2021-01-13 DIAGNOSIS — Z888 Allergy status to other drugs, medicaments and biological substances status: Secondary | ICD-10-CM

## 2021-01-13 DIAGNOSIS — J9601 Acute respiratory failure with hypoxia: Secondary | ICD-10-CM

## 2021-01-13 DIAGNOSIS — J9621 Acute and chronic respiratory failure with hypoxia: Secondary | ICD-10-CM | POA: Diagnosis present

## 2021-01-13 DIAGNOSIS — F0281 Dementia in other diseases classified elsewhere with behavioral disturbance: Secondary | ICD-10-CM | POA: Diagnosis not present

## 2021-01-13 DIAGNOSIS — Z7982 Long term (current) use of aspirin: Secondary | ICD-10-CM

## 2021-01-13 DIAGNOSIS — H9193 Unspecified hearing loss, bilateral: Secondary | ICD-10-CM | POA: Diagnosis present

## 2021-01-13 DIAGNOSIS — Z23 Encounter for immunization: Secondary | ICD-10-CM

## 2021-01-13 DIAGNOSIS — Z515 Encounter for palliative care: Secondary | ICD-10-CM | POA: Diagnosis not present

## 2021-01-13 DIAGNOSIS — N1831 Chronic kidney disease, stage 3a: Secondary | ICD-10-CM | POA: Diagnosis present

## 2021-01-13 DIAGNOSIS — J440 Chronic obstructive pulmonary disease with acute lower respiratory infection: Secondary | ICD-10-CM | POA: Diagnosis present

## 2021-01-13 DIAGNOSIS — Z66 Do not resuscitate: Secondary | ICD-10-CM | POA: Diagnosis present

## 2021-01-13 DIAGNOSIS — Z8249 Family history of ischemic heart disease and other diseases of the circulatory system: Secondary | ICD-10-CM | POA: Diagnosis not present

## 2021-01-13 DIAGNOSIS — D631 Anemia in chronic kidney disease: Secondary | ICD-10-CM | POA: Diagnosis present

## 2021-01-13 DIAGNOSIS — S0081XA Abrasion of other part of head, initial encounter: Secondary | ICD-10-CM | POA: Diagnosis present

## 2021-01-13 DIAGNOSIS — S40212A Abrasion of left shoulder, initial encounter: Secondary | ICD-10-CM | POA: Diagnosis present

## 2021-01-13 DIAGNOSIS — Z8042 Family history of malignant neoplasm of prostate: Secondary | ICD-10-CM | POA: Diagnosis not present

## 2021-01-13 DIAGNOSIS — I2510 Atherosclerotic heart disease of native coronary artery without angina pectoris: Secondary | ICD-10-CM | POA: Diagnosis present

## 2021-01-13 DIAGNOSIS — W06XXXA Fall from bed, initial encounter: Secondary | ICD-10-CM | POA: Diagnosis present

## 2021-01-13 DIAGNOSIS — J90 Pleural effusion, not elsewhere classified: Secondary | ICD-10-CM | POA: Diagnosis not present

## 2021-01-13 DIAGNOSIS — U071 COVID-19: Secondary | ICD-10-CM | POA: Diagnosis present

## 2021-01-13 DIAGNOSIS — Z8546 Personal history of malignant neoplasm of prostate: Secondary | ICD-10-CM | POA: Diagnosis not present

## 2021-01-13 DIAGNOSIS — Y92003 Bedroom of unspecified non-institutional (private) residence as the place of occurrence of the external cause: Secondary | ICD-10-CM | POA: Diagnosis not present

## 2021-01-13 DIAGNOSIS — E785 Hyperlipidemia, unspecified: Secondary | ICD-10-CM | POA: Diagnosis present

## 2021-01-13 DIAGNOSIS — I5023 Acute on chronic systolic (congestive) heart failure: Secondary | ICD-10-CM | POA: Diagnosis present

## 2021-01-13 DIAGNOSIS — I480 Paroxysmal atrial fibrillation: Secondary | ICD-10-CM | POA: Diagnosis present

## 2021-01-13 DIAGNOSIS — Z820 Family history of epilepsy and other diseases of the nervous system: Secondary | ICD-10-CM

## 2021-01-13 DIAGNOSIS — Z886 Allergy status to analgesic agent status: Secondary | ICD-10-CM

## 2021-01-13 DIAGNOSIS — F0390 Unspecified dementia without behavioral disturbance: Secondary | ICD-10-CM | POA: Diagnosis not present

## 2021-01-13 DIAGNOSIS — R946 Abnormal results of thyroid function studies: Secondary | ICD-10-CM | POA: Diagnosis present

## 2021-01-13 DIAGNOSIS — Z7189 Other specified counseling: Secondary | ICD-10-CM | POA: Diagnosis not present

## 2021-01-13 DIAGNOSIS — I447 Left bundle-branch block, unspecified: Secondary | ICD-10-CM | POA: Diagnosis present

## 2021-01-13 LAB — CBC WITH DIFFERENTIAL/PLATELET
Abs Immature Granulocytes: 0.03 10*3/uL (ref 0.00–0.07)
Basophils Absolute: 0 10*3/uL (ref 0.0–0.1)
Basophils Relative: 0 %
Eosinophils Absolute: 0 10*3/uL (ref 0.0–0.5)
Eosinophils Relative: 0 %
HCT: 36.1 % — ABNORMAL LOW (ref 39.0–52.0)
Hemoglobin: 11.2 g/dL — ABNORMAL LOW (ref 13.0–17.0)
Immature Granulocytes: 0 %
Lymphocytes Relative: 3 %
Lymphs Abs: 0.2 10*3/uL — ABNORMAL LOW (ref 0.7–4.0)
MCH: 30.2 pg (ref 26.0–34.0)
MCHC: 31 g/dL (ref 30.0–36.0)
MCV: 97.3 fL (ref 80.0–100.0)
Monocytes Absolute: 0.5 10*3/uL (ref 0.1–1.0)
Monocytes Relative: 7 %
Neutro Abs: 6.1 10*3/uL (ref 1.7–7.7)
Neutrophils Relative %: 90 %
Platelets: 220 10*3/uL (ref 150–400)
RBC: 3.71 MIL/uL — ABNORMAL LOW (ref 4.22–5.81)
RDW: 18.6 % — ABNORMAL HIGH (ref 11.5–15.5)
WBC: 6.8 10*3/uL (ref 4.0–10.5)
nRBC: 0 % (ref 0.0–0.2)

## 2021-01-13 LAB — COMPREHENSIVE METABOLIC PANEL
ALT: 6 U/L (ref 0–44)
AST: 26 U/L (ref 15–41)
Albumin: 3.1 g/dL — ABNORMAL LOW (ref 3.5–5.0)
Alkaline Phosphatase: 77 U/L (ref 38–126)
Anion gap: 12 (ref 5–15)
BUN: 44 mg/dL — ABNORMAL HIGH (ref 8–23)
CO2: 26 mmol/L (ref 22–32)
Calcium: 8.3 mg/dL — ABNORMAL LOW (ref 8.9–10.3)
Chloride: 101 mmol/L (ref 98–111)
Creatinine, Ser: 1.57 mg/dL — ABNORMAL HIGH (ref 0.61–1.24)
GFR, Estimated: 41 mL/min — ABNORMAL LOW (ref >60)
Glucose, Bld: 115 mg/dL — ABNORMAL HIGH (ref 70–99)
Potassium: 4 mmol/L (ref 3.5–5.1)
Sodium: 139 mmol/L (ref 135–145)
Total Bilirubin: 0.7 mg/dL (ref 0.3–1.2)
Total Protein: 7.2 g/dL (ref 6.5–8.1)

## 2021-01-13 LAB — BRAIN NATRIURETIC PEPTIDE: B Natriuretic Peptide: 582.1 pg/mL — ABNORMAL HIGH (ref 0.0–100.0)

## 2021-01-13 MED ORDER — BACITRACIN ZINC 500 UNIT/GM EX OINT
TOPICAL_OINTMENT | Freq: Two times a day (BID) | CUTANEOUS | Status: DC
  Filled 2021-01-13 (×12): qty 0.9

## 2021-01-13 MED ORDER — FUROSEMIDE 10 MG/ML IJ SOLN
40.0000 mg | Freq: Once | INTRAMUSCULAR | Status: AC
  Administered 2021-01-13: 40 mg via INTRAVENOUS
  Filled 2021-01-13: qty 4

## 2021-01-13 MED ORDER — TETANUS-DIPHTH-ACELL PERTUSSIS 5-2.5-18.5 LF-MCG/0.5 IM SUSY
0.5000 mL | PREFILLED_SYRINGE | Freq: Once | INTRAMUSCULAR | Status: AC
  Administered 2021-01-14: 0.5 mL via INTRAMUSCULAR
  Filled 2021-01-13: qty 0.5

## 2021-01-13 NOTE — ED Provider Notes (Signed)
[facility] Emergency Department Provider Note  ____________________________________________   Event Date/Time   First MD Initiated Contact with Patient 01/13/21 2030     (approximate)  I have reviewed the triage vital signs and the nursing notes.   HISTORY  Chief Complaint Fall (Pt coming from [facility] with complaints of being found on floor. Pt reports he "rolled out of bed". Pt with abrasion to left forehead. Pt alert. Denies other complaints )    HPI Eric Hendricks is a 87 y.o. male  Here with fall from bed. States he was dreaming this afternoon when he rolled, landing onto the back of his head. Immediately woke up. Has aching,t throbbing pain here. No vision changes. Has a superficial abrasion to his forehead but believes he mostly just hit his head. Small abrasion to left shoulder as well. He is not on blood thinners. No numbness, weakness.   Remainder of history limited 2/2 dementia.       Past Medical History:  Diagnosis Date  . Arrhythmia    atrial fibrillation  . B12 deficiency   . CHF (congestive heart failure) ([place])   . Dementia ([place])   . Depression   . Hyperlipemia   . Parkinson's disease ([place])   . Prostate cancer [facility])     Patient Active Problem List   Diagnosis Date Noted  . Acute CHF (congestive heart failure) ([place]) 01/13/2021  . S/P thoracentesis   . Pleural effusion due to CHF (congestive heart failure) ([place]) 12/15/2020  . Acute respiratory failure with hypoxia ([place]) 05/10/2020  . Acute on chronic systolic CHF (congestive heart failure) ([place]) 05/10/2020  . Pleural effusion, right 05/09/2020  . Acute on chronic systolic heart failure ([place]) 11/21/2019  . AF (paroxysmal atrial fibrillation) ([place]) 11/21/2019  . Depression 11/21/2019  . Dementia due to Parkinson's disease without behavioral disturbance ([place]) 11/21/2019    Past Surgical History:  Procedure Laterality Date  . ANKLE SURGERY Right   . APPENDECTOMY    .  CATARACT EXTRACTION Bilateral   . TRANSURETHRAL RESECTION OF PROSTATE      Prior to Admission medications   Medication Sig Start Date End Date Taking? Authorizing Provider  acetaminophen (TYLENOL) 325 MG tablet Take 650 mg by mouth 2 (two) times daily.    [provider]  acetaminophen (TYLENOL) 500 MG tablet Take 500 mg by mouth daily as needed for mild pain.    [provider]  amiodarone (PACERONE) 200 MG tablet Take 100 mg by mouth daily. 11/03/19   [provider]  ascorbic acid (VITAMIN C) 500 MG tablet Take 500 mg by mouth daily.    [provider]  aspirin EC 81 MG tablet Take 81 mg by mouth daily.    [provider]  atorvastatin (LIPITOR) 80 MG tablet Take 80 mg by mouth every evening. 11/03/19   [provider]  carbidopa-levodopa (SINEMET IR) 25-100 MG tablet Take 1 tablet by mouth 3 (three) times daily. 1 tablet at 9am/1pm/5pm.  Please give meds 20 min prior to meal or 1 hour after the meal 08/29/20   Tat, Rebecca S, DO  divalproex (DEPAKOTE) 250 MG DR tablet Take 1 tablet (250 mg total) by mouth 3 (three) times daily. 05/11/20   Jennye Boroughs, MD  docusate sodium (COLACE) 100 MG capsule Take 100 mg by mouth 2 (two) times daily as needed for mild constipation or moderate constipation.    [provider]  donepezil (ARICEPT) 10 MG tablet TAKE 1 TABLET BY  MOUTH AT BEDTIME Patient taking differently: Take 10 mg by mouth at bedtime. 05/02/19   Tat, Eustace Quail, DO  famotidine (PEPCID) 20 MG tablet Take 20 mg by mouth 2 (two) times daily.    [provider]  furosemide (LASIX) 40 MG tablet Take 1 tablet (40 mg total) by mouth 2 (two) times daily. 11/23/19   Dhungel, Nishant, MD  memantine (NAMENDA) 10 MG tablet TAKE 1 TABLET BY MOUTH TWICE A DAY Patient taking differently: Take 10 mg by mouth 2 (two) times daily. 05/02/19   Tat, Eustace Quail, DO  metoprolol succinate (TOPROL-XL) 25 MG 24 hr tablet Take 0.5 tablets (12.5 mg  total) by mouth daily. 12/19/20   Jennye Boroughs, MD  mirtazapine (REMERON) 15 MG tablet Take 15 mg by mouth at bedtime.    [provider]  Polyethyl Glycol-Propyl Glycol ([employer] LUBRICANT EYE DROPS) 0.4-0.3 % SOLN Place 2 drops into both eyes every 4 (four) hours as needed (dry eyes).    [provider]    Allergies Phenylephrine-guaifenesin, Aspirin, Guaifenesin, Guanfacine, Phenylephrine, and Suprax [cefixime]  Family History  Problem Relation Age of Onset  . Colon cancer Father   . Tremor Mother   . Parkinson's disease Brother   . Prostate cancer Brother   . Lung cancer Sister   . Emphysema Sister   . Heart failure Child   . Diabetes Child   . AAA (abdominal aortic aneurysm) Child     Social History Social History   Tobacco Use  . Smoking status: Never Smoker  . Smokeless tobacco: Never Used  Vaping Use  . Vaping Use: Never used  Substance Use Topics  . Alcohol use: No    Alcohol/week: 0.0 standard drinks  . Drug use: No    Review of Systems  Review of Systems  Unable to perform ROS: Dementia     ____________________________________________  PHYSICAL EXAM:      VITAL SIGNS: ED Triage Vitals  Enc Vitals Group     BP 01/13/21 2028 128/79     Pulse Rate 01/13/21 2028 (!) 117     Resp 01/13/21 2028 20     Temp 01/13/21 2028 97.8 F (36.6 C)     Temp Source 01/13/21 2028 Oral     SpO2 01/13/21 2027 94 %     Weight --      Height --      Head Circumference --      Peak Flow --      Pain Score 01/13/21 2029 3     Pain Loc --      Pain Edu? --      Excl. in [place]? --      Physical Exam Vitals and nursing note reviewed.  Constitutional:      General: He is not in acute distress.    Appearance: He is well-developed.  HENT:     Head: Normocephalic and atraumatic.  Eyes:     Conjunctiva/sclera: Conjunctivae normal.  Cardiovascular:     Rate and Rhythm: Regular rhythm. Tachycardia present.     Heart sounds: Normal heart sounds.   Pulmonary:     Effort: Pulmonary effort is normal. No respiratory distress.     Breath sounds: Rales (bibasilar) present. No wheezing.  Abdominal:     General: There is no distension.  Musculoskeletal:     Cervical back: Neck supple.     Right lower leg: Edema present.     Left lower leg: Edema present.  Skin:  General: Skin is warm.     Capillary Refill: Capillary refill takes less than 2 seconds.     Findings: No rash.  Neurological:     Mental Status: He is alert and oriented to person, place, and time.     Motor: No abnormal muscle tone.       ____________________________________________   LABS (all labs ordered are listed, but only abnormal results are displayed)  Labs Reviewed  CBC WITH DIFFERENTIAL/PLATELET - Abnormal; Notable for the following components:      Result Value   RBC 3.71 (*)    Hemoglobin 11.2 (*)    HCT 36.1 (*)    RDW 18.6 (*)    Lymphs Abs 0.2 (*)    All other components within normal limits  COMPREHENSIVE METABOLIC PANEL - Abnormal; Notable for the following components:   Glucose, Bld 115 (*)    BUN 44 (*)    Creatinine, Ser 1.57 (*)    Calcium 8.3 (*)    Albumin 3.1 (*)    GFR, Estimated 41 (*)    All other components within normal limits  BRAIN NATRIURETIC PEPTIDE - Abnormal; Notable for the following components:   B Natriuretic Peptide 582.1 (*)    All other components within normal limits  RESP PANEL BY RT-PCR (FLU A&B, COVID) ARPGX2    ____________________________________________  EKG: Atrial fibrillation, ventricular rate 109.  PR unable to calculate, QRS 148, QTc 534.  Nonspecific T wave changes.  No acute ST elevations or depressions. ________________________________________  RADIOLOGY All imaging, including plain films, CT scans, and ultrasounds, independently reviewed by me, and interpretations confirmed via formal radiology reads.  ED MD interpretation:   Chest x-ray: Progression of bilateral edema and right pleural  effusion recurrence CT head: No acute intracranial abnormality CT spine cervical: Negative  Official radiology report(s): CT Head Wo Contrast  Result Date: 01/13/2021 CLINICAL DATA:  [place] with bruises to the forehead.  Neck trauma. EXAM: CT HEAD WITHOUT CONTRAST CT CERVICAL SPINE WITHOUT CONTRAST TECHNIQUE: Multidetector CT imaging of the head and cervical spine was performed following the standard protocol without intravenous contrast. Multiplanar CT image reconstructions of the cervical spine were also generated. COMPARISON:  CT head and cervical spine 07/31/2018 FINDINGS: CT HEAD FINDINGS Brain: No evidence of acute infarction, hemorrhage, hydrocephalus, extra-axial collection or mass lesion/mass effect. Diffuse cerebral atrophy. Ventricular dilatation likely due to central atrophy. Patchy low-attenuation changes in the deep white matter consistent small vessel ischemia. Vascular: Moderate intracranial arterial vascular calcifications. Skull: Calvarium appears intact. Sinuses/Orbits: Paranasal sinuses and mastoid air cells are clear. Other: None. CT CERVICAL SPINE FINDINGS Alignment: Normal alignment Skull base and vertebrae: No acute fracture. No primary bone lesion or focal pathologic process. Soft tissues and spinal canal: No prevertebral fluid or swelling. No visible canal hematoma. Disc levels: Degenerative changes with narrowed interspaces and endplate hypertrophic change most prominent at C5-6 and C6-7 levels. Prominent ligamentous calcifications. Degenerative changes in the facet joints. Upper chest: Bilateral pleural effusions are demonstrated in the apices mid Other: Spur calcifications IMPRESSION: 1. No acute intracranial abnormalities. Chronic atrophy and small vessel ischemic changes. 2. Normal alignment of the cervical spine. Degenerative changes. No acute displaced fractures identified. 3. Bilateral pleural effusions. Electronically Signed   By: Lucienne Capers M.D.   On: 01/13/2021 22:06    CT Cervical Spine Wo Contrast  Result Date: 01/13/2021 CLINICAL DATA:  [place] with bruises to the forehead.  Neck trauma. EXAM: CT HEAD WITHOUT CONTRAST CT CERVICAL SPINE WITHOUT CONTRAST TECHNIQUE: Multidetector  CT imaging of the head and cervical spine was performed following the standard protocol without intravenous contrast. Multiplanar CT image reconstructions of the cervical spine were also generated. COMPARISON:  CT head and cervical spine 07/31/2018 FINDINGS: CT HEAD FINDINGS Brain: No evidence of acute infarction, hemorrhage, hydrocephalus, extra-axial collection or mass lesion/mass effect. Diffuse cerebral atrophy. Ventricular dilatation likely due to central atrophy. Patchy low-attenuation changes in the deep white matter consistent small vessel ischemia. Vascular: Moderate intracranial arterial vascular calcifications. Skull: Calvarium appears intact. Sinuses/Orbits: Paranasal sinuses and mastoid air cells are clear. Other: None. CT CERVICAL SPINE FINDINGS Alignment: Normal alignment Skull base and vertebrae: No acute fracture. No primary bone lesion or focal pathologic process. Soft tissues and spinal canal: No prevertebral fluid or swelling. No visible canal hematoma. Disc levels: Degenerative changes with narrowed interspaces and endplate hypertrophic change most prominent at C5-6 and C6-7 levels. Prominent ligamentous calcifications. Degenerative changes in the facet joints. Upper chest: Bilateral pleural effusions are demonstrated in the apices mid Other: Spur calcifications IMPRESSION: 1. No acute intracranial abnormalities. Chronic atrophy and small vessel ischemic changes. 2. Normal alignment of the cervical spine. Degenerative changes. No acute displaced fractures identified. 3. Bilateral pleural effusions. Electronically Signed   By: Lucienne Capers M.D.   On: 01/13/2021 22:06   DG Chest Port 1 View  Result Date: 01/13/2021 CLINICAL DATA:  Cough and shortness of breath.  Weakness and  fall. EXAM: PORTABLE CHEST 1 VIEW COMPARISON:  12/19/2020 FINDINGS: Cardiac pacemaker. Shallow inspiration. Heart size is obscured but appears enlarged. Bilateral perihilar infiltration/edema. Moderate right pleural effusion. Progression of changes since previous study. Calcification of the aorta. IMPRESSION: Progression of bilateral perihilar infiltration/edema and right pleural effusion. Electronically Signed   By: Lucienne Capers M.D.   On: 01/13/2021 21:28    ____________________________________________  PROCEDURES   Procedure(s) performed (including Critical Care):  .1-3 Lead EKG Interpretation Performed by: Duffy Bruce, MD Authorized by: Duffy Bruce, MD     Interpretation: non-specific     ECG rate:  100-120   ECG rate assessment: tachycardic     Rhythm: sinus rhythm     Ectopy: none     Conduction: normal   Comments:     Indication: Shortness of breath    ____________________________________________  INITIAL IMPRESSION / MDM / ASSESSMENT AND PLAN / ED COURSE  As part of my medical decision making, I reviewed the following data within the [place] notes reviewed and incorporated, Old chart reviewed, Notes from prior ED visits, and [place] Controlled Substance Database       *Chinedu Agustin was evaluated in Emergency Department on 01/13/2021 for the symptoms described in the history of present illness. He was evaluated in the context of the global COVID-19 pandemic, which necessitated consideration that the patient might be at risk for infection with the SARS-CoV-2 virus that causes COVID-19. Institutional protocols and algorithms that pertain to the evaluation of patients at risk for COVID-19 are in a state of rapid change based on information released by regulatory bodies including the CDC and federal and state organizations. These policies and algorithms were followed during the patient's care in the ED.  Some ED evaluations and interventions may  be delayed as a result of limited staffing during the pandemic.*    Medical Decision Making: 87 year old male here with mechanical fall.  Regarding the fall, CT head and C-spine negative for any signs of acute traumatic abnormality.  No signs of rib fracture on chest x-ray.  No other areas  of tenderness on full secondary exam.  While here, patient noted to be tachycardic, tachypneic, and increased on his O2 requirement from 3 to 4 L.  Chest x-ray does show recurrent effusions as well as pulmonary edema.  BNP remains elevated.  Lab work is otherwise at his baseline.  Will plan to diurese and admit.  Patient has a history of recurrent CHF as well as prior thoracentesis for his right pleural effusion.  ____________________________________________  FINAL CLINICAL IMPRESSION(S) / ED DIAGNOSES  Final diagnoses:  Paroxysmal atrial fibrillation (HCC)  Acute systolic congestive heart failure (HCC)     MEDICATIONS GIVEN DURING THIS VISIT:  Medications  Tdap (BOOSTRIX) injection 0.5 mL (has no administration in time range)  bacitracin ointment (has no administration in time range)  furosemide (LASIX) injection 40 mg (40 mg Intravenous Given 01/13/21 2313)     ED Discharge Orders    None       Note:  This document was prepared using Dragon voice recognition software and may include unintentional dictation errors.   Duffy Bruce, MD 01/13/21 [phone]

## 2021-01-14 ENCOUNTER — Other Ambulatory Visit: Payer: Self-pay

## 2021-01-14 ENCOUNTER — Encounter: Payer: Self-pay | Admitting: Family Medicine

## 2021-01-14 DIAGNOSIS — I5023 Acute on chronic systolic (congestive) heart failure: Secondary | ICD-10-CM | POA: Diagnosis not present

## 2021-01-14 DIAGNOSIS — I480 Paroxysmal atrial fibrillation: Secondary | ICD-10-CM | POA: Diagnosis not present

## 2021-01-14 DIAGNOSIS — E785 Hyperlipidemia, unspecified: Secondary | ICD-10-CM | POA: Diagnosis not present

## 2021-01-14 DIAGNOSIS — G20 Parkinson's disease: Secondary | ICD-10-CM | POA: Diagnosis not present

## 2021-01-14 DIAGNOSIS — I5021 Acute systolic (congestive) heart failure: Secondary | ICD-10-CM

## 2021-01-14 DIAGNOSIS — F0281 Dementia in other diseases classified elsewhere with behavioral disturbance: Secondary | ICD-10-CM

## 2021-01-14 LAB — BASIC METABOLIC PANEL
Anion gap: 10 (ref 5–15)
BUN: 42 mg/dL — ABNORMAL HIGH (ref 8–23)
CO2: 29 mmol/L (ref 22–32)
Calcium: 8 mg/dL — ABNORMAL LOW (ref 8.9–10.3)
Chloride: 101 mmol/L (ref 98–111)
Creatinine, Ser: 1.43 mg/dL — ABNORMAL HIGH (ref 0.61–1.24)
GFR, Estimated: 45 mL/min — ABNORMAL LOW (ref >60)
Glucose, Bld: 104 mg/dL — ABNORMAL HIGH (ref 70–99)
Potassium: 3.7 mmol/L (ref 3.5–5.1)
Sodium: 140 mmol/L (ref 135–145)

## 2021-01-14 LAB — CBC
HCT: 32.5 % — ABNORMAL LOW (ref 39.0–52.0)
Hemoglobin: 10.1 g/dL — ABNORMAL LOW (ref 13.0–17.0)
MCH: 30 pg (ref 26.0–34.0)
MCHC: 31.1 g/dL (ref 30.0–36.0)
MCV: 96.4 fL (ref 80.0–100.0)
Platelets: 192 10*3/uL (ref 150–400)
RBC: 3.37 MIL/uL — ABNORMAL LOW (ref 4.22–5.81)
RDW: 18.4 % — ABNORMAL HIGH (ref 11.5–15.5)
WBC: 5 10*3/uL (ref 4.0–10.5)
nRBC: 0 % (ref 0.0–0.2)

## 2021-01-14 LAB — PROCALCITONIN: Procalcitonin: <0.1 ng/mL

## 2021-01-14 LAB — TSH: TSH: 9.763 u[IU]/mL — ABNORMAL HIGH (ref 0.350–4.500)

## 2021-01-14 LAB — C-REACTIVE PROTEIN: CRP: 3.2 mg/dL — ABNORMAL HIGH (ref <1.0)

## 2021-01-14 LAB — RESP PANEL BY RT-PCR (FLU A&B, COVID) ARPGX2
Influenza A by PCR: NEGATIVE
Influenza B by PCR: NEGATIVE
SARS Coronavirus 2 by RT PCR: POSITIVE — AB

## 2021-01-14 LAB — D-DIMER, QUANTITATIVE: D-Dimer, Quant: 5.66 ug/mL-FEU — ABNORMAL HIGH (ref 0.00–0.50)

## 2021-01-14 LAB — FERRITIN: Ferritin: 156 ng/mL (ref 24–336)

## 2021-01-14 LAB — MAGNESIUM: Magnesium: 2.6 mg/dL — ABNORMAL HIGH (ref 1.7–2.4)

## 2021-01-14 MED ORDER — ATORVASTATIN CALCIUM 20 MG PO TABS
80.0000 mg | ORAL_TABLET | Freq: Every evening | ORAL | Status: DC
  Administered 2021-01-14 – 2021-01-18 (×5): 80 mg via ORAL
  Filled 2021-01-14 (×5): qty 4

## 2021-01-14 MED ORDER — ONDANSETRON HCL 4 MG PO TABS
4.0000 mg | ORAL_TABLET | Freq: Four times a day (QID) | ORAL | Status: DC | PRN
Start: 2021-01-14 — End: 2021-01-21

## 2021-01-14 MED ORDER — MEMANTINE HCL 5 MG PO TABS
10.0000 mg | ORAL_TABLET | Freq: Two times a day (BID) | ORAL | Status: DC
  Administered 2021-01-14 – 2021-01-21 (×16): 10 mg via ORAL
  Filled 2021-01-14 (×17): qty 2

## 2021-01-14 MED ORDER — FAMOTIDINE 20 MG PO TABS
20.0000 mg | ORAL_TABLET | Freq: Two times a day (BID) | ORAL | Status: DC
Start: 2021-01-14 — End: 2021-01-21
  Administered 2021-01-14 – 2021-01-21 (×16): 20 mg via ORAL
  Filled 2021-01-14 (×16): qty 1

## 2021-01-14 MED ORDER — DIVALPROEX SODIUM 250 MG PO DR TAB
250.0000 mg | DELAYED_RELEASE_TABLET | Freq: Three times a day (TID) | ORAL | Status: DC
  Administered 2021-01-14 – 2021-01-21 (×22): 250 mg via ORAL
  Filled 2021-01-14 (×24): qty 1

## 2021-01-14 MED ORDER — METOPROLOL SUCCINATE ER 25 MG PO TB24
12.5000 mg | ORAL_TABLET | Freq: Every day | ORAL | Status: DC
  Administered 2021-01-15 – 2021-01-21 (×7): 12.5 mg via ORAL
  Filled 2021-01-14 (×8): qty 1

## 2021-01-14 MED ORDER — ONDANSETRON HCL 4 MG/2ML IJ SOLN: 4.0000 mg | Freq: Four times a day (QID) | INTRAMUSCULAR | Status: DC | PRN

## 2021-01-14 MED ORDER — MAGNESIUM HYDROXIDE 400 MG/5ML PO SUSP: 30.0000 mL | Freq: Every day | ORAL | Status: DC | PRN

## 2021-01-14 MED ORDER — ENOXAPARIN SODIUM 30 MG/0.3ML ~~LOC~~ SOLN
30.0000 mg | SUBCUTANEOUS | Status: DC
  Administered 2021-01-14 – 2021-01-18 (×5): 30 mg via SUBCUTANEOUS
  Filled 2021-01-14 (×5): qty 0.3

## 2021-01-14 MED ORDER — MIRTAZAPINE 15 MG PO TABS
15.0000 mg | ORAL_TABLET | Freq: Every day | ORAL | Status: DC
  Administered 2021-01-14 – 2021-01-20 (×8): 15 mg via ORAL
  Filled 2021-01-14 (×8): qty 1

## 2021-01-14 MED ORDER — DOCUSATE SODIUM 100 MG PO CAPS: 100.0000 mg | ORAL_CAPSULE | Freq: Two times a day (BID) | ORAL | Status: DC | PRN

## 2021-01-14 MED ORDER — SODIUM CHLORIDE 0.9 % IV SOLN
100.0000 mg | Freq: Every day | INTRAVENOUS | Status: AC
  Administered 2021-01-15 – 2021-01-18 (×4): 100 mg via INTRAVENOUS
  Filled 2021-01-14 (×4): qty 100

## 2021-01-14 MED ORDER — ASPIRIN EC 81 MG PO TBEC: 81.0000 mg | DELAYED_RELEASE_TABLET | Freq: Every day | ORAL | Status: DC

## 2021-01-14 MED ORDER — AMIODARONE HCL 100 MG PO TABS
100.0000 mg | ORAL_TABLET | Freq: Every day | ORAL | Status: DC
  Administered 2021-01-14: 100 mg via ORAL
  Filled 2021-01-14: qty 1

## 2021-01-14 MED ORDER — TRAZODONE HCL 50 MG PO TABS
25.0000 mg | ORAL_TABLET | Freq: Every evening | ORAL | Status: DC | PRN
  Administered 2021-01-14 – 2021-01-20 (×4): 25 mg via ORAL
  Filled 2021-01-14 (×4): qty 1

## 2021-01-14 MED ORDER — FUROSEMIDE 10 MG/ML IJ SOLN
60.0000 mg | Freq: Two times a day (BID) | INTRAMUSCULAR | Status: DC
  Administered 2021-01-14 – 2021-01-19 (×11): 60 mg via INTRAVENOUS
  Filled 2021-01-14 (×11): qty 6

## 2021-01-14 MED ORDER — ASCORBIC ACID 500 MG PO TABS
500.0000 mg | ORAL_TABLET | Freq: Every day | ORAL | Status: DC
  Administered 2021-01-14 – 2021-01-19 (×6): 500 mg via ORAL
  Filled 2021-01-14 (×6): qty 1

## 2021-01-14 MED ORDER — ACETAMINOPHEN 650 MG RE SUPP: 650.0000 mg | Freq: Four times a day (QID) | RECTAL | Status: DC | PRN

## 2021-01-14 MED ORDER — SODIUM CHLORIDE 0.9 % IV SOLN
200.0000 mg | Freq: Once | INTRAVENOUS | Status: AC
  Administered 2021-01-14: 200 mg via INTRAVENOUS
  Filled 2021-01-14: qty 40

## 2021-01-14 MED ORDER — POLYVINYL ALCOHOL 1.4 % OP SOLN
2.0000 [drp] | OPHTHALMIC | Status: DC | PRN
  Filled 2021-01-14: qty 15

## 2021-01-14 MED ORDER — SACUBITRIL-VALSARTAN 24-26 MG PO TABS
1.0000 | ORAL_TABLET | Freq: Two times a day (BID) | ORAL | Status: DC
  Administered 2021-01-14: 1 via ORAL
  Filled 2021-01-14 (×2): qty 1

## 2021-01-14 MED ORDER — CARBIDOPA-LEVODOPA 25-100 MG PO TABS
1.0000 | ORAL_TABLET | Freq: Three times a day (TID) | ORAL | Status: DC
  Administered 2021-01-14 – 2021-01-21 (×22): 1 via ORAL
  Filled 2021-01-14 (×26): qty 1

## 2021-01-14 MED ORDER — ACETAMINOPHEN 325 MG PO TABS: 650.0000 mg | ORAL_TABLET | Freq: Four times a day (QID) | ORAL | Status: DC | PRN

## 2021-01-14 MED ORDER — DONEPEZIL HCL 5 MG PO TABS
10.0000 mg | ORAL_TABLET | Freq: Every day | ORAL | Status: DC
  Administered 2021-01-14 – 2021-01-20 (×8): 10 mg via ORAL
  Filled 2021-01-14 (×9): qty 2

## 2021-01-14 NOTE — Progress Notes (Signed)
Spoke with dr. Bonner Puna regarding patient blood pressure and scheduled medication. Per md okay to give amiodarone, hold metoprolol and entresto at this time

## 2021-01-14 NOTE — H&P (Addendum)
[place]   PATIENT NAME: Eric Hendricks    MR#:  [id number]  DATE OF BIRTH:  1924/07/26  DATE OF ADMISSION:  01/13/2021  PRIMARY CARE PHYSICIAN: [name], MD   Patient is coming from: [facility]  REQUESTING/REFERRING PHYSICIAN: Duffy Bruce, MD CHIEF COMPLAINT:   Chief Complaint  Patient presents with  . Fall    Pt coming from [facility] with complaints of being found on floor. Pt reports he "rolled out of bed". Pt with abrasion to left forehead. Pt alert. Denies other complaints     HISTORY OF PRESENT ILLNESS:  Eric Hendricks is a 87 y.o. Caucasian male with medical history significant for systolic CHF, atrial fibrillation, Parkinson's disease, dyslipidemia, dementia and depression, who presented to emergency room after having an accidental fall with subsequent left forehead abrasion.  The patient apparently rolled from his bed while asleep and immediately woke up.  He denied any presyncope or syncope.  No chest pain or palpitations.  He had a small abrasion to the left shoulder as well.  No paresthesias or focal muscle weakness.  No dysuria, oliguria or hematuria or flank pain reported.  Is a fairly poor historian due to his dementia.  Per the patient's son he has apparently been followed by hospice at his SNF.  ED Course: When he came to the ER heart rate was 115 with possibility of her percent on 4 L of O2 nasal cannula as his pulse oximetry was 88% on his baseline 3 L.  Vital signs were otherwise normal.  CMP was remarkable for a BUN of 44 and creatinine 1.57 compared to 36 and 1.42 on 12/18/2020.  BNP was 582.  CBC showed mild anemia close to baseline.  Respiratory panel is currently pending.  EKG as reviewed by me : Revealed atrial fibrillation with rapid ventricular sponsor of 109 with nonspecific intraventricular conduction delay with left axis deviation and T wave inversion laterally Imaging: Head and C-spine CT showed no acute intracranial  abnormalities and chronic small vessel ischemic disease with chronic atrophy and normal alignment of the C-spine with degenerative changes as well as bilateral pleural effusions CTA revealed large right pleural effusion with subtotal collapse of the right lung and small left pleural effusion, moderate coronary artery calcification, aortic atherosclerosis with no PE.  The patient was given 40 mg of IV Lasix, Tdap booster.  He will be admitted to a progressive unit bed for further evaluation and management. PAST MEDICAL HISTORY:   Past Medical History:  Diagnosis Date  . Arrhythmia    atrial fibrillation  . B12 deficiency   . CHF (congestive heart failure) ([place])   . Dementia ([place])   . Depression   . Hyperlipemia   . Parkinson's disease ([place])   . Prostate cancer ([place])     PAST SURGICAL HISTORY:   Past Surgical History:  Procedure Laterality Date  . ANKLE SURGERY Right   . APPENDECTOMY    . CATARACT EXTRACTION Bilateral   . TRANSURETHRAL RESECTION OF PROSTATE      SOCIAL HISTORY:   Social History   Tobacco Use  . Smoking status: Never Smoker  . Smokeless tobacco: Never Used  Substance Use Topics  . Alcohol use: No    Alcohol/week: 0.0 standard drinks    FAMILY HISTORY:   Family History  Problem Relation Age of Onset  . Colon cancer Father   . Tremor Mother   . Parkinson's disease Brother   . Prostate cancer Brother   .  Lung cancer Sister   . Emphysema Sister   . Heart failure Child   . Diabetes Child   . AAA (abdominal aortic aneurysm) Child     DRUG ALLERGIES:   Allergies  Allergen Reactions  . Phenylephrine-Guaifenesin Palpitations  . Aspirin Other (See Comments)  . Guaifenesin Other (See Comments)    Unknown reaction  . Guanfacine   . Phenylephrine   . Suprax [Cefixime]     REVIEW OF SYSTEMS:   ROS As per history of present illness. All pertinent systems were reviewed above. Constitutional, HEENT, cardiovascular, respiratory, GI, GU,  musculoskeletal, neuro, psychiatric, endocrine, integumentary and hematologic systems were reviewed and are otherwise negative/unremarkable except for positive findings mentioned above in the HPI.   MEDICATIONS AT HOME:   Prior to Admission medications   Medication Sig Start Date End Date Taking? Authorizing Provider  acetaminophen (TYLENOL) 325 MG tablet Take 650 mg by mouth 2 (two) times daily.    [provider]  acetaminophen (TYLENOL) 500 MG tablet Take 500 mg by mouth daily as needed for mild pain.    [provider]  amiodarone (PACERONE) 200 MG tablet Take 100 mg by mouth daily. 11/03/19   [provider]  ascorbic acid (VITAMIN C) 500 MG tablet Take 500 mg by mouth daily.    [provider]  aspirin EC 81 MG tablet Take 81 mg by mouth daily.    [provider]  atorvastatin (LIPITOR) 80 MG tablet Take 80 mg by mouth every evening. 11/03/19   [provider]  carbidopa-levodopa (SINEMET IR) 25-100 MG tablet Take 1 tablet by mouth 3 (three) times daily. 1 tablet at 9am/1pm/5pm.  Please give meds 20 min prior to meal or 1 hour after the meal 08/29/20   Tat, Rebecca S, DO  divalproex (DEPAKOTE) 250 MG DR tablet Take 1 tablet (250 mg total) by mouth 3 (three) times daily. 05/11/20   Jennye Boroughs, MD  docusate sodium (COLACE) 100 MG capsule Take 100 mg by mouth 2 (two) times daily as needed for mild constipation or moderate constipation.    [provider]  donepezil (ARICEPT) 10 MG tablet TAKE 1 TABLET BY MOUTH AT BEDTIME Patient taking differently: Take 10 mg by mouth at bedtime. 05/02/19   Tat, Eustace Quail, DO  famotidine (PEPCID) 20 MG tablet Take 20 mg by mouth 2 (two) times daily.    [provider]  furosemide (LASIX) 40 MG tablet Take 1 tablet (40 mg total) by mouth 2 (two) times daily. 11/23/19   Dhungel, Nishant, MD  memantine (NAMENDA) 10 MG tablet TAKE 1 TABLET BY MOUTH TWICE A DAY Patient taking differently: Take  10 mg by mouth 2 (two) times daily. 05/02/19   Tat, Eustace Quail, DO  metoprolol succinate (TOPROL-XL) 25 MG 24 hr tablet Take 0.5 tablets (12.5 mg total) by mouth daily. 12/19/20   Jennye Boroughs, MD  mirtazapine (REMERON) 15 MG tablet Take 15 mg by mouth at bedtime.    [provider]  Polyethyl Glycol-Propyl Glycol ([employer] LUBRICANT EYE DROPS) 0.4-0.3 % SOLN Place 2 drops into both eyes every 4 (four) hours as needed (dry eyes).    [provider]      VITAL SIGNS:  Blood pressure 117/85, pulse (!) 102, temperature 97.8 F (36.6 C), temperature source Oral, resp. rate 11, SpO2 96 %.  PHYSICAL EXAMINATION:  Physical Exam  GENERAL:  87 y.o.-year-old Caucasian male patient lying in the bed with mild respiratory distress with conversational dyspnea. EYES: Pupils  equal, round, reactive to light and accommodation. No scleral icterus. Extraocular muscles intact.  HEENT: Head with left forehead abrasion with good hemostasis., normocephalic. Oropharynx and nasopharynx clear.  NECK:  Supple, no jugular venous distention. No thyroid enlargement, no tenderness.  LUNGS: Diminished bibasal and right midlung zone breath sounds. CARDIOVASCULAR: Regular rate and rhythm, S1, S2 normal.  2/6 to 3/6 systolic murmur at the left lower sternal border with no gallops or rubs. ABDOMEN: Soft, nondistended, nontender. Bowel sounds present. No organomegaly or mass.  EXTREMITIES: Bilateral lower extremity 1+ pitting edema with no cyanosis, or clubbing.  NEUROLOGIC: Cranial nerves II through XII are intact. Muscle strength 5/5 in all extremities. Sensation intact. Gait not checked.  PSYCHIATRIC: The patient is alert and cooperative.  Normal affect and good eye contact. SKIN: No obvious rash, lesion, or ulcer.   LABORATORY PANEL:   CBC Recent Labs  Lab 01/13/21 2113  WBC 6.8  HGB 11.2*  HCT 36.1*  PLT 220    ------------------------------------------------------------------------------------------------------------------  Chemistries  Recent Labs  Lab 01/13/21 2113  NA 139  K 4.0  CL 101  CO2 26  GLUCOSE 115*  BUN 44*  CREATININE 1.57*  CALCIUM 8.3*  AST 26  ALT 6  ALKPHOS 77  BILITOT 0.7   ------------------------------------------------------------------------------------------------------------------  Cardiac Enzymes No results for input(s): TROPONINI in the last 168 hours. ------------------------------------------------------------------------------------------------------------------  RADIOLOGY:  CT Head Wo Contrast  Result Date: 01/13/2021 CLINICAL DATA:  [place] with bruises to the forehead.  Neck trauma. EXAM: CT HEAD WITHOUT CONTRAST CT CERVICAL SPINE WITHOUT CONTRAST TECHNIQUE: Multidetector CT imaging of the head and cervical spine was performed following the standard protocol without intravenous contrast. Multiplanar CT image reconstructions of the cervical spine were also generated. COMPARISON:  CT head and cervical spine 07/31/2018 FINDINGS: CT HEAD FINDINGS Brain: No evidence of acute infarction, hemorrhage, hydrocephalus, extra-axial collection or mass lesion/mass effect. Diffuse cerebral atrophy. Ventricular dilatation likely due to central atrophy. Patchy low-attenuation changes in the deep white matter consistent small vessel ischemia. Vascular: Moderate intracranial arterial vascular calcifications. Skull: Calvarium appears intact. Sinuses/Orbits: Paranasal sinuses and mastoid air cells are clear. Other: None. CT CERVICAL SPINE FINDINGS Alignment: Normal alignment Skull base and vertebrae: No acute fracture. No primary bone lesion or focal pathologic process. Soft tissues and spinal canal: No prevertebral fluid or swelling. No visible canal hematoma. Disc levels: Degenerative changes with narrowed interspaces and endplate hypertrophic change most prominent at C5-6 and  C6-7 levels. Prominent ligamentous calcifications. Degenerative changes in the facet joints. Upper chest: Bilateral pleural effusions are demonstrated in the apices mid Other: Spur calcifications IMPRESSION: 1. No acute intracranial abnormalities. Chronic atrophy and small vessel ischemic changes. 2. Normal alignment of the cervical spine. Degenerative changes. No acute displaced fractures identified. 3. Bilateral pleural effusions. Electronically Signed   By: Lucienne Capers M.D.   On: 01/13/2021 22:06   CT Cervical Spine Wo Contrast  Result Date: 01/13/2021 CLINICAL DATA:  [place] with bruises to the forehead.  Neck trauma. EXAM: CT HEAD WITHOUT CONTRAST CT CERVICAL SPINE WITHOUT CONTRAST TECHNIQUE: Multidetector CT imaging of the head and cervical spine was performed following the standard protocol without intravenous contrast. Multiplanar CT image reconstructions of the cervical spine were also generated. COMPARISON:  CT head and cervical spine 07/31/2018 FINDINGS: CT HEAD FINDINGS Brain: No evidence of acute infarction, hemorrhage, hydrocephalus, extra-axial collection or mass lesion/mass effect. Diffuse cerebral atrophy. Ventricular dilatation likely due to central atrophy. Patchy low-attenuation changes in the deep white matter consistent small vessel ischemia. Vascular:  Moderate intracranial arterial vascular calcifications. Skull: Calvarium appears intact. Sinuses/Orbits: Paranasal sinuses and mastoid air cells are clear. Other: None. CT CERVICAL SPINE FINDINGS Alignment: Normal alignment Skull base and vertebrae: No acute fracture. No primary bone lesion or focal pathologic process. Soft tissues and spinal canal: No prevertebral fluid or swelling. No visible canal hematoma. Disc levels: Degenerative changes with narrowed interspaces and endplate hypertrophic change most prominent at C5-6 and C6-7 levels. Prominent ligamentous calcifications. Degenerative changes in the facet joints. Upper chest:  Bilateral pleural effusions are demonstrated in the apices mid Other: Spur calcifications IMPRESSION: 1. No acute intracranial abnormalities. Chronic atrophy and small vessel ischemic changes. 2. Normal alignment of the cervical spine. Degenerative changes. No acute displaced fractures identified. 3. Bilateral pleural effusions. Electronically Signed   By: Lucienne Capers M.D.   On: 01/13/2021 22:06   DG Chest Port 1 View  Result Date: 01/13/2021 CLINICAL DATA:  Cough and shortness of breath.  Weakness and fall. EXAM: PORTABLE CHEST 1 VIEW COMPARISON:  12/19/2020 FINDINGS: Cardiac pacemaker. Shallow inspiration. Heart size is obscured but appears enlarged. Bilateral perihilar infiltration/edema. Moderate right pleural effusion. Progression of changes since previous study. Calcification of the aorta. IMPRESSION: Progression of bilateral perihilar infiltration/edema and right pleural effusion. Electronically Signed   By: Lucienne Capers M.D.   On: 01/13/2021 21:28      IMPRESSION AND PLAN:  Active Problems:   Acute CHF (congestive heart failure) ([place])  1.  Acute on chronic systolic CHF/HFrEF with subsequent acute on chronic hypoxic respiratory failure. - The patient be admitted to a progressive unit bed. - We will continue diuresis with IV Lasix. - I added Entresto to his regimen and we will continue Toprol-XL. - He had a 2D echo on 05/10/2020 revealing an EF of 30 to 35% with mild left atrial and right atrial dilatation, trace mitral regurgitation and moderate aortic stenosis. - Cardiology consult to be obtained. - I notified Dr. Nehemiah Massed about the patient.  2.  Symptomatic large right pleural effusion with subsequent acute on chronic hypoxic respiratory failure. - The patient will be diuresed with IV Lasix as mentioned above. - We will obtain an IR consult for right thoracentesis. - I discussed the procedure with the patient's son and he was agreeable to proceed with that. - Aspirin is  being temporarily held off. -COVID-19 PCR result is currently pending.   3.  Paroxysmal atrial fibrillation with rapid ventricular response. - We will continue his amiodarone and monitor his rate with diuresis. - We will continue his aspirin. - He is obviously a fall risk and therefore is not on any anticoagulation.  4.  Dyslipidemia. - We will continue statin therapy.  5.  Parkinson's disease with behavioral changes and associated dementia. - We will continue Sinemet IR.  6.  Dementia with depression. - We will continue Aricept, Namenda, Remeron and Depakote.  7.  Fall with no injuries except for left forehead abrasion with good hemostasis. - Fall precautions will be followed. - PT consult will be obtained.  DVT prophylaxis: Lovenox. Code Status: The patient is DNR/DNI.  This was confirmed with his son and POA over the phone. Family Communication:  The plan of care was discussed in details with the patient and his son over the phone.  I answered all questions. The patient agreed to proceed with the above mentioned plan. Further management will depend upon hospital course. Disposition Plan: Back to previous home environment Consults called: Cardiology All the records are reviewed and case discussed with  ED provider.  Status is: Inpatient  Remains inpatient appropriate because:Ongoing diagnostic testing needed not appropriate for outpatient work up, Unsafe d/c plan, IV treatments appropriate due to intensity of illness or inability to take PO and Inpatient level of care appropriate due to severity of illness   Dispo: The patient is from: SNF              Anticipated d/c is to: SNF              Patient currently is not medically stable to d/c.   Difficult to place patient No      TOTAL TIME TAKING CARE OF THIS PATIENT: 55 minutes.    Christel Mormon M.D on 01/14/2021 at 12:16 AM  [facility]   From 7 PM-7 AM, contact night-coverage [email or web address]  CC: Primary care  physician; [name], MD

## 2021-01-14 NOTE — Consult Note (Signed)
Remdesivir - Pharmacy Brief Note   O:  ALT: 6 CXR: Progression of bilateral perihilar infiltration/edema and right pleural effusion SpO2: 93% on 3L [place]   A/P:  Remdesivir 200 mg IVPB once followed by 100 mg IVPB daily x 4 days.   Sherilyn Banker, PharmD Pharmacy Resident  01/14/2021 6:56 PM

## 2021-01-14 NOTE — Consult Note (Signed)
[facility] Consultation Note  Patient ID: Eric Hendricks, MRN: [id number], DOB/AGE: [birth year] 85 y.o. Admit date: 01/13/2021   Date of Consult: 01/14/2021 Primary Physician: [name], MD Primary Cardiologist: None  Chief Complaint:  Chief Complaint  Patient presents with  . Fall    Pt coming from [facility] with complaints of being found on floor. Pt reports he "rolled out of bed". Pt with abrasion to left forehead. Pt alert. Denies other complaints    Reason for Consult: HPI    Fall     Additional comments: Pt coming from [facility] with complaints of being found on floor. Pt reports he "rolled out of bed". Pt with abrasion to left forehead. Pt alert. Denies other complaints        Last edited by Rosealee Albee, RN on 01/13/2021  8:27 PM. (History)       HPI: 87 y.o. male with known chronic nonvalvular atrial fibrillation with chronic kidney disease stage III hypertension and hyperlipidemia and Parkinson's with dementia who had shortness of breath and a fall and had an abrasion to his forehead.  The patient was seen in the emergency room at which time he had a BNP of 592, glomerular filtration rate of 41 with mild anemia and hemoglobin of 11.1.  Chest x-ray shows pleural effusion and mild pulmonary edema and EKG shows atrial fibrillation with controlled ventricular rate and left bundle branch block.  Patient was given intravenous Lasix with some improvements in his oxygenation although there is not any recordings of significant amount of urine output.  He has been placed on metoprolol and amiodarone apparently for heart rate control although it does not appear that he is maintaining normal rhythm.  Telemetry shows reasonable heart rate control in the 80 bpm range.  Currently he is hemodynamically stable with no evidence of acute coronary syndrome or anginal symptoms at this time  Past Medical History:  Diagnosis Date  . Arrhythmia    atrial fibrillation  .  B12 deficiency   . CHF (congestive heart failure) ([place])   . Dementia ([place])   . Depression   . Hyperlipemia   . Parkinson's disease ([place])   . Prostate cancer [facility])       Surgical History:  Past Surgical History:  Procedure Laterality Date  . ANKLE SURGERY Right   . APPENDECTOMY    . CATARACT EXTRACTION Bilateral   . TRANSURETHRAL RESECTION OF PROSTATE       Home Meds: Prior to Admission medications   Medication Sig Start Date End Date Taking? Authorizing Provider  acetaminophen (TYLENOL) 325 MG tablet Take 650 mg by mouth 2 (two) times daily.   Yes [provider]  acetaminophen (TYLENOL) 500 MG tablet Take 500 mg by mouth daily as needed for mild pain.   Yes [provider]  amiodarone (PACERONE) 100 MG tablet Take 100 mg by mouth daily. 11/03/19  Yes [provider]  ascorbic acid (VITAMIN C) 500 MG tablet Take 500 mg by mouth daily.   Yes [provider]  aspirin EC 81 MG tablet Take 81 mg by mouth daily.   Yes [provider]  atorvastatin (LIPITOR) 80 MG tablet Take 80 mg by mouth every evening. 11/03/19  Yes [provider]  carbidopa-levodopa (SINEMET IR) 25-100 MG tablet Take 1 tablet by mouth 3 (three) times daily. 1 tablet at 9am/1pm/5pm.  Please give meds 20 min prior to meal or 1 hour after the meal 08/29/20  Yes Tat, Eustace Quail,  DO  divalproex (DEPAKOTE) 250 MG DR tablet Take 1 tablet (250 mg total) by mouth 3 (three) times daily. 05/11/20  Yes Jennye Boroughs, MD  docusate sodium (COLACE) 100 MG capsule Take 100 mg by mouth 2 (two) times daily as needed for mild constipation or moderate constipation.   Yes [provider]  donepezil (ARICEPT) 10 MG tablet TAKE 1 TABLET BY MOUTH AT BEDTIME Patient taking differently: Take 10 mg by mouth at bedtime. 05/02/19  Yes Tat, Eustace Quail, DO  famotidine (PEPCID) 20 MG tablet Take 20 mg by mouth 2 (two) times daily.   Yes [provider]  furosemide (LASIX) 40 MG  tablet Take 1 tablet (40 mg total) by mouth 2 (two) times daily. 11/23/19  Yes Dhungel, Nishant, MD  ipratropium-albuterol (DUONEB) 0.5-2.5 (3) MG/3ML SOLN Take 3 mLs by nebulization every 6 (six) hours as needed for shortness of breath. 12/26/20  Yes [provider]  memantine (NAMENDA) 10 MG tablet TAKE 1 TABLET BY MOUTH TWICE A DAY Patient taking differently: Take 10 mg by mouth 2 (two) times daily. 05/02/19  Yes Tat, Eustace Quail, DO  metoprolol succinate (TOPROL-XL) 25 MG 24 hr tablet Take 0.5 tablets (12.5 mg total) by mouth daily. 12/19/20  Yes Jennye Boroughs, MD  mirtazapine (REMERON) 15 MG tablet Take 15 mg by mouth at bedtime.   Yes [provider]  Polyethyl Glycol-Propyl Glycol ([employer] LUBRICANT EYE DROPS) 0.4-0.3 % SOLN Place 2 drops into both eyes every 4 (four) hours as needed (dry eyes).   Yes [provider]    Inpatient Medications:  . amiodarone  100 mg Oral Daily  . ascorbic acid  500 mg Oral Daily  . atorvastatin  80 mg Oral QPM  . bacitracin   Topical BID  . carbidopa-levodopa  1 tablet Oral TID  . divalproex  250 mg Oral TID  . donepezil  10 mg Oral QHS  . enoxaparin (LOVENOX) injection  30 mg Subcutaneous Q24H  . famotidine  20 mg Oral BID  . furosemide  60 mg Intravenous BID  . memantine  10 mg Oral BID  . metoprolol succinate  12.5 mg Oral Daily  . mirtazapine  15 mg Oral QHS     Allergies:  Allergies  Allergen Reactions  . Phenylephrine-Guaifenesin Palpitations  . Aspirin Other (See Comments)  . Guaifenesin Other (See Comments)    Unknown reaction  . Guanfacine   . Phenylephrine   . Suprax [Cefixime]     Social History   Socioeconomic History  . Marital status: Widowed    Spouse name: Not on file  . Number of children: 3  . Years of education: Not on file  . Highest education level: Some college, no degree  Occupational History  . Not on file  Tobacco Use  . Smoking status: Never Smoker  . Smokeless tobacco: Never Used   Vaping Use  . Vaping Use: Never used  Substance and Sexual Activity  . Alcohol use: No    Alcohol/week: 0.0 standard drinks  . Drug use: No  . Sexual activity: Not on file  Other Topics Concern  . Not on file  Social History Narrative   Pt lives at [place]   Right handed   Pt has 3 children   He drinks some coffee, tea, mostly milk.    Social Determinants of Health   Financial Resource Strain: Not on file  Food Insecurity: Not on file  Transportation Needs: Not on file  Physical Activity: Not on file  Stress: Not on file  Social Connections: Not on file  Intimate Partner Violence: Not on file     Family History  Problem Relation Age of Onset  . Colon cancer Father   . Tremor Mother   . Parkinson's disease Brother   . Prostate cancer Brother   . Lung cancer Sister   . Emphysema Sister   . Heart failure Child   . Diabetes Child   . AAA (abdominal aortic aneurysm) Child      Review of Systems Cannot assess due to dementia Labs: No results for input(s): CKTOTAL, CKMB, TROPONINI in the last 72 hours. Lab Results  Component Value Date   WBC 5.0 01/14/2021   HGB 10.1 (L) 01/14/2021   HCT 32.5 (L) 01/14/2021   MCV 96.4 01/14/2021   PLT 192 01/14/2021    Recent Labs  Lab 01/13/21 2113 01/14/21 0512  NA 139 140  K 4.0 3.7  CL 101 101  CO2 26 29  BUN 44* 42*  CREATININE 1.57* 1.43*  CALCIUM 8.3* 8.0*  PROT 7.2  --   BILITOT 0.7  --   ALKPHOS 77  --   ALT 6  --   AST 26  --   GLUCOSE 115* 104*   Lab Results  Component Value Date   TRIG 78 12/16/2020   Lab Results  Component Value Date   DDIMER 2.56 (H) 12/15/2020    Radiology/Studies:  CT Head Wo Contrast  Result Date: 01/13/2021 CLINICAL DATA:  [place] with bruises to the forehead.  Neck trauma. EXAM: CT HEAD WITHOUT CONTRAST CT CERVICAL SPINE WITHOUT CONTRAST TECHNIQUE: Multidetector CT imaging of the head and cervical spine was performed following the standard  protocol without intravenous contrast. Multiplanar CT image reconstructions of the cervical spine were also generated. COMPARISON:  CT head and cervical spine 07/31/2018 FINDINGS: CT HEAD FINDINGS Brain: No evidence of acute infarction, hemorrhage, hydrocephalus, extra-axial collection or mass lesion/mass effect. Diffuse cerebral atrophy. Ventricular dilatation likely due to central atrophy. Patchy low-attenuation changes in the deep white matter consistent small vessel ischemia. Vascular: Moderate intracranial arterial vascular calcifications. Skull: Calvarium appears intact. Sinuses/Orbits: Paranasal sinuses and mastoid air cells are clear. Other: None. CT CERVICAL SPINE FINDINGS Alignment: Normal alignment Skull base and vertebrae: No acute fracture. No primary bone lesion or focal pathologic process. Soft tissues and spinal canal: No prevertebral fluid or swelling. No visible canal hematoma. Disc levels: Degenerative changes with narrowed interspaces and endplate hypertrophic change most prominent at C5-6 and C6-7 levels. Prominent ligamentous calcifications. Degenerative changes in the facet joints. Upper chest: Bilateral pleural effusions are demonstrated in the apices mid Other: Spur calcifications IMPRESSION: 1. No acute intracranial abnormalities. Chronic atrophy and small vessel ischemic changes. 2. Normal alignment of the cervical spine. Degenerative changes. No acute displaced fractures identified. 3. Bilateral pleural effusions. Electronically Signed   By: Lucienne Capers M.D.   On: 01/13/2021 22:06   CT ANGIO CHEST PE W OR WO CONTRAST  Result Date: 12/15/2020 CLINICAL DATA:  Elevated D-dimer, dyspnea EXAM: CT ANGIOGRAPHY CHEST WITH CONTRAST TECHNIQUE: Multidetector CT imaging of the chest was performed using the standard protocol during bolus administration of intravenous contrast. Multiplanar CT image reconstructions and MIPs were obtained to evaluate the vascular anatomy. CONTRAST:  61mL  OMNIPAQUE IOHEXOL 350 MG/ML SOLN COMPARISON:  12/13/2018 FINDINGS: Cardiovascular: There is adequate opacification of the pulmonary arterial tree. There is no intraluminal filling defect identified to suggest acute pulmonary embolism. There is relative hypoenhancement  of the right pulmonary arterial tree, likely related to vascular shunting secondary to subtotal collapse of the right lung. The central pulmonary arteries are of normal caliber. Moderate multi-vessel coronary artery calcification with possible stenting of the left anterior descending coronary artery. Global cardiac size is within normal limits. Left subclavian pacemaker leads are seen within the right atrium and right ventricle toward the apex. No pericardial effusion. Moderate atherosclerotic calcification within the thoracic aorta. No aortic aneurysm. Mediastinum/Nodes: No pathologic thoracic adenopathy. Visualized thyroid is unremarkable. Esophagus is unremarkable. Lungs/Pleura: There is near complete collapse of the right lung secondary to compressive atelectasis of a large right pleural effusion. The right middle and lower lobes are completely collapsed. There is partial aeration of the right upper lobe. No central obstructing mass is identified. The costophrenic angle is excluded from view on this examination. Small left pleural effusion is present with mild compressive atelectasis of the left lower lobe. No superimposed focal pulmonary infiltrate. No pneumothorax. Upper Abdomen: No acute abnormality within the visualized upper abdomen. Musculoskeletal: No acute bone abnormality. No lytic or blastic bone lesion identified. Review of the MIP images confirms the above findings. IMPRESSION: No pulmonary embolism. Large right pleural effusion with subtotal collapse of the right lung. Small left pleural effusion also identified. Moderate coronary artery calcification. Aortic Atherosclerosis (ICD10-I70.0). Electronically Signed   By: Fidela Salisbury MD    On: 12/15/2020 23:25   CT Cervical Spine Wo Contrast  Result Date: 01/13/2021 CLINICAL DATA:  [place] with bruises to the forehead.  Neck trauma. EXAM: CT HEAD WITHOUT CONTRAST CT CERVICAL SPINE WITHOUT CONTRAST TECHNIQUE: Multidetector CT imaging of the head and cervical spine was performed following the standard protocol without intravenous contrast. Multiplanar CT image reconstructions of the cervical spine were also generated. COMPARISON:  CT head and cervical spine 07/31/2018 FINDINGS: CT HEAD FINDINGS Brain: No evidence of acute infarction, hemorrhage, hydrocephalus, extra-axial collection or mass lesion/mass effect. Diffuse cerebral atrophy. Ventricular dilatation likely due to central atrophy. Patchy low-attenuation changes in the deep white matter consistent small vessel ischemia. Vascular: Moderate intracranial arterial vascular calcifications. Skull: Calvarium appears intact. Sinuses/Orbits: Paranasal sinuses and mastoid air cells are clear. Other: None. CT CERVICAL SPINE FINDINGS Alignment: Normal alignment Skull base and vertebrae: No acute fracture. No primary bone lesion or focal pathologic process. Soft tissues and spinal canal: No prevertebral fluid or swelling. No visible canal hematoma. Disc levels: Degenerative changes with narrowed interspaces and endplate hypertrophic change most prominent at C5-6 and C6-7 levels. Prominent ligamentous calcifications. Degenerative changes in the facet joints. Upper chest: Bilateral pleural effusions are demonstrated in the apices mid Other: Spur calcifications IMPRESSION: 1. No acute intracranial abnormalities. Chronic atrophy and small vessel ischemic changes. 2. Normal alignment of the cervical spine. Degenerative changes. No acute displaced fractures identified. 3. Bilateral pleural effusions. Electronically Signed   By: Lucienne Capers M.D.   On: 01/13/2021 22:06   DG Chest Port 1 View  Result Date: 01/13/2021 CLINICAL DATA:  Cough and shortness of  breath.  Weakness and fall. EXAM: PORTABLE CHEST 1 VIEW COMPARISON:  12/19/2020 FINDINGS: Cardiac pacemaker. Shallow inspiration. Heart size is obscured but appears enlarged. Bilateral perihilar infiltration/edema. Moderate right pleural effusion. Progression of changes since previous study. Calcification of the aorta. IMPRESSION: Progression of bilateral perihilar infiltration/edema and right pleural effusion. Electronically Signed   By: Lucienne Capers M.D.   On: 01/13/2021 21:28   DG Chest Port 1 View  Result Date: 12/19/2020 CLINICAL DATA:  Recurrent right pleural effusion and status post  thoracentesis. EXAM: PORTABLE CHEST 1 VIEW COMPARISON:  From earlier today at 1126 hours FINDINGS: Stable heart size and appearance of dual-chamber pacemaker. Decrease in pleural fluid since the prior chest x-ray with some residual partially loculated fluid remaining. No pneumothorax. Improved aeration of the right lung. IMPRESSION: Decrease in right pleural fluid after thoracentesis without pneumothorax. Improved aeration of the right lung. Electronically Signed   By: Aletta Edouard M.D.   On: 12/19/2020 16:29   DG Chest Port 1 View  Result Date: 12/19/2020 CLINICAL DATA:  Dyspnea EXAM: PORTABLE CHEST 1 VIEW COMPARISON:  12/16/2020 FINDINGS: Dual lead pacer. Patient rotated right. Midline trachea. Mild cardiomegaly. Atherosclerosis in the transverse aorta. Moderate to large right pleural effusion with suggestion of loculation laterally, increased. No left pleural effusion. No pneumothorax. Mild pulmonary venous congestion. Increased right and similar left base airspace disease. IMPRESSION: Increase in moderate to large right pleural effusion with suggestion of loculation laterally. Adjacent right base airspace disease which could represent atelectasis or infection. Similar left base Airspace disease, likely atelectasis. Aortic Atherosclerosis (ICD10-I70.0). Electronically Signed   By: Abigail Miyamoto M.D.   On: 12/19/2020  11:48   DG Chest Port 1 View  Result Date: 12/16/2020 CLINICAL DATA:  Pleural effusion status post right thoracentesis EXAM: PORTABLE CHEST 1 VIEW COMPARISON:  12/16/2020 FINDINGS: Single frontal view of the chest demonstrates decreased right pleural effusion after thoracentesis, with no evidence of pneumothorax. Moderate residual loculated right pleural effusion with continued areas of consolidation at the right lung base. Left chest is clear. Cardiac silhouette is stable. Dual lead pacemaker unchanged. IMPRESSION: 1. Moderate residual loculated right pleural effusion after thoracentesis. No evidence of pneumothorax. Electronically Signed   By: Randa Ngo M.D.   On: 12/16/2020 15:45   DG Chest Port 1 View  Result Date: 12/16/2020 CLINICAL DATA:  Pleural effusion and shortness of breath. EXAM: PORTABLE CHEST 1 VIEW COMPARISON:  12/15/2020 FINDINGS: Left chest wall pacer device is noted with leads in the right atrial appendage and right ventricle. Heart size is normal. Further increase in volume of right pleural effusion with near complete opacification of the right hemithorax. Increase interstitial markings in the left lung compatible with interstitial edema. IMPRESSION: Increase in volume of right pleural effusion with near complete opacification of the right hemithorax. Electronically Signed   By: Kerby Moors M.D.   On: 12/16/2020 12:52   US THORACENTESIS ASP PLEURAL SPACE W/IMG GUIDE  Result Date: 12/19/2020 INDICATION: Patient with a history of CHF and recurrent pleural effusions. Interventional radiology asked to perform a therapeutic thoracentesis. EXAM: ULTRASOUND GUIDED THORACENTESIS MEDICATIONS: 1% lidocaine 10 mL COMPLICATIONS: None immediate. PROCEDURE: An ultrasound guided thoracentesis was thoroughly discussed with the patient and questions answered. The benefits, risks, alternatives and complications were also discussed. The patient understands and wishes to proceed with the  procedure. Written consent was obtained. Ultrasound was performed to localize and mark an adequate pocket of fluid in the right chest. The area was then prepped and draped in the normal sterile fashion. 1% Lidocaine was used for local anesthesia. Under ultrasound guidance a 6 Fr Safe-T-Centesis catheter was introduced. Thoracentesis was performed. The catheter was removed and a dressing applied. FINDINGS: A total of approximately 1.1 L of bloody fluid was removed. IMPRESSION: Successful ultrasound guided right thoracentesis yielding 0.1 L of pleural fluid. Read by: Soyla Dryer, NP Electronically Signed   By: Aletta Edouard M.D.   On: 12/19/2020 16:29   US THORACENTESIS ASP PLEURAL SPACE W/IMG GUIDE  Result Date: 12/16/2020  CLINICAL DATA:  Recurrent right pleural effusion and status post prior thoracentesis procedures. The most recent is dated 11/04/2020. EXAM: ULTRASOUND GUIDED RIGHT THORACENTESIS COMPARISON:  None. PROCEDURE: An ultrasound guided thoracentesis was thoroughly discussed with the patient and questions answered. The benefits, risks, alternatives and complications were also discussed. The patient understands and wishes to proceed with the procedure. Written consent was obtained. Ultrasound was performed to localize and mark an adequate pocket of fluid in the right chest. The area was then prepped and draped in the normal sterile fashion. 1% Lidocaine was used for local anesthesia. Under ultrasound guidance a 6 French Safe-T-Centesis catheter was introduced. Thoracentesis was performed. The catheter was removed and a dressing applied. COMPLICATIONS: None FINDINGS: A total of approximately 2 L of grossly bloody fluid was removed. Some fluid remain present but the procedure had to be halted due to development of significant right chest pain during evacuation. A fluid sample was sent for laboratory analysis. IMPRESSION: Successful ultrasound guided right thoracentesis yielding 2 L of bloody pleural  fluid. Electronically Signed   By: Aletta Edouard M.D.   On: 12/16/2020 15:33    EKG: Atrial fibrillation with controlled ventricular rate and left bundle branch block  Weights: Filed Weights   01/14/21 0158 01/14/21 0903  Weight: 65.1 kg 62.4 kg     Physical Exam: Blood pressure 92/62, pulse 91, temperature 98.3 F (36.8 C), temperature source Oral, resp. rate (!) 22, weight 62.4 kg, SpO2 93 %. Body mass index is 20.92 kg/m. General: Well developed, well nourished, in no acute distress. Head eyes ears nose throat: Normocephalic, atraumatic, sclera non-icteric, no xanthomas, nares are without discharge. No apparent thyromegaly and/or mass  Lungs: Normal respiratory effort.  Few wheezes, no rales, some rhonchi.  Heart: Irregular with normal S1 S2. no murmur gallop, no rub, PMI is normal size and placement, carotid upstroke normal without bruit, jugular venous pressure is normal Abdomen: Soft, non-tender, non-distended with normoactive bowel sounds. No hepatomegaly. No rebound/guarding. No obvious abdominal masses. Abdominal aorta is normal size without bruit Extremities: Trace edema. no cyanosis, no clubbing, no ulcers  Peripheral : 2+ bilateral upper extremity pulses, 2+ bilateral femoral pulses, 2+ bilateral dorsal pedal pulse Neuro: Not alert and oriented. No facial asymmetry. No focal deficit. Moves all extremities spontaneously. Musculoskeletal: Normal muscle tone without kyphosis Psych: Does not responds to questions appropriately with a normal affect.    Assessment: 87 year old male with acute on chronic systolic dysfunction congestive heart failure with elevated BNP chronic kidney disease pleural effusions and pulmonary edema with chronic nonvalvular atrial fibrillation having a fall and abrasion to his head without evidence of myocardial infarction  Plan: 1.  Continue metoprolol and amiodarone for heart rate control of atrial fibrillation at this time which appears to be  adequate.  Will consider discontinuation of amiodarone due to the fact that he is not maintaining normal sinus rhythm at this time 2.  Abstain from anticoagulation at this time due to concerns of instability blood pressure variability and frequent falls due to Parkinson's.  Will reassess the possibility of anticoagulation after further evaluation during hospitalization 3.  Continue Lasix intravenously at this time for pulmonary edema and pleural effusion and acute on chronic systolic dysfunction congestive heart failure.  Will consider changing over to oral Lasix tomorrow watching for any worsening chronic kidney disease 4.  No further cardiac diagnostics necessary at this time due to recent echocardiogram showing moderate LV systolic dysfunction with ejection fraction of 35% and no current evidence of acute  coronary syndrome 5.  High intensity cholesterol therapy 6.  Begin ambulation and follow-up for need for adjustments of medication management  Signed, Corey Skains M.D. [facility] 01/14/2021, 1:49 PM

## 2021-01-14 NOTE — Evaluation (Signed)
Physical Therapy Evaluation Patient Details Name: Eric Hendricks MRN: [id number] DOB: [birth year] Today's Date: 01/14/2021   History of Present Illness  Pt is a 87 y/o M with PMH: parkinson's disease, dementia, CAD, sCHF, and depression.  Pt just recently admitted for recurrent pleural effusion. Presents this time from [facility] s/p fall. Found to have COVID-19.    Clinical Impression  Patient received attempting to get from recliner back to bed by himself with alarm going off. NT and myself entered room and assisted. He had soiled himself either prior to getting up or while moving back to bed. Required cleaning up. Patient gets easily fatigued with standing for cleaning. He is limited by increased HR and decreased O2 with mobility. Patient will continue to benefit from skilled PT while here to improve strength and functional independence.       Follow Up Recommendations SNF    Equipment Recommendations  None recommended by PT    Recommendations for Other Services       Precautions / Restrictions Precautions Precautions: Fall Restrictions Weight Bearing Restrictions: No Other Position/Activity Restrictions: monitor BP and O2      Mobility  Bed Mobility Overal bed mobility: Modified Independent             General bed mobility comments: increased time, he attempts to scoot up in the bed independently.    Transfers Overall transfer level: Needs assistance Equipment used: 1 person hand held assist Transfers: Sit to/from [facility] Sit to Stand: Min assist Stand pivot transfers: Min assist       General transfer comment: increased time. Patient fatigues quickly in standing and requires seated rest break while being assisted for cleaning.  Ambulation/Gait Ambulation/Gait assistance: Min Web designer (Feet): 2 Feet Assistive device: 1 person hand held assist Gait Pattern/deviations: Step-to pattern;Decreased step length - right;Decreased step  length - left Gait velocity: decr   General Gait Details: patient chair alarm going off, patient attempting to get back into bed when myself and NT arrived in room. Patient had BM in chair.  Stairs            Wheelchair Mobility    Modified Rankin (Stroke Patients Only)       Balance Overall balance assessment: Needs assistance Sitting-balance support: Feet supported Sitting balance-Leahy Scale: Good     Standing balance support: During functional activity;Single extremity supported Standing balance-Leahy Scale: Fair Standing balance comment: Requires UE support for balance with standing                             Pertinent Vitals/Pain Pain Assessment: No/denies pain    Home Living Family/patient expects to be discharged to:: Assisted living               Home Equipment: Walker - 2 wheels;Shower seat Additional Comments: difficulty assessing PLOF and available equipment from pt due to him being very HOH    Prior Function Level of Independence: Needs assistance   Gait / Transfers Assistance Needed: Pt reports using RW for fxl mobility in the ALF. Pt is HOH and somewhat confused, but [facility] confirmed via telephone to OT last admission that pt does use  RW.  ADL's / Homemaking Assistance Needed: pt reports being INDEP for bathing, dressing, and toileting. States facility assists with meals and meds. [facility] reports that pt was needing assist for bathing occasionally when called on last admission in March 2021  Hand Dominance   Dominant Hand: Right    Extremity/Trunk Assessment   Upper Extremity Assessment Upper Extremity Assessment: Generalized weakness    Lower Extremity Assessment Lower Extremity Assessment: Generalized weakness       Communication   Communication: HOH  Cognition Arousal/Alertness: Awake/alert Behavior During Therapy: WFL for tasks assessed/performed Overall Cognitive Status: Difficult to assess                                  General Comments: able to follow all simple one step commands      General Comments General comments (skin integrity, edema, etc.): Patients HR up into 120s with minimal mobility, O2 sats dropping into mid to low 80%s with mobility.    Exercises Other Exercises Other Exercises: Ed re: role of OT, use of call button, importance of OOB activity, safety considerations. Pt with MIN/MOD reception, difficult to guage given [place].   Assessment/Plan    PT Assessment Patient needs continued PT services  PT Problem List Decreased strength;Decreased mobility;Decreased safety awareness;Decreased activity tolerance;Decreased balance;Decreased cognition;Cardiopulmonary status limiting activity       PT Treatment Interventions DME instruction;Therapeutic exercise;Gait training;Balance training;Functional mobility training;Therapeutic activities;Patient/family education    PT Goals (Current goals can be found in the Care Plan section)  Acute Rehab PT Goals Patient Stated Goal: to get stronger PT Goal Formulation: With patient Time For Goal Achievement: 01/28/21 Potential to Achieve Goals: Fair    Frequency Min 2X/week   Barriers to discharge        Co-evaluation               AM-PAC PT "6 Clicks" Mobility  Outcome Measure Help needed turning from your back to your side while in a flat bed without using bedrails?: A Little Help needed moving from lying on your back to sitting on the side of a flat bed without using bedrails?: A Little Help needed moving to and from a bed to a chair (including a wheelchair)?: A Little Help needed standing up from a chair using your arms (e.g., wheelchair or bedside chair)?: A Little Help needed to walk in hospital room?: A Lot Help needed climbing 3-5 steps with a railing? : A Lot 6 Click Score: 16    End of Session Equipment Utilized During Treatment: Oxygen Activity Tolerance: Patient limited by  fatigue Patient left: in bed;with call bell/phone within reach;with bed alarm set;with nursing/sitter in room Nurse Communication: Mobility status PT Visit Diagnosis: Unsteadiness on feet (R26.81);History of falling (Z91.81);Difficulty in walking, not elsewhere classified (R26.2);Muscle weakness (generalized) (M62.81)    Time: 9741-6384 PT Time Calculation (min) (ACUTE ONLY): 18 min   Charges:   PT Evaluation $PT Eval Moderate Complexity: 1 Mod PT Treatments $Therapeutic Activity: 8-22 mins        Jakelyn Squyres, PT, GCS 01/14/21,2:43 PM

## 2021-01-14 NOTE — Progress Notes (Signed)
PROGRESS NOTE  Brief Narrative: Eric Hendricks is a 87 y.o. male with a history of 3L oxygen-dependent COPD, chronic HFrEF, Parkinson's disease, AFib, dementia, depression, HLD who presented to the ED after falling out of his bed at home with resultant left forehead abrasion. In the ED he was tachycardic with oxygen requirement above baseline at 4L O2. BNP 582. ECG with AFib with RVR. CT cervical spine and head without acute fracture, dislocation or hemorrhage. CTA chest revealed bilateral perihilar infiltrates/edema and right >> left pleural effusion. IR consulted for repeat thoracentesis and cardiology consulted for assistance with diuresis. Covid-19 screening PCR was positive. CRP 3.2, PCT <0.10, d-dimer 5.66. Marland Kitchen   Subjective: Very HOH, reports his breathing is somewhat worse than his baseline, no wheezing. No orthopnea, very short of breath with exertion/getting up from bed with RN. No chest pain.   Objective: BP 95/60 (BP Location: Right Arm)   Pulse 88   Temp 97.7 F (36.5 C) (Oral)   Resp (!) 24   Wt 62.4 kg   SpO2 93%   BMI 20.92 kg/m   Gen: Elderly frail male in no acute distress Pulm: Diminished R > L bases. No crackles or wheezes.   CV: Irreg irreg, no murmur, no JVD, trace dependent edema GI: Soft, NT, ND, +BS  Neuro: Alert and oriented. Very HOH. No focal deficits. Skin: Left forehead and shoulder abrasions hemostatic.  Assessment & Plan: Active Problems:   Acute CHF (congestive heart failure) (HCC)  Recurrent right > left pleural effusion: D/w IR who reports loculations on U/S during last attempt, suspects chest tube may be necessary. PCT negative.  - PCCM consulted. Plan to Tx CHF and covid as below prior to considering chest tube, which would necessitate further goals of care discussions.   Acute on chronic HFrEF: Echo Aug 2021 w/LVEF 30-35%, mod AS.  - Cardiology consulted, giving lasix IV - Monitor I/O, daily weights, metabolic panel.  - With soft BPs this AM, will  hold entresto until cardiology evaluation. Of course will plan to be added back as BP and renal function allows.   Covid-19 pneumonia:  - Isolation as ordered - Initiated remdesivir, check CRP. If hypoxia remains worse than baseline after thoracentesis/diuresis, consider steroids.  - Repeat CXR in AM after diuresis.  - Trend d-dimer. V/Q scan not likely to be diagnostic with such an abnormal xray, will check Cr in AM to see if CTA could be supported. No anticoagulation initiated based on very high risk of falling and advanced age and stability of hypoxia.  PAF with RVR:  - Continue amiodarone, rate is better controlled.  - Avoiding anticoagulation with high fall risk  Stage IIIa CKD: Putative Dx.  - Avoid nephrotoxins if able.  CAD, HLD: +Moderate calcifications on CTA chest from March. No chest pain, cardiology does not suspect ACS. - Beta blocker to be resumed when able - Holding ASA with thoracentesis requested - Continue high-intensity statin  Parkinson's disease, dementia, depression:  - Continue sinemet - Continue aricept, namenda, remeron, depakote.   Fall at home: No lacerations requiring repair in ED, negative trauma C spine/CT head.  - PT, OT eval.  Elevated TSH: 9.763.  - Check T3, T4  Patrecia Pour, MD Pager on amion 01/14/2021, 10:17 AM

## 2021-01-14 NOTE — Progress Notes (Signed)
Anticoagulation monitoring(Lovenox):  87 yo male ordered Lovenox 40 mg Q24h  Filed Weights   01/14/21 0158  Weight: 65.1 kg (143 lb 8.3 oz)   BMI 21.82   Lab Results  Component Value Date   CREATININE 1.57 (H) 01/13/2021   CREATININE 1.42 (H) 12/18/2020   CREATININE 1.29 (H) 12/16/2020   Estimated Creatinine Clearance: 26.5 mL/min (A) (by C-G formula based on SCr of 1.57 mg/dL (H)). Hemoglobin & Hematocrit     Component Value Date/Time   HGB 11.2 (L) 01/13/2021 2113   HCT 36.1 (L) 01/13/2021 2113     Per Protocol for Patient with estCrcl < 30 ml/min and BMI < 40, will transition to Lovenox 30 mg Q24h.

## 2021-01-14 NOTE — Consult Note (Signed)
Pulmonary Medicine          Date: 01/14/2021,   MRN# [id number] Eric Hendricks January 15, 1924     AdmissionWeight: 65.1 kg                 CurrentWeight: 62.4 kg   Referring physician: Dr Bonner Puna   CHIEF COMPLAINT:   Acute on chronic hypoxemic respiratory failure with recurrent pleural effusion   HISTORY OF PRESENT ILLNESS   As per admission h/p this is a50 y.o. Caucasian male with medical history significant for systolic CHF, atrial fibrillation, Parkinson's disease, dyslipidemia, dementia and depression, who presented to emergency room after having an accidental fall with subsequent left forehead abrasion.  The patient apparently rolled from his bed while asleep and immediately woke up.  He denied any presyncope or syncope.  No chest pain or palpitations.  He had a small abrasion to the left shoulder as well.  No paresthesias or focal muscle weakness.  No dysuria, oliguria or hematuria or flank pain reported.  Is a fairly poor historian due to his dementia.  Per the patient's son he has apparently been followed by hospice at his SNF.   When he came to the ER heart rate was 115 with possibility of her percent on 4 L of O2 nasal cannula as his pulse oximetry was 88% on his baseline 3 L.  Vital signs were otherwise normal.  CMP was remarkable for a BUN of 44 and creatinine 1.57 compared to 36 and 1.42 on 12/18/2020.  BNP was 582.  CBC showed mild anemia close to baseline.  Respiratory panel is currently pending.  Head and C-spine CT showed no acute intracranial abnormalities and chronic small vessel ischemic disease with chronic atrophy and normal alignment of the C-spine with degenerative changes as well as bilateral pleural effusions CTA revealed large right pleural effusion with subtotal collapse of the right lung and small left pleural effusion, moderate coronary artery calcification, aortic atherosclerosis with no PE.  PCCM consultation placed for loculated right pleural effusion.  I  discussed case with Attending physician over phone after my evaluation of imaging and labs.  Patient is calm non tachypneic.  He diuresed well, he is COVID+ and had negative test last month so this seems to be new case.  He is on 3L [place] and with sPO2>95%    PAST MEDICAL HISTORY   Past Medical History:  Diagnosis Date  . Arrhythmia    atrial fibrillation  . B12 deficiency   . CHF (congestive heart failure) ([place])   . Dementia ([place])   . Depression   . Hyperlipemia   . Parkinson's disease ([place])   . Prostate cancer [facility])      SURGICAL HISTORY   Past Surgical History:  Procedure Laterality Date  . ANKLE SURGERY Right   . APPENDECTOMY    . CATARACT EXTRACTION Bilateral   . TRANSURETHRAL RESECTION OF PROSTATE       FAMILY HISTORY   Family History  Problem Relation Age of Onset  . Colon cancer Father   . Tremor Mother   . Parkinson's disease Brother   . Prostate cancer Brother   . Lung cancer Sister   . Emphysema Sister   . Heart failure Child   . Diabetes Child   . AAA (abdominal aortic aneurysm) Child      SOCIAL HISTORY   Social History   Tobacco Use  . Smoking status: Never Smoker  . Smokeless tobacco: Never Used  Vaping Use  .  Vaping Use: Never used  Substance Use Topics  . Alcohol use: No    Alcohol/week: 0.0 standard drinks  . Drug use: No     MEDICATIONS    Home Medication:    Current Medication:  Current Facility-Administered Medications:  .  acetaminophen (TYLENOL) tablet 650 mg, 650 mg, Oral, Q6H PRN **OR** acetaminophen (TYLENOL) suppository 650 mg, 650 mg, Rectal, Q6H PRN, Mansy, Jan A, MD .  ascorbic acid (VITAMIN C) tablet 500 mg, 500 mg, Oral, Daily, Mansy, Jan A, MD, 500 mg at 01/14/21 1020 .  atorvastatin (LIPITOR) tablet 80 mg, 80 mg, Oral, QPM, Mansy, Jan A, MD .  bacitracin ointment, , Topical, BID, Patrecia Pour, MD, Given at 01/14/21 1027 .  carbidopa-levodopa (SINEMET IR) 25-100 MG per tablet immediate release 1 tablet, 1  tablet, Oral, TID, Mansy, Jan A, MD, 1 tablet at 01/14/21 1210 .  divalproex (DEPAKOTE) DR tablet 250 mg, 250 mg, Oral, TID, Mansy, Jan A, MD, 250 mg at 01/14/21 1021 .  docusate sodium (COLACE) capsule 100 mg, 100 mg, Oral, BID PRN, Mansy, Jan A, MD .  donepezil (ARICEPT) tablet 10 mg, 10 mg, Oral, QHS, Mansy, Jan A, MD, 10 mg at 01/14/21 0329 .  enoxaparin (LOVENOX) injection 30 mg, 30 mg, Subcutaneous, Q24H, Mansy, Jan A, MD, 30 mg at 01/14/21 1020 .  famotidine (PEPCID) tablet 20 mg, 20 mg, Oral, BID, Mansy, Jan A, MD, 20 mg at 01/14/21 1020 .  furosemide (LASIX) injection 60 mg, 60 mg, Intravenous, BID, Mansy, Jan A, MD, 60 mg at 01/14/21 0851 .  magnesium hydroxide (MILK OF MAGNESIA) suspension 30 mL, 30 mL, Oral, Daily PRN, Mansy, Jan A, MD .  memantine [facility]) tablet 10 mg, 10 mg, Oral, BID, Mansy, Jan A, MD, 10 mg at 01/14/21 1021 .  metoprolol succinate (TOPROL-XL) 24 hr tablet 12.5 mg, 12.5 mg, Oral, Daily, Mansy, Jan A, MD .  mirtazapine (REMERON) tablet 15 mg, 15 mg, Oral, QHS, Mansy, Jan A, MD, 15 mg at 01/14/21 0327 .  ondansetron (ZOFRAN) tablet 4 mg, 4 mg, Oral, Q6H PRN **OR** ondansetron (ZOFRAN) injection 4 mg, 4 mg, Intravenous, Q6H PRN, Mansy, Jan A, MD .  polyvinyl alcohol (LIQUIFILM TEARS) 1.4 % ophthalmic solution 2 drop, 2 drop, Both Eyes, Q4H PRN, Mansy, Jan A, MD .  traZODone (DESYREL) tablet 25 mg, 25 mg, Oral, QHS PRN, Mansy, Jan A, MD    ALLERGIES   Phenylephrine-guaifenesin, Aspirin, Guaifenesin, Guanfacine, Phenylephrine, and Suprax [cefixime]     REVIEW OF SYSTEMS    Review of Systems:  Gen:  Denies  fever, sweats, chills weigh loss  HEENT: Denies blurred vision, double vision, ear pain, eye pain, hearing loss, nose bleeds, sore throat Cardiac:  No dizziness, chest pain or heaviness, chest tightness,edema Resp:   Denies cough or sputum porduction, shortness of breath,wheezing, hemoptysis,  Gi: Denies swallowing difficulty, stomach pain, nausea or  vomiting, diarrhea, constipation, bowel incontinence Gu:  Denies bladder incontinence, burning urine Ext:   Denies Joint pain, stiffness or swelling Skin: Denies  skin rash, easy bruising or bleeding or hives Endoc:  Denies polyuria, polydipsia , polyphagia or weight change Psych:   Denies depression, insomnia or hallucinations   Other:  All other systems negative   VS: BP 92/62 (BP Location: Right Arm)   Pulse 91   Temp 98.3 F (36.8 C) (Oral)   Resp (!) 22   Wt 62.4 kg   SpO2 93%   BMI 20.92 kg/m  PHYSICAL EXAM    GENERAL:NAD, no fevers, chills, no weakness no fatigue HEAD: Normocephalic, atraumatic.  EYES: Pupils equal, round, reactive to light. Extraocular muscles intact. No scleral icterus.  MOUTH: Moist mucosal membrane. Dentition intact. No abscess noted.  EAR, NOSE, THROAT: Clear without exudates. No external lesions.  NECK: Supple. No thyromegaly. No nodules. No JVD.  PULMONARY: Mild ronchorous breath sounds CARDIOVASCULAR: S1 and S2. Regular rate and rhythm. No murmurs, rubs, or gallops. No edema. Pedal pulses 2+ bilaterally.  GASTROINTESTINAL: Soft, nontender, nondistended. No masses. Positive bowel sounds. No hepatosplenomegaly.  MUSCULOSKELETAL: No swelling, clubbing, or edema. Range of motion full in all extremities.  NEUROLOGIC: Cranial nerves II through XII are intact. No gross focal neurological deficits. Sensation intact. Reflexes intact.  SKIN: No ulceration, lesions, rashes, or cyanosis. Skin warm and dry. Turgor intact.  PSYCHIATRIC: Mood, affect within normal limits. The patient is awake, alert and oriented x 3. Insight, judgment intact.       IMAGING    CT Head Wo Contrast  Result Date: 01/13/2021 CLINICAL DATA:  [place] with bruises to the forehead.  Neck trauma. EXAM: CT HEAD WITHOUT CONTRAST CT CERVICAL SPINE WITHOUT CONTRAST TECHNIQUE: Multidetector CT imaging of the head and cervical spine was performed following the standard protocol  without intravenous contrast. Multiplanar CT image reconstructions of the cervical spine were also generated. COMPARISON:  CT head and cervical spine 07/31/2018 FINDINGS: CT HEAD FINDINGS Brain: No evidence of acute infarction, hemorrhage, hydrocephalus, extra-axial collection or mass lesion/mass effect. Diffuse cerebral atrophy. Ventricular dilatation likely due to central atrophy. Patchy low-attenuation changes in the deep white matter consistent small vessel ischemia. Vascular: Moderate intracranial arterial vascular calcifications. Skull: Calvarium appears intact. Sinuses/Orbits: Paranasal sinuses and mastoid air cells are clear. Other: None. CT CERVICAL SPINE FINDINGS Alignment: Normal alignment Skull base and vertebrae: No acute fracture. No primary bone lesion or focal pathologic process. Soft tissues and spinal canal: No prevertebral fluid or swelling. No visible canal hematoma. Disc levels: Degenerative changes with narrowed interspaces and endplate hypertrophic change most prominent at C5-6 and C6-7 levels. Prominent ligamentous calcifications. Degenerative changes in the facet joints. Upper chest: Bilateral pleural effusions are demonstrated in the apices mid Other: Spur calcifications IMPRESSION: 1. No acute intracranial abnormalities. Chronic atrophy and small vessel ischemic changes. 2. Normal alignment of the cervical spine. Degenerative changes. No acute displaced fractures identified. 3. Bilateral pleural effusions. Electronically Signed   By: Burman Nieves M.D.   On: 01/13/2021 22:06   CT ANGIO CHEST PE W OR WO CONTRAST  Result Date: 12/15/2020 CLINICAL DATA:  Elevated D-dimer, dyspnea EXAM: CT ANGIOGRAPHY CHEST WITH CONTRAST TECHNIQUE: Multidetector CT imaging of the chest was performed using the standard protocol during bolus administration of intravenous contrast. Multiplanar CT image reconstructions and MIPs were obtained to evaluate the vascular anatomy. CONTRAST:  32mL OMNIPAQUE  IOHEXOL 350 MG/ML SOLN COMPARISON:  12/13/2018 FINDINGS: Cardiovascular: There is adequate opacification of the pulmonary arterial tree. There is no intraluminal filling defect identified to suggest acute pulmonary embolism. There is relative hypoenhancement of the right pulmonary arterial tree, likely related to vascular shunting secondary to subtotal collapse of the right lung. The central pulmonary arteries are of normal caliber. Moderate multi-vessel coronary artery calcification with possible stenting of the left anterior descending coronary artery. Global cardiac size is within normal limits. Left subclavian pacemaker leads are seen within the right atrium and right ventricle toward the apex. No pericardial effusion. Moderate atherosclerotic calcification within the thoracic aorta. No aortic aneurysm. Mediastinum/Nodes: No  pathologic thoracic adenopathy. Visualized thyroid is unremarkable. Esophagus is unremarkable. Lungs/Pleura: There is near complete collapse of the right lung secondary to compressive atelectasis of a large right pleural effusion. The right middle and lower lobes are completely collapsed. There is partial aeration of the right upper lobe. No central obstructing mass is identified. The costophrenic angle is excluded from view on this examination. Small left pleural effusion is present with mild compressive atelectasis of the left lower lobe. No superimposed focal pulmonary infiltrate. No pneumothorax. Upper Abdomen: No acute abnormality within the visualized upper abdomen. Musculoskeletal: No acute bone abnormality. No lytic or blastic bone lesion identified. Review of the MIP images confirms the above findings. IMPRESSION: No pulmonary embolism. Large right pleural effusion with subtotal collapse of the right lung. Small left pleural effusion also identified. Moderate coronary artery calcification. Aortic Atherosclerosis (ICD10-I70.0). Electronically Signed   By: Fidela Salisbury MD   On:  12/15/2020 23:25   CT Cervical Spine Wo Contrast  Result Date: 01/13/2021 CLINICAL DATA:  [place] with bruises to the forehead.  Neck trauma. EXAM: CT HEAD WITHOUT CONTRAST CT CERVICAL SPINE WITHOUT CONTRAST TECHNIQUE: Multidetector CT imaging of the head and cervical spine was performed following the standard protocol without intravenous contrast. Multiplanar CT image reconstructions of the cervical spine were also generated. COMPARISON:  CT head and cervical spine 07/31/2018 FINDINGS: CT HEAD FINDINGS Brain: No evidence of acute infarction, hemorrhage, hydrocephalus, extra-axial collection or mass lesion/mass effect. Diffuse cerebral atrophy. Ventricular dilatation likely due to central atrophy. Patchy low-attenuation changes in the deep white matter consistent small vessel ischemia. Vascular: Moderate intracranial arterial vascular calcifications. Skull: Calvarium appears intact. Sinuses/Orbits: Paranasal sinuses and mastoid air cells are clear. Other: None. CT CERVICAL SPINE FINDINGS Alignment: Normal alignment Skull base and vertebrae: No acute fracture. No primary bone lesion or focal pathologic process. Soft tissues and spinal canal: No prevertebral fluid or swelling. No visible canal hematoma. Disc levels: Degenerative changes with narrowed interspaces and endplate hypertrophic change most prominent at C5-6 and C6-7 levels. Prominent ligamentous calcifications. Degenerative changes in the facet joints. Upper chest: Bilateral pleural effusions are demonstrated in the apices mid Other: Spur calcifications IMPRESSION: 1. No acute intracranial abnormalities. Chronic atrophy and small vessel ischemic changes. 2. Normal alignment of the cervical spine. Degenerative changes. No acute displaced fractures identified. 3. Bilateral pleural effusions. Electronically Signed   By: Lucienne Capers M.D.   On: 01/13/2021 22:06   DG Chest Port 1 View  Result Date: 01/13/2021 CLINICAL DATA:  Cough and shortness of  breath.  Weakness and fall. EXAM: PORTABLE CHEST 1 VIEW COMPARISON:  12/19/2020 FINDINGS: Cardiac pacemaker. Shallow inspiration. Heart size is obscured but appears enlarged. Bilateral perihilar infiltration/edema. Moderate right pleural effusion. Progression of changes since previous study. Calcification of the aorta. IMPRESSION: Progression of bilateral perihilar infiltration/edema and right pleural effusion. Electronically Signed   By: Lucienne Capers M.D.   On: 01/13/2021 21:28   DG Chest Port 1 View  Result Date: 12/19/2020 CLINICAL DATA:  Recurrent right pleural effusion and status post thoracentesis. EXAM: PORTABLE CHEST 1 VIEW COMPARISON:  From earlier today at 1126 hours FINDINGS: Stable heart size and appearance of dual-chamber pacemaker. Decrease in pleural fluid since the prior chest x-ray with some residual partially loculated fluid remaining. No pneumothorax. Improved aeration of the right lung. IMPRESSION: Decrease in right pleural fluid after thoracentesis without pneumothorax. Improved aeration of the right lung. Electronically Signed   By: Aletta Edouard M.D.   On: 12/19/2020 16:29   DG  Chest Port 1 View  Result Date: 12/19/2020 CLINICAL DATA:  Dyspnea EXAM: PORTABLE CHEST 1 VIEW COMPARISON:  12/16/2020 FINDINGS: Dual lead pacer. Patient rotated right. Midline trachea. Mild cardiomegaly. Atherosclerosis in the transverse aorta. Moderate to large right pleural effusion with suggestion of loculation laterally, increased. No left pleural effusion. No pneumothorax. Mild pulmonary venous congestion. Increased right and similar left base airspace disease. IMPRESSION: Increase in moderate to large right pleural effusion with suggestion of loculation laterally. Adjacent right base airspace disease which could represent atelectasis or infection. Similar left base Airspace disease, likely atelectasis. Aortic Atherosclerosis (ICD10-I70.0). Electronically Signed   By: Abigail Miyamoto M.D.   On: 12/19/2020  11:48   DG Chest Port 1 View  Result Date: 12/16/2020 CLINICAL DATA:  Pleural effusion status post right thoracentesis EXAM: PORTABLE CHEST 1 VIEW COMPARISON:  12/16/2020 FINDINGS: Single frontal view of the chest demonstrates decreased right pleural effusion after thoracentesis, with no evidence of pneumothorax. Moderate residual loculated right pleural effusion with continued areas of consolidation at the right lung base. Left chest is clear. Cardiac silhouette is stable. Dual lead pacemaker unchanged. IMPRESSION: 1. Moderate residual loculated right pleural effusion after thoracentesis. No evidence of pneumothorax. Electronically Signed   By: Randa Ngo M.D.   On: 12/16/2020 15:45   DG Chest Port 1 View  Result Date: 12/16/2020 CLINICAL DATA:  Pleural effusion and shortness of breath. EXAM: PORTABLE CHEST 1 VIEW COMPARISON:  12/15/2020 FINDINGS: Left chest wall pacer device is noted with leads in the right atrial appendage and right ventricle. Heart size is normal. Further increase in volume of right pleural effusion with near complete opacification of the right hemithorax. Increase interstitial markings in the left lung compatible with interstitial edema. IMPRESSION: Increase in volume of right pleural effusion with near complete opacification of the right hemithorax. Electronically Signed   By: Kerby Moors M.D.   On: 12/16/2020 12:52   US THORACENTESIS ASP PLEURAL SPACE W/IMG GUIDE  Result Date: 12/19/2020 INDICATION: Patient with a history of CHF and recurrent pleural effusions. Interventional radiology asked to perform a therapeutic thoracentesis. EXAM: ULTRASOUND GUIDED THORACENTESIS MEDICATIONS: 1% lidocaine 10 mL COMPLICATIONS: None immediate. PROCEDURE: An ultrasound guided thoracentesis was thoroughly discussed with the patient and questions answered. The benefits, risks, alternatives and complications were also discussed. The patient understands and wishes to proceed with the  procedure. Written consent was obtained. Ultrasound was performed to localize and mark an adequate pocket of fluid in the right chest. The area was then prepped and draped in the normal sterile fashion. 1% Lidocaine was used for local anesthesia. Under ultrasound guidance a 6 Fr Safe-T-Centesis catheter was introduced. Thoracentesis was performed. The catheter was removed and a dressing applied. FINDINGS: A total of approximately 1.1 L of bloody fluid was removed. IMPRESSION: Successful ultrasound guided right thoracentesis yielding 0.1 L of pleural fluid. Read by: Soyla Dryer, NP Electronically Signed   By: Aletta Edouard M.D.   On: 12/19/2020 16:29   US THORACENTESIS ASP PLEURAL SPACE W/IMG GUIDE  Result Date: 12/16/2020 CLINICAL DATA:  Recurrent right pleural effusion and status post prior thoracentesis procedures. The most recent is dated 11/04/2020. EXAM: ULTRASOUND GUIDED RIGHT THORACENTESIS COMPARISON:  None. PROCEDURE: An ultrasound guided thoracentesis was thoroughly discussed with the patient and questions answered. The benefits, risks, alternatives and complications were also discussed. The patient understands and wishes to proceed with the procedure. Written consent was obtained. Ultrasound was performed to localize and mark an adequate pocket of fluid in the right chest.  The area was then prepped and draped in the normal sterile fashion. 1% Lidocaine was used for local anesthesia. Under ultrasound guidance a 6 French Safe-T-Centesis catheter was introduced. Thoracentesis was performed. The catheter was removed and a dressing applied. COMPLICATIONS: None FINDINGS: A total of approximately 2 L of grossly bloody fluid was removed. Some fluid remain present but the procedure had to be halted due to development of significant right chest pain during evacuation. A fluid sample was sent for laboratory analysis. IMPRESSION: Successful ultrasound guided right thoracentesis yielding 2 L of bloody pleural  fluid. Electronically Signed   By: Aletta Edouard M.D.   On: 12/16/2020 15:33      ASSESSMENT/PLAN   Acute on chronic hypoxemic respiratory failure Acute COVID19 pneumonia -Remdesevir antiviral - pharmacy protocol 5 d -vitamin C -zinc -decadron 4 mg IV daily  -Diuresis - Lasix 60 IV - monitor UOP - utilize external urinary catheter if possible -No need to  Self prone -encourage to use IS and Acapella device for bronchopulmonary hygiene when able -d/c hepatotoxic medications while on remdesevir -supportive care with ICU telemetry monitoring -PT/OT when possible -procalcitonin, CRP, ddimer and ferritin trending   Acute on chronic HFrEF 30-35%  - cardiology on case appreciate input  - continuing diuresis - 1200cc uop overnight -he is malnourished and has low albumin , will add once daily IV albumin while on lasix to potentiate diuresis  -continue statins -PT/OT - high fall risk - no anticoagulation    Right pleural effusion   - no signs of bacterial pneumonia and low suspicion for para-pneumonic effusion at this time due to absence of leukocytosis, fevers, bilateral infiltrates in context of viral pneumonia induced CHF exacerbation -agree with diuresis and medical management at this time -previous thoracentesis with 2L of blood -please order fluid studies on thoracentesis  -there is a loculated component to effusion which complicates things and may require chest tube after all.  I suspect this may be due to previous bloody aspirate on thoracentesis. -He has CKD which makes diuresis more complicated.  Overall due to age and DNR status I prefer conservative approach. First we should do diagnostic thoracentesis with all fluid studies sent off for cytology and microbiology.  I will also ask palliative to consult due to possibly needing a chest tube which may be too aggressive for this centurian.      Thank you for allowing me to participate in the care of this patient.      Patient/Family are satisfied with care plan and all questions have been answered.  This document was prepared using Dragon voice recognition software and may include unintentional dictation errors.     Ottie Glazier, M.D.  Division of [place]

## 2021-01-14 NOTE — Evaluation (Signed)
Occupational Therapy Evaluation Patient Details Name: Eric Hendricks MRN: [id number] DOB: 10-05-1923 Today's Date: 01/14/2021    History of Present Illness Pt is a 87 y/o M with PMH: parkinson's disease, dementia, CAD, sCHF, and depression.  Pt just recently admitted for recurrent pleural effusion. Presents this time from [facility] s/p fall. Found to have COVID-19.   Clinical Impression   Pt seen for OT evaluation this date in setting of acute hospitalization s/p fall from bed (reports rolling out when found in assisted living facility). Pt reports being able to perform most basic self care at baseline including toileting, bathing and dressing. States he was walking with RW. Most PLOF information confirmed with [facility] on last admission except facility reports that he was needing some assist to perform bathing as well. Pt presents this date with decreased standing tolerance and balance as well as decreased general functional activity tolerance limiting his abiltiy to even perform LB ADLs in sitting. On assessment, pt requires MIN/MOD A to perform LB ADLs in sitting, MIN A for transfers, SETUP to MIN A for seated UB ADLs. Pt tolerates bathing tasks and is transferred to chair with chair alarm. All needs met and in reach. RN notified of pt de-satting to 85% with activity on 3Lnc requiring 30 second seated rest break with PLB. Anticipate pt can return to [facility] with 24/7 SUPV and resuming occupational therapy services.     Follow Up Recommendations  Home health OT;Supervision/Assistance - 24 hour    Equipment Recommendations  3 in 1 bedside commode    Recommendations for Other Services       Precautions / Restrictions Precautions Precautions: Fall Restrictions Weight Bearing Restrictions: No Other Position/Activity Restrictions: monitor BP and O2      Mobility Bed Mobility Overal bed mobility: Modified Independent             General bed mobility comments: increased  time, HOB elevated    Transfers Overall transfer level: Needs assistance Equipment used: Rolling walker (2 wheeled) Transfers: Sit to/from [facility] Sit to Stand: Min guard;Min assist Stand pivot transfers: Min assist       General transfer comment: increased time, cues for sequence    Balance Overall balance assessment: Needs assistance Sitting-balance support: Feet supported Sitting balance-Leahy Scale: Good     Standing balance support: Bilateral upper extremity supported Standing balance-Leahy Scale: Fair Standing balance comment: MIN A and UE support on RW for standing.                           ADL either performed or assessed with clinical judgement   ADL Overall ADL's : Needs assistance/impaired                                       General ADL Comments: MIN/MOD A to perform LB ADLs in sitting, MIN A for transfers, SETUP to MIN A for seated UB ADLs.     Vision Patient Visual Report: No change from baseline       Perception     Praxis      Pertinent Vitals/Pain Pain Assessment: No/denies pain     Hand Dominance Right   Extremity/Trunk Assessment Upper Extremity Assessment Upper Extremity Assessment: Generalized weakness;Overall WFL for tasks assessed (ROM WFL, MMT grossly 4-/5)   Lower Extremity Assessment Lower Extremity Assessment: Generalized weakness;Overall [facility] for tasks assessed  Communication Communication Communication: HOH   Cognition Arousal/Alertness: Awake/alert Behavior During Therapy: WFL for tasks assessed/performed Overall Cognitive Status: Difficult to assess                                 General Comments: able to follow all simple one step commands   General Comments  Pt's BP noted to be low throughout. Pt with SBP in 90s, DBP in low 70s/upper60s. On 3Lnc at baseline, sats >92% at rest, de-sats to 85-86% with activity/transfers requiring 4Lnc and ~30 seconds of  seated rest with PLB to recoup. RN notified.    Exercises Other Exercises Other Exercises: Ed re: role of OT, use of call button, importance of OOB activity, safety considerations. Pt with MIN/MOD reception, difficult to guage given [place].   Shoulder Instructions      Home Living Family/patient expects to be discharged to:: Assisted living ([facility])                             Home Equipment: Gilford Rile - 2 wheels;Shower seat          Prior Functioning/Environment Level of Independence: Needs assistance  Gait / Transfers Assistance Needed: Pt reports using RW for fxl mobility in the ALF. Pt is HOH and somewhat confused, but [place] confirmed via telephone to OT last admission that pt does use  RW. ADL's / Homemaking Assistance Needed: pt reports being INDEP for bathing, dressing, and toileting. States facility assists with meals and meds. [facility] reports that pt was needing assist for bathing occasionally when called on last admission in March 2021            OT Problem List: Decreased strength;Decreased activity tolerance;Impaired balance (sitting and/or standing);Decreased safety awareness;Cardiopulmonary status limiting activity      OT Treatment/Interventions: Self-care/ADL training;DME and/or AE instruction;Therapeutic activities;Balance training;Therapeutic exercise;Energy conservation;Patient/family education    OT Goals(Current goals can be found in the care plan section) Acute Rehab OT Goals Patient Stated Goal: to get stronger OT Goal Formulation: With patient Time For Goal Achievement: 01/28/21 Potential to Achieve Goals: Good ADL Goals Pt Will Perform Lower Body Bathing: with set-up;sitting/lateral leans Pt Will Perform Lower Body Dressing: with set-up;sit to/from stand (with LRAD/AE PRN) Pt Will Transfer to Toilet: with min guard assist;with supervision;ambulating;bedside commode (with LRAD to BSC ~10' away to increase tolerance for fxl  distances) Pt Will Perform Toileting - Clothing Manipulation and hygiene: with supervision;with min guard assist;sit to/from stand Pt/caregiver will Perform Home Exercise Program: Increased strength;Both right and left upper extremity;With Supervision  OT Frequency: Min 1X/week   Barriers to D/C:            Co-evaluation              AM-PAC OT "6 Clicks" Daily Activity     Outcome Measure Help from another person eating meals?: None Help from another person taking care of personal grooming?: None Help from another person toileting, which includes using toliet, bedpan, or urinal?: A Lot Help from another person bathing (including washing, rinsing, drying)?: A Lot Help from another person to put on and taking off regular upper body clothing?: A Little Help from another person to put on and taking off regular lower body clothing?: A Lot 6 Click Score: 17   End of Session Equipment Utilized During Treatment: Gait belt;Rolling walker;Oxygen (3Lnc at baseline) Nurse Communication: Mobility status  Activity Tolerance: Patient tolerated treatment well Patient left: in chair;with call bell/phone within reach;with chair alarm set  OT Visit Diagnosis: Unsteadiness on feet (R26.81);Muscle weakness (generalized) (M62.81);History of falling (Z91.81)                Time: 1132-1209 OT Time Calculation (min): 37 min Charges:  OT General Charges $OT Visit: 1 Visit OT Evaluation $OT Eval Moderate Complexity: 1 Mod OT Treatments $Self Care/Home Management : 8-22 mins $Therapeutic Activity: 8-22 mins  Gerrianne Scale, MS, OTR/L ascom [phone] 01/14/21, 1:57 PM

## 2021-01-15 ENCOUNTER — Inpatient Hospital Stay: Payer: Medicare Other

## 2021-01-15 DIAGNOSIS — Z515 Encounter for palliative care: Secondary | ICD-10-CM

## 2021-01-15 DIAGNOSIS — Z7189 Other specified counseling: Secondary | ICD-10-CM

## 2021-01-15 DIAGNOSIS — J90 Pleural effusion, not elsewhere classified: Secondary | ICD-10-CM

## 2021-01-15 DIAGNOSIS — I5021 Acute systolic (congestive) heart failure: Secondary | ICD-10-CM | POA: Diagnosis not present

## 2021-01-15 DIAGNOSIS — Z66 Do not resuscitate: Secondary | ICD-10-CM

## 2021-01-15 LAB — COMPREHENSIVE METABOLIC PANEL
ALT: 9 U/L (ref 0–44)
AST: 20 U/L (ref 15–41)
Albumin: 2.5 g/dL — ABNORMAL LOW (ref 3.5–5.0)
Alkaline Phosphatase: 64 U/L (ref 38–126)
Anion gap: 12 (ref 5–15)
BUN: 35 mg/dL — ABNORMAL HIGH (ref 8–23)
CO2: 31 mmol/L (ref 22–32)
Calcium: 8 mg/dL — ABNORMAL LOW (ref 8.9–10.3)
Chloride: 99 mmol/L (ref 98–111)
Creatinine, Ser: 1.41 mg/dL — ABNORMAL HIGH (ref 0.61–1.24)
GFR, Estimated: 46 mL/min — ABNORMAL LOW (ref >60)
Glucose, Bld: 96 mg/dL (ref 70–99)
Potassium: 3.7 mmol/L (ref 3.5–5.1)
Sodium: 142 mmol/L (ref 135–145)
Total Bilirubin: 0.7 mg/dL (ref 0.3–1.2)
Total Protein: 6.3 g/dL — ABNORMAL LOW (ref 6.5–8.1)

## 2021-01-15 LAB — CBC WITH DIFFERENTIAL/PLATELET
Abs Immature Granulocytes: 0.04 10*3/uL (ref 0.00–0.07)
Basophils Absolute: 0 10*3/uL (ref 0.0–0.1)
Basophils Relative: 0 %
Eosinophils Absolute: 0.1 10*3/uL (ref 0.0–0.5)
Eosinophils Relative: 1 %
HCT: 35.3 % — ABNORMAL LOW (ref 39.0–52.0)
Hemoglobin: 10.7 g/dL — ABNORMAL LOW (ref 13.0–17.0)
Immature Granulocytes: 1 %
Lymphocytes Relative: 5 %
Lymphs Abs: 0.2 10*3/uL — ABNORMAL LOW (ref 0.7–4.0)
MCH: 29.7 pg (ref 26.0–34.0)
MCHC: 30.3 g/dL (ref 30.0–36.0)
MCV: 98.1 fL (ref 80.0–100.0)
Monocytes Absolute: 0.5 10*3/uL (ref 0.1–1.0)
Monocytes Relative: 10 %
Neutro Abs: 4.3 10*3/uL (ref 1.7–7.7)
Neutrophils Relative %: 83 %
Platelets: 209 10*3/uL (ref 150–400)
RBC: 3.6 MIL/uL — ABNORMAL LOW (ref 4.22–5.81)
RDW: 18.4 % — ABNORMAL HIGH (ref 11.5–15.5)
WBC: 5.1 10*3/uL (ref 4.0–10.5)
nRBC: 0 % (ref 0.0–0.2)

## 2021-01-15 LAB — C-REACTIVE PROTEIN: CRP: 3.9 mg/dL — ABNORMAL HIGH (ref <1.0)

## 2021-01-15 LAB — T4, FREE: Free T4: 0.9 ng/dL (ref 0.61–1.12)

## 2021-01-15 LAB — D-DIMER, QUANTITATIVE: D-Dimer, Quant: 3.88 ug/mL-FEU — ABNORMAL HIGH (ref 0.00–0.50)

## 2021-01-15 MED ORDER — ALBUMIN HUMAN 25 % IV SOLN
12.5000 g | Freq: Once | INTRAVENOUS | Status: AC
  Administered 2021-01-15: 12.5 g via INTRAVENOUS
  Filled 2021-01-15: qty 50

## 2021-01-15 MED ORDER — DEXAMETHASONE SODIUM PHOSPHATE 10 MG/ML IJ SOLN
4.0000 mg | Freq: Two times a day (BID) | INTRAMUSCULAR | Status: DC
  Administered 2021-01-15 – 2021-01-18 (×7): 4 mg via INTRAVENOUS
  Filled 2021-01-15 (×7): qty 1

## 2021-01-15 NOTE — Consult Note (Signed)
Consultation Note Date: 01/15/2021   Patient Name: Eric Hendricks  DOB: [birth year]  MRN: [id number]  Age / Sex: 87 y.o., male  PCP: [name], MD Referring Physician: Patrecia Pour, MD  Reason for Consultation: Establishing goals of care  HPI/Patient Profile: 87 y.o. male  with past medical history of systolic CHF, atrial fibrillation, Parkinson's disease, dyslipidemia, dementia and depression admitted on 01/13/2021 with fall and forehead abrasion. Patient diagnosed with acute CHF.  Found to have recurrent R pleural effusion - IR reports loculations on ultrasound during last attempt, considering chest tube. Also being treated for Covid-19 pna. PMT consulted to discuss [place].   Clinical Assessment and Goals of Care: I have reviewed medical records including EPIC notes, labs and imaging, received report from Dr. Lanney Gins, then spoke with son Eric Hendricks to discuss diagnosis prognosis, [place], EOL wishes, disposition and options.  I introduced Palliative Medicine as specialized medical care for people living with serious illness. It focuses on providing relief from the symptoms and stress of a serious illness. The goal is to improve quality of life for both the patient and the family.  Eric Hendricks tells me he saw patient a couple of days ago and patient appeared very frail and weak. Tells me he uses a walker for ambulation but is very slow. Tells of difficulties with PO intake d/t difficulty chewing/missing teeth/dentures. He does not like texture of pureed food. Son tells me patient is frequently confused.    We discussed patient's current illness and what it means in the larger context of patient's on-going co-morbidities.  We discussed his CHF exacerbation, COVID infections, and R pleural effusion with loculated component. Discussed possible need for chest tube.   I attempted to elicit values and goals of care important to the patient.    Son shares he would  like to move forward with treatment options offered including chest tube "if it will help him". He does ask to speak to pulmonologist further. This was relayed to Dr. Lanney Gins.   Discussed with family the importance of continued conversation with family and the medical providers regarding overall plan of care and treatment options, ensuring decisions are within the context of the patient's values and GOCs.    Son shares patient was receiving what he believes was hospice services at facility prior to admission. He tells me he understood goal of hospice to focus on comfort and quality of life as patient nears end of life.   Questions and concerns were addressed. The family was encouraged to call with questions or concerns.   Primary Decision Maker HCPOA - son Eric Hendricks  SUMMARY OF RECOMMENDATIONS   - son interested in pursuing chest tube/asks to speak to pulmonologist - I confirmed with [facility] that he is a patient of theirs and they plan to resume services at his discharge  Code Status/Advance Care Planning:  DNR  Discharge Planning: To Be Determined      Primary Diagnoses: Present on Admission: **None**   I have reviewed the medical record, interviewed the patient and family, and examined the patient. The following aspects are pertinent.  Past Medical History:  Diagnosis Date  . Arrhythmia    atrial fibrillation  . B12 deficiency   . CHF (congestive heart failure) ([place])   . Dementia ([place])   . Depression   . Hyperlipemia   . Parkinson's disease ([place])   . Prostate cancer [facility])    Social History   Socioeconomic History  . Marital status: Widowed  Spouse name: Not on file  . Number of children: 3  . Years of education: Not on file  . Highest education level: Some college, no degree  Occupational History  . Not on file  Tobacco Use  . Smoking status: Never Smoker  . Smokeless tobacco: Never Used  Vaping Use  . Vaping Use: Never used  Substance and  Sexual Activity  . Alcohol use: No    Alcohol/week: 0.0 standard drinks  . Drug use: No  . Sexual activity: Not on file  Other Topics Concern  . Not on file  Social History Narrative   Pt lives at [place]   Right handed   Pt has 3 children   He drinks some coffee, tea, mostly milk.    Social Determinants of Health   Financial Resource Strain: Not on file  Food Insecurity: Not on file  Transportation Needs: Not on file  Physical Activity: Not on file  Stress: Not on file  Social Connections: Not on file   Family History  Problem Relation Age of Onset  . Colon cancer Father   . Tremor Mother   . Parkinson's disease Brother   . Prostate cancer Brother   . Lung cancer Sister   . Emphysema Sister   . Heart failure Child   . Diabetes Child   . AAA (abdominal aortic aneurysm) Child    Scheduled Meds: . ascorbic acid  500 mg Oral Daily  . atorvastatin  80 mg Oral QPM  . bacitracin   Topical BID  . carbidopa-levodopa  1 tablet Oral TID  . dexamethasone (DECADRON) injection  4 mg Intravenous Q12H  . divalproex  250 mg Oral TID  . donepezil  10 mg Oral QHS  . enoxaparin (LOVENOX) injection  30 mg Subcutaneous Q24H  . famotidine  20 mg Oral BID  . furosemide  60 mg Intravenous BID  . memantine  10 mg Oral BID  . metoprolol succinate  12.5 mg Oral Daily  . mirtazapine  15 mg Oral QHS   Continuous Infusions: . remdesivir 100 mg in NS 100 mL     PRN Meds:.acetaminophen **OR** acetaminophen, docusate sodium, magnesium hydroxide, ondansetron **OR** ondansetron (ZOFRAN) IV, polyvinyl alcohol, traZODone Allergies  Allergen Reactions  . Phenylephrine-Guaifenesin Palpitations  . Aspirin Other (See Comments)  . Guaifenesin Other (See Comments)    Unknown reaction  . Guanfacine   . Phenylephrine   . Suprax [Cefixime]    Vital Signs: BP 101/70 (BP Location: Left Arm)   Pulse 97   Temp 98 F (36.7 C) (Oral)   Resp 20   Wt 62.4 kg   SpO2 94%    BMI 20.92 kg/m  Pain Scale: 0-10   Pain Score: 0-No pain   SpO2: SpO2: 94 % O2 Device:SpO2: 94 % O2 Flow Rate: .O2 Flow Rate (L/min): 3 L/min  IO: Intake/output summary:   Intake/Output Summary (Last 24 hours) at 01/15/2021 1226 Last data filed at 01/14/2021 1700 Gross per 24 hour  Intake 500 ml  Output 900 ml  Net -400 ml    LBM: Last BM Date: 01/14/21 Baseline Weight: Weight: 65.1 kg Most recent weight: Weight: 62.4 kg     Palliative Assessment/Data: PPS 50%    Time Total: 55 minutes Greater than 50%  of this time was spent counseling and coordinating care related to the above assessment and plan.  The above conversation was completed via telephone due to the visitor restrictions during the COVID-19 pandemic. Thorough chart  review and discussion with necessary members of the care team was completed as part of assessment. All issues were discussed and addressed but no physical exam was performed.  Juel Burrow, DNP, AGNP-C Palliative Medicine Team [phone] Pager: [phone]

## 2021-01-15 NOTE — Progress Notes (Signed)
PROGRESS NOTE  Eric Hendricks  HMC:[id number] DOB: 1924/04/28 DOA: 01/13/2021 PCP: Marina Goodell, MD   Brief Narrative: Eric Hendricks is a 87 y.o. male with a history of 3L oxygen-dependent COPD, chronic HFrEF, Parkinson's disease, AFib, dementia, depression, HLD who presented to the ED after falling out of his bed at home with resultant left forehead abrasion. In the ED he was tachycardic with oxygen requirement above baseline at 4L O2. BNP 582. ECG with AFib with RVR. CT cervical spine and head without acute fracture, dislocation or hemorrhage. CTA chest revealed bilateral perihilar infiltrates/edema and right >> left pleural effusion. IR consulted for repeat thoracentesis and cardiology consulted for assistance with diuresis. Covid-19 screening PCR was positive. CRP 3.2, PCT <0.10, d-dimer 5.66. Remdesivir, decadron, IV lasix were started. Pulmonary consulted for consideration of chest tube. Palliative also consulted to navigate goals of care decisions as the patient has been followed by hospice PTA.  Assessment & Plan: Active Problems:   Acute CHF (congestive heart failure) (HCC)  Recurrent right > left pleural effusion: D/w IR who reports loculations on U/S during last attempt, suspects chest tube may be necessary. PCT negative.  - PCCM consulted. Plan to Tx CHF and covid, they request thoracentesis for cytology and microbiology studies.   Acute on chronic HFrEF: Echo Aug 2021 w/LVEF 30-35%, mod AS.  - Cardiology consulted, giving lasix IV with good diuresis. CXR repeated showing improvement in airspace opacities confirming edema. - PCCM adding albumin to potentiate diuresis.  - Monitor I/O, daily weights, metabolic panel.  - With soft BPs this AM, will hold entresto until cardiology evaluation. Of course will plan to be added back as BP and renal function allows.   Covid-19 pneumonia:  - Isolation x10 days. - Initiated remdesivir x5 days. CRP modestly elevated  - Continue decadron  per PCCM - D-dimer showing improvement with typical Tx and no anticoagulation. Will not start anticoagulation with prohibitively elevated fall risk and risk for ICH.  PAF with RVR:  - Per cardiology, continue metoprolol, consider DCing amiodarone as pt has not maintained rhythm control. - Avoiding anticoagulation with high fall risk  Stage IIIa CKD: Putative Dx.  - Avoid nephrotoxins if able.  CAD, HLD: +Moderate calcifications on CTA chest from March. No chest pain, cardiology does not suspect ACS. - Beta blocker   - Holding ASA with thoracentesis requested and history of bloody output. - Continue high-intensity statin  Parkinson's disease, dementia, depression:  - Continue sinemet - Continue aricept, namenda, remeron, depakote.   Fall at home: No lacerations requiring repair in ED, negative trauma C spine/CT head.  - PT, OT. Continues to require SNF.  Elevated TSH: 9.763, though free T4 wnl.  - No further work up currently planned.  DVT prophylaxis: SCDs Code Status: DNR Family Communication: None at bedside. Palliative and PCCM discussing care with Son. Disposition Plan:  Status is: Inpatient  Remains inpatient appropriate because:Inpatient level of care appropriate due to severity of illness   Dispo: The patient is from: SNF              Anticipated d/c is to: SNF              Patient currently is not medically stable to d/c.   Difficult to place patient No  Consultants:   PCCM  Cardiology  Palliative care medicine team  IR  Procedures:   Thoracentesis requested.  Antimicrobials:  Remdesivir 4/27 >>   Subjective: Very HOH, reports his breathing is bad, worse with any  exertion, no better since admission. Also has reports of right sided chest pain worse with some movements.   Objective: Vitals:   01/15/21 1231 01/15/21 1247 01/15/21 1531 01/15/21 1544  BP: 103/68   109/72  Pulse: (!) 123 100  (!) 109  Resp: (!) 24 20  18   Temp: 98.2 F (36.8  C)   98.8 F (37.1 C)  TempSrc: Oral   Axillary  SpO2:  92% (!) 89% 95%  Weight:        Intake/Output Summary (Last 24 hours) at 01/15/2021 1656 Last data filed at 01/15/2021 1549 Gross per 24 hour  Intake 340 ml  Output 1200 ml  Net -860 ml   Filed Weights   01/14/21 0158 01/14/21 0903  Weight: 65.1 kg 62.4 kg    Gen: Frail elderly male in no distress. Pulm: Tachypneic with crackles, diffusely diminished R> L, no wheezes.  CV: Irreg irreg, tachycardic with any exertion, even while eating. Trace edema.  GI: Abdomen soft, non-tender, non-distended, with normoactive bowel sounds. No organomegaly or masses felt. Ext: Warm, no deformities Skin: No rashes, lesions or ulcers on visualized skin Neuro: Alert, exam limited by diffuse but no focal weakness and HOH. Psych: Mood & affect appropriate.   Data Reviewed: I have personally reviewed following labs and imaging studies  CBC: Recent Labs  Lab 01/13/21 2113 01/14/21 0512 01/15/21 0631  WBC 6.8 5.0 5.1  NEUTROABS 6.1  --  4.3  HGB 11.2* 10.1* 10.7*  HCT 36.1* 32.5* 35.3*  MCV 97.3 96.4 98.1  PLT 220 192 161   Basic Metabolic Panel: Recent Labs  Lab 01/13/21 2113 01/13/21 2116 01/14/21 0512 01/15/21 0631  NA 139  --  140 142  K 4.0  --  3.7 3.7  CL 101  --  101 99  CO2 26  --  29 31  GLUCOSE 115*  --  104* 96  BUN 44*  --  42* 35*  CREATININE 1.57*  --  1.43* 1.41*  CALCIUM 8.3*  --  8.0* 8.0*  MG  --  2.6*  --   --    GFR: Estimated Creatinine Clearance: 28.3 mL/min (A) (by C-G formula based on SCr of 1.41 mg/dL (H)). Liver Function Tests: Recent Labs  Lab 01/13/21 2113 01/15/21 0631  AST 26 20  ALT 6 9  ALKPHOS 77 64  BILITOT 0.7 0.7  PROT 7.2 6.3*  ALBUMIN 3.1* 2.5*   No results for input(s): LIPASE, AMYLASE in the last 168 hours. No results for input(s): AMMONIA in the last 168 hours. Coagulation Profile: No results for input(s): INR, PROTIME in the last 168 hours. Cardiac Enzymes: No results  for input(s): CKTOTAL, CKMB, CKMBINDEX, TROPONINI in the last 168 hours. BNP (last 3 results) No results for input(s): PROBNP in the last 8760 hours. HbA1C: No results for input(s): HGBA1C in the last 72 hours. CBG: No results for input(s): GLUCAP in the last 168 hours. Lipid Profile: No results for input(s): CHOL, HDL, LDLCALC, TRIG, CHOLHDL, LDLDIRECT in the last 72 hours. Thyroid Function Tests: Recent Labs    01/13/21 2116 01/15/21 0631  TSH 9.763*  --   FREET4  --  0.90   Anemia Panel: Recent Labs    01/14/21 0512  FERRITIN 156   Urine analysis:    Component Value Date/Time   COLORURINE YELLOW 09/26/2020 1331   APPEARANCEUR CLEAR 09/26/2020 1331   LABSPEC 1.020 09/26/2020 1331   PHURINE 6.5 09/26/2020 1331   GLUCOSEU NEGATIVE 09/26/2020 1331  HGBUR NEGATIVE 09/26/2020 1331   BILIRUBINUR NEGATIVE 09/26/2020 1331   KETONESUR NEGATIVE 09/26/2020 1331   PROTEINUR NEGATIVE 09/26/2020 1331   NITRITE NEGATIVE 09/26/2020 1331   LEUKOCYTESUR SMALL (A) 09/26/2020 1331   Recent Results (from the past 240 hour(s))  Resp Panel by RT-PCR (Flu A&B, Covid) Nasopharyngeal Swab     Status: Abnormal   Collection Time: 01/14/21 12:13 AM   Specimen: Nasopharyngeal Swab; Nasopharyngeal(NP) swabs in vial transport medium  Result Value Ref Range Status   SARS Coronavirus 2 by RT PCR POSITIVE (A) NEGATIVE Final    Comment: RESULT CALLED TO, READ BACK BY AND VERIFIED WITH: AMBER BIESECKER RN 0127 01/14/21 HNM (NOTE) SARS-CoV-2 target nucleic acids are DETECTED.  The SARS-CoV-2 RNA is generally detectable in upper respiratory specimens during the acute phase of infection. Positive results are indicative of the presence of the identified virus, but do not rule out bacterial infection or co-infection with other pathogens not detected by the test. Clinical correlation with patient history and other diagnostic information is necessary to determine patient infection status. The expected  result is Negative.  Fact Sheet for Patients: EntrepreneurPulse.com.au  Fact Sheet for Healthcare Providers: IncredibleEmployment.be  This test is not yet approved or cleared by the [place] FDA and  has been authorized for detection and/or diagnosis of SARS-CoV-2 by FDA under an Emergency Use Authorization (EUA).  This EUA will remain in effect (meaning this test can  be used) for the duration of  the COVID-19 declaration under Section 564(b)(1) of the Act, 21 U.S.C. section 360bbb-3(b)(1), unless the authorization is terminated or revoked sooner.     Influenza A by PCR NEGATIVE NEGATIVE Final   Influenza B by PCR NEGATIVE NEGATIVE Final    Comment: (NOTE) The Xpert Xpress SARS-CoV-2/FLU/RSV plus assay is intended as an aid in the diagnosis of influenza from Nasopharyngeal swab specimens and should not be used as a sole basis for treatment. Nasal washings and aspirates are unacceptable for Xpert Xpress SARS-CoV-2/FLU/RSV testing.  Fact Sheet for Patients: EntrepreneurPulse.com.au  Fact Sheet for Healthcare Providers: IncredibleEmployment.be  This test is not yet approved or cleared by the [place] FDA and has been authorized for detection and/or diagnosis of SARS-CoV-2 by FDA under an Emergency Use Authorization (EUA). This EUA will remain in effect (meaning this test can be used) for the duration of the COVID-19 declaration under Section 564(b)(1) of the Act, 21 U.S.C. section 360bbb-3(b)(1), unless the authorization is terminated or revoked.  Performed at [facility], [address], [place], [place] [address]       Radiology Studies: CT Head Wo Contrast  Result Date: 01/13/2021 CLINICAL DATA:  [place] with bruises to the forehead.  Neck trauma. EXAM: CT HEAD WITHOUT CONTRAST CT CERVICAL SPINE WITHOUT CONTRAST TECHNIQUE: Multidetector CT imaging of the head and cervical spine was  performed following the standard protocol without intravenous contrast. Multiplanar CT image reconstructions of the cervical spine were also generated. COMPARISON:  CT head and cervical spine 07/31/2018 FINDINGS: CT HEAD FINDINGS Brain: No evidence of acute infarction, hemorrhage, hydrocephalus, extra-axial collection or mass lesion/mass effect. Diffuse cerebral atrophy. Ventricular dilatation likely due to central atrophy. Patchy low-attenuation changes in the deep white matter consistent small vessel ischemia. Vascular: Moderate intracranial arterial vascular calcifications. Skull: Calvarium appears intact. Sinuses/Orbits: Paranasal sinuses and mastoid air cells are clear. Other: None. CT CERVICAL SPINE FINDINGS Alignment: Normal alignment Skull base and vertebrae: No acute fracture. No primary bone lesion or focal pathologic process. Soft tissues and spinal  canal: No prevertebral fluid or swelling. No visible canal hematoma. Disc levels: Degenerative changes with narrowed interspaces and endplate hypertrophic change most prominent at C5-6 and C6-7 levels. Prominent ligamentous calcifications. Degenerative changes in the facet joints. Upper chest: Bilateral pleural effusions are demonstrated in the apices mid Other: Spur calcifications IMPRESSION: 1. No acute intracranial abnormalities. Chronic atrophy and small vessel ischemic changes. 2. Normal alignment of the cervical spine. Degenerative changes. No acute displaced fractures identified. 3. Bilateral pleural effusions. Electronically Signed   By: Lucienne Capers M.D.   On: 01/13/2021 22:06   CT Cervical Spine Wo Contrast  Result Date: 01/13/2021 CLINICAL DATA:  [place] with bruises to the forehead.  Neck trauma. EXAM: CT HEAD WITHOUT CONTRAST CT CERVICAL SPINE WITHOUT CONTRAST TECHNIQUE: Multidetector CT imaging of the head and cervical spine was performed following the standard protocol without intravenous contrast. Multiplanar CT image reconstructions of  the cervical spine were also generated. COMPARISON:  CT head and cervical spine 07/31/2018 FINDINGS: CT HEAD FINDINGS Brain: No evidence of acute infarction, hemorrhage, hydrocephalus, extra-axial collection or mass lesion/mass effect. Diffuse cerebral atrophy. Ventricular dilatation likely due to central atrophy. Patchy low-attenuation changes in the deep white matter consistent small vessel ischemia. Vascular: Moderate intracranial arterial vascular calcifications. Skull: Calvarium appears intact. Sinuses/Orbits: Paranasal sinuses and mastoid air cells are clear. Other: None. CT CERVICAL SPINE FINDINGS Alignment: Normal alignment Skull base and vertebrae: No acute fracture. No primary bone lesion or focal pathologic process. Soft tissues and spinal canal: No prevertebral fluid or swelling. No visible canal hematoma. Disc levels: Degenerative changes with narrowed interspaces and endplate hypertrophic change most prominent at C5-6 and C6-7 levels. Prominent ligamentous calcifications. Degenerative changes in the facet joints. Upper chest: Bilateral pleural effusions are demonstrated in the apices mid Other: Spur calcifications IMPRESSION: 1. No acute intracranial abnormalities. Chronic atrophy and small vessel ischemic changes. 2. Normal alignment of the cervical spine. Degenerative changes. No acute displaced fractures identified. 3. Bilateral pleural effusions. Electronically Signed   By: Lucienne Capers M.D.   On: 01/13/2021 22:06   DG Chest Port 1 View  Result Date: 01/15/2021 CLINICAL DATA:  Hypoxic respiratory failure EXAM: PORTABLE CHEST 1 VIEW COMPARISON:  01/13/2021 FINDINGS: Large, partially loculated right pleural effusion is again identified, stable since prior examination with associated right basilar compressive atelectasis. Lung volumes are small. Superimposed perihilar interstitial edema, best appreciated within the left lung, appears slightly improved. No pneumothorax. No pleural effusion on  the left. Left subclavian dual lead pacemaker is unchanged. Cardiac size within normal limits. No acute bone abnormality. IMPRESSION: Stable large right partially loculated pleural effusion. Improving superimposed mild interstitial pulmonary edema Electronically Signed   By: Fidela Salisbury MD   On: 01/15/2021 01:46   DG Chest Port 1 View  Result Date: 01/13/2021 CLINICAL DATA:  Cough and shortness of breath.  Weakness and fall. EXAM: PORTABLE CHEST 1 VIEW COMPARISON:  12/19/2020 FINDINGS: Cardiac pacemaker. Shallow inspiration. Heart size is obscured but appears enlarged. Bilateral perihilar infiltration/edema. Moderate right pleural effusion. Progression of changes since previous study. Calcification of the aorta. IMPRESSION: Progression of bilateral perihilar infiltration/edema and right pleural effusion. Electronically Signed   By: Lucienne Capers M.D.   On: 01/13/2021 21:28    Scheduled Meds: . ascorbic acid  500 mg Oral Daily  . atorvastatin  80 mg Oral QPM  . bacitracin   Topical BID  . carbidopa-levodopa  1 tablet Oral TID  . dexamethasone (DECADRON) injection  4 mg Intravenous Q12H  . divalproex  250 mg Oral TID  . donepezil  10 mg Oral QHS  . enoxaparin (LOVENOX) injection  30 mg Subcutaneous Q24H  . famotidine  20 mg Oral BID  . furosemide  60 mg Intravenous BID  . memantine  10 mg Oral BID  . metoprolol succinate  12.5 mg Oral Daily  . mirtazapine  15 mg Oral QHS   Continuous Infusions: . remdesivir 100 mg in NS 100 mL 100 mg (01/15/21 1245)     LOS: 2 days   Time spent: 35 minutes.  Patrecia Pour, MD [facility] [email or web address] 01/15/2021, 4:56 PM

## 2021-01-15 NOTE — Progress Notes (Signed)
Physical Therapy Treatment Patient Details Name: Eric Hendricks MRN: [id number] DOB: December 24, 1923 Today's Date: 01/15/2021    History of Present Illness Pt is a 87 y/o M with PMH: parkinson's disease, dementia, CAD, sCHF, and depression.  Pt just recently admitted for recurrent pleural effusion. Presents this time from [facility] s/p fall. Found to have COVID-19.    PT Comments    Patient received in bed resting. [place] to my touch. He is very HOH and difficult to instruct due to this. Patient is mod independent with bed mobility slightly impulsive and needs gestures to no get up without getting lines ready. Patient performed sit to stand with min assist. Upon standing discovered he had BM in bed. Patient stood while I assisted in cleaning him up and changed bed pad.  He was able to take a couple of steps along side of bed, then patient returned to supine and began having more BM. NT called and advised. Patient's HR increases and O2 sats decrease with minimal mobility.  Patient will continue to benefit from skilled PT while here to improve strength and functional independence.       Follow Up Recommendations  SNF     Equipment Recommendations  None recommended by PT    Recommendations for Other Services       Precautions / Restrictions Precautions Precautions: Fall Restrictions Weight Bearing Restrictions: No Other Position/Activity Restrictions: monitor BP and O2    Mobility  Bed Mobility Overal bed mobility: Modified Independent                  Transfers Overall transfer level: Needs assistance Equipment used: Rolling walker (2 wheeled) Transfers: Sit to/from Stand Sit to Stand: Min assist            Ambulation/Gait Ambulation/Gait assistance: Min assist Gait Distance (Feet): 2 Feet Assistive device: Rolling walker (2 wheeled) Gait Pattern/deviations: Step-to pattern;Decreased step length - right;Decreased step length - left     General Gait Details:  limited ambulation due to patient having BM while standing, returned to bed.   Stairs             Wheelchair Mobility    Modified Rankin (Stroke Patients Only)       Balance Overall balance assessment: Needs assistance Sitting-balance support: Feet supported Sitting balance-Leahy Scale: Good     Standing balance support: Bilateral upper extremity supported;During functional activity Standing balance-Leahy Scale: Fair Standing balance comment: Requires UE support for balance with standing                            Cognition Arousal/Alertness: Awake/alert Behavior During Therapy: WFL for tasks assessed/performed Overall Cognitive Status: Difficult to assess                                 General Comments: able to follow all simple one step commands, requires gestures, safety is an issue due to his deafness.      Exercises      General Comments        Pertinent Vitals/Pain Pain Assessment: No/denies pain    Home Living                      Prior Function            PT Goals (current goals can now be found in the care plan section) Acute Rehab PT  Goals Patient Stated Goal: to get stronger PT Goal Formulation: With patient Time For Goal Achievement: 01/28/21 Potential to Achieve Goals: Fair Progress towards PT goals: Not progressing toward goals - comment (patient limited by BM during session and required to get back into bed for cleaning)    Frequency    Min 2X/week      PT Plan Current plan remains appropriate    Co-evaluation              AM-PAC PT "6 Clicks" Mobility   Outcome Measure  Help needed turning from your back to your side while in a flat bed without using bedrails?: None Help needed moving from lying on your back to sitting on the side of a flat bed without using bedrails?: A Little Help needed moving to and from a bed to a chair (including a wheelchair)?: A Little Help needed standing  up from a chair using your arms (e.g., wheelchair or bedside chair)?: A Little Help needed to walk in hospital room?: A Lot Help needed climbing 3-5 steps with a railing? : A Lot 6 Click Score: 17    End of Session Equipment Utilized During Treatment: Oxygen Activity Tolerance: Patient limited by fatigue;Other (comment) (limited by BM in bed and while standing) Patient left: in bed;with call bell/phone within reach;with bed alarm set Nurse Communication: Mobility status;Other (comment) (patient will require additional cleaning up and linen change) PT Visit Diagnosis: Unsteadiness on feet (R26.81);History of falling (Z91.81);Difficulty in walking, not elsewhere classified (R26.2);Muscle weakness (generalized) (M62.81)     Time: 7672-0947 PT Time Calculation (min) (ACUTE ONLY): 14 min  Charges:  $Therapeutic Activity: 8-22 mins                     Kendallyn Lippold, PT, GCS 01/15/21,3:39 PM

## 2021-01-16 DIAGNOSIS — J9621 Acute and chronic respiratory failure with hypoxia: Secondary | ICD-10-CM

## 2021-01-16 DIAGNOSIS — I509 Heart failure, unspecified: Secondary | ICD-10-CM | POA: Diagnosis not present

## 2021-01-16 DIAGNOSIS — Z515 Encounter for palliative care: Secondary | ICD-10-CM | POA: Diagnosis not present

## 2021-01-16 DIAGNOSIS — F0390 Unspecified dementia without behavioral disturbance: Secondary | ICD-10-CM

## 2021-01-16 DIAGNOSIS — U071 COVID-19: Secondary | ICD-10-CM | POA: Diagnosis not present

## 2021-01-16 DIAGNOSIS — J1282 Pneumonia due to coronavirus disease 2019: Secondary | ICD-10-CM

## 2021-01-16 DIAGNOSIS — Z66 Do not resuscitate: Secondary | ICD-10-CM | POA: Diagnosis not present

## 2021-01-16 DIAGNOSIS — I5021 Acute systolic (congestive) heart failure: Secondary | ICD-10-CM | POA: Diagnosis not present

## 2021-01-16 DIAGNOSIS — Z7189 Other specified counseling: Secondary | ICD-10-CM | POA: Diagnosis not present

## 2021-01-16 LAB — COMPREHENSIVE METABOLIC PANEL
ALT: 12 U/L (ref 0–44)
AST: 14 U/L — ABNORMAL LOW (ref 15–41)
Albumin: 2.6 g/dL — ABNORMAL LOW (ref 3.5–5.0)
Alkaline Phosphatase: 54 U/L (ref 38–126)
Anion gap: 10 (ref 5–15)
BUN: 36 mg/dL — ABNORMAL HIGH (ref 8–23)
CO2: 30 mmol/L (ref 22–32)
Calcium: 7.9 mg/dL — ABNORMAL LOW (ref 8.9–10.3)
Chloride: 101 mmol/L (ref 98–111)
Creatinine, Ser: 1.16 mg/dL (ref 0.61–1.24)
GFR, Estimated: 58 mL/min — ABNORMAL LOW (ref >60)
Glucose, Bld: 129 mg/dL — ABNORMAL HIGH (ref 70–99)
Potassium: 3.8 mmol/L (ref 3.5–5.1)
Sodium: 141 mmol/L (ref 135–145)
Total Bilirubin: 0.7 mg/dL (ref 0.3–1.2)
Total Protein: 6.4 g/dL — ABNORMAL LOW (ref 6.5–8.1)

## 2021-01-16 LAB — CBC WITH DIFFERENTIAL/PLATELET
Abs Immature Granulocytes: 0.03 10*3/uL (ref 0.00–0.07)
Basophils Absolute: 0 10*3/uL (ref 0.0–0.1)
Basophils Relative: 0 %
Eosinophils Absolute: 0 10*3/uL (ref 0.0–0.5)
Eosinophils Relative: 0 %
HCT: 34.3 % — ABNORMAL LOW (ref 39.0–52.0)
Hemoglobin: 10.5 g/dL — ABNORMAL LOW (ref 13.0–17.0)
Immature Granulocytes: 1 %
Lymphocytes Relative: 2 %
Lymphs Abs: 0.1 10*3/uL — ABNORMAL LOW (ref 0.7–4.0)
MCH: 29.7 pg (ref 26.0–34.0)
MCHC: 30.6 g/dL (ref 30.0–36.0)
MCV: 96.9 fL (ref 80.0–100.0)
Monocytes Absolute: 0.2 10*3/uL (ref 0.1–1.0)
Monocytes Relative: 4 %
Neutro Abs: 5.5 10*3/uL (ref 1.7–7.7)
Neutrophils Relative %: 93 %
Platelets: 218 10*3/uL (ref 150–400)
RBC: 3.54 MIL/uL — ABNORMAL LOW (ref 4.22–5.81)
RDW: 17.9 % — ABNORMAL HIGH (ref 11.5–15.5)
WBC: 5.9 10*3/uL (ref 4.0–10.5)
nRBC: 0 % (ref 0.0–0.2)

## 2021-01-16 LAB — D-DIMER, QUANTITATIVE: D-Dimer, Quant: 5.82 ug/mL-FEU — ABNORMAL HIGH (ref 0.00–0.50)

## 2021-01-16 LAB — C-REACTIVE PROTEIN: CRP: 3.7 mg/dL — ABNORMAL HIGH (ref <1.0)

## 2021-01-16 LAB — T3, FREE: T3, Free: 1.8 pg/mL — ABNORMAL LOW (ref 2.0–4.4)

## 2021-01-16 NOTE — Progress Notes (Signed)
Daily Progress Note   Patient Name: Eric Hendricks       Date: 01/16/2021 DOB: 04-23-1924  Age: 87 y.o. MRN#: [id number] Attending Physician: Patrecia Pour, MD Primary Care Physician: [name], MD Admit Date: 01/13/2021  Reason for Consultation/Follow-up: Establishing goals of care  Subjective: "I feel lousy, I'm so tired"  Length of Stay: 3  Current Medications: Scheduled Meds:  . ascorbic acid  500 mg Oral Daily  . atorvastatin  80 mg Oral QPM  . bacitracin   Topical BID  . carbidopa-levodopa  1 tablet Oral TID  . dexamethasone (DECADRON) injection  4 mg Intravenous Q12H  . divalproex  250 mg Oral TID  . donepezil  10 mg Oral QHS  . enoxaparin (LOVENOX) injection  30 mg Subcutaneous Q24H  . famotidine  20 mg Oral BID  . furosemide  60 mg Intravenous BID  . memantine  10 mg Oral BID  . metoprolol succinate  12.5 mg Oral Daily  . mirtazapine  15 mg Oral QHS    Continuous Infusions: . remdesivir 100 mg in NS 100 mL Stopped (01/16/21 0953)    PRN Meds: acetaminophen **OR** acetaminophen, docusate sodium, magnesium hydroxide, ondansetron **OR** ondansetron (ZOFRAN) IV, polyvinyl alcohol, traZODone  Physical Exam Constitutional:      General: He is not in acute distress. Pulmonary:     Effort: Pulmonary effort is normal.     Comments: 5 L [place] Skin:    General: Skin is warm and dry.  Neurological:     Mental Status: He is alert.     Comments: HOH, answers questions appropriately             Vital Signs: BP 111/61 (BP Location: Right Arm)   Pulse 72   Temp (!) 97.1 F (36.2 C) (Oral)   Resp (!) 22   Wt 62.4 kg   SpO2 92%   BMI 20.92 kg/m  SpO2: SpO2: 92 % O2 Device: O2 Device: High Flow Nasal Cannula O2 Flow Rate: O2 Flow Rate (L/min): 5 L/min  Intake/output  summary:   Intake/Output Summary (Last 24 hours) at 01/16/2021 1353 Last data filed at 01/16/2021 1041 Gross per 24 hour  Intake 540 ml  Output 600 ml  Net -60 ml   LBM: Last BM Date: 01/15/21 Baseline Weight: Weight: 65.1 kg Most recent weight: Weight: 62.4 kg       Palliative Assessment/Data: PPS 40%    Flowsheet Rows   Flowsheet Row Most Recent Value  Intake Tab   Referral Department Pulmonary  Unit at Time of Referral Intermediate Care Unit  Palliative Care Primary Diagnosis Pulmonary  Date Notified 01/13/21  Palliative Care Type Return patient Palliative Care  Reason for referral Clarify Goals of Care  Date of Admission 01/13/21  Date first seen by Palliative Care 01/15/21  # of days Palliative referral response time 2 Day(s)  # of days IP prior to Palliative referral 0  Clinical Assessment   Palliative Performance Scale Score 50%  Psychosocial & Spiritual Assessment   Palliative Care Outcomes   Patient/Family meeting held? Yes  Who was at the meeting? son  Palliative Care Outcomes Clarified goals of care      Patient  Active Problem List   Diagnosis Date Noted  . Acute CHF (congestive heart failure) ([place]) 01/13/2021  . S/P thoracentesis   . Pleural effusion due to CHF (congestive heart failure) ([place]) 12/15/2020  . Acute respiratory failure with hypoxia ([place]) 05/10/2020  . Acute on chronic systolic CHF (congestive heart failure) ([place]) 05/10/2020  . Pleural effusion, right 05/09/2020  . Acute on chronic systolic heart failure ([place]) 11/21/2019  . AF (paroxysmal atrial fibrillation) ([place]) 11/21/2019  . Depression 11/21/2019  . Dementia due to Parkinson's disease without behavioral disturbance ([place]) 11/21/2019    Palliative Care Assessment & Plan   HPI: 87 y.o. male  with past medical history of systolic CHF, atrial fibrillation, Parkinson's disease, dyslipidemia, dementia and depression admitted on 01/13/2021 with fall and forehead abrasion. Patient diagnosed  with acute CHF.  Found to have recurrent R pleural effusion - IR reports loculations on ultrasound during last attempt, considering chest tube. Also being treated for Covid-19 pna. PMT consulted to discuss [place].   Assessment: Oxygen increased a bit to 5L. Nurse reports no significant events. Ate good amount of breakfast. Patient reports not feeling well, weak.  Spoke with son, Eric Hendricks. He reviewed his conversations with pulmonology and and IR with me. Tells me he does not know what is best for his dad. I ask him to try to imagine what his dad would choose if he could fully understand his options - he tells me thinks he dad would choose a conservative approach/focus on comfort/avoid chest tube. He tells me he has spoken with his sister - she understands options as well. We discuss what it would look like if we avoided aggressive care and focused more on his comfort. He expressed understanding. He requests time to pray about decisions.  Recommendations/Plan:  Son unsure about moving forward with chest tube - requests time to consider options  Discussed transition to comfort focused care - patient was receiving hospice services outpatient - son unsure, requests time  PMT will follow up Monday  Code Status:  DNR   Discharge Planning:  To Be Determined  Care plan was discussed with son, Dr. Lanney Gins  Thank you for allowing the Palliative Medicine Team to assist in the care of this patient.   Total Time 30 minutes Prolonged Time Billed  no       Greater than 50%  of this time was spent counseling and coordinating care related to the above assessment and plan.  Juel Burrow, DNP, [facility] Palliative Medicine Team Team Phone # [phone]  Pager [phone]

## 2021-01-16 NOTE — Progress Notes (Signed)
Pulmonary Medicine          Date: 01/16/2021,   MRN# [id number] Eric Hendricks 1924-02-16     AdmissionWeight: 65.1 kg                 CurrentWeight: 62.4 kg   Referring physician: Dr Bonner Puna   CHIEF COMPLAINT:   Acute on chronic hypoxemic respiratory failure with recurrent pleural effusion   HISTORY OF PRESENT ILLNESS   As per admission h/p this is a15 y.o. Caucasian male with medical history significant for systolic CHF, atrial fibrillation, Parkinson's disease, dyslipidemia, dementia and depression, who presented to emergency room after having an accidental fall with subsequent left forehead abrasion.  The patient apparently rolled from his bed while asleep and immediately woke up.  He denied any presyncope or syncope.  No chest pain or palpitations.  He had a small abrasion to the left shoulder as well.  No paresthesias or focal muscle weakness.  No dysuria, oliguria or hematuria or flank pain reported.  Is a fairly poor historian due to his dementia.  Per the patient's son he has apparently been followed by hospice at his SNF.   When he came to the ER heart rate was 115 with possibility of her percent on 4 L of O2 nasal cannula as his pulse oximetry was 88% on his baseline 3 L.  Vital signs were otherwise normal.  CMP was remarkable for a BUN of 44 and creatinine 1.57 compared to 36 and 1.42 on 12/18/2020.  BNP was 582.  CBC showed mild anemia close to baseline.  Respiratory panel is currently pending.  Head and C-spine CT showed no acute intracranial abnormalities and chronic small vessel ischemic disease with chronic atrophy and normal alignment of the C-spine with degenerative changes as well as bilateral pleural effusions CTA revealed large right pleural effusion with subtotal collapse of the right lung and small left pleural effusion, moderate coronary artery calcification, aortic atherosclerosis with no PE.  PCCM consultation placed for loculated right pleural effusion.  I  discussed case with Attending physician over phone after my evaluation of imaging and labs.  Patient is calm non tachypneic.  He diuresed well, he is COVID+ and had negative test last month so this seems to be new case.  He is on 3L [place] and with sPO2>95%  01/16/21- patient resting in bed in no distress. His BP is mildy low today.  I discussed with IR regarding thora/chest tube it seems that multiloculated effusion is precluding thoracentesis so we agreed on chest tube placement and IR team is in communication with POA son [name].   PAST MEDICAL HISTORY   Past Medical History:  Diagnosis Date  . Arrhythmia    atrial fibrillation  . B12 deficiency   . CHF (congestive heart failure) ([place])   . Dementia ([place])   . Depression   . Hyperlipemia   . Parkinson's disease ([place])   . Prostate cancer [facility])      SURGICAL HISTORY   Past Surgical History:  Procedure Laterality Date  . ANKLE SURGERY Right   . APPENDECTOMY    . CATARACT EXTRACTION Bilateral   . TRANSURETHRAL RESECTION OF PROSTATE       FAMILY HISTORY   Family History  Problem Relation Age of Onset  . Colon cancer Father   . Tremor Mother   . Parkinson's disease Brother   . Prostate cancer Brother   . Lung cancer Sister   . Emphysema Sister   . Heart  failure Child   . Diabetes Child   . AAA (abdominal aortic aneurysm) Child      SOCIAL HISTORY   Social History   Tobacco Use  . Smoking status: Never Smoker  . Smokeless tobacco: Never Used  Vaping Use  . Vaping Use: Never used  Substance Use Topics  . Alcohol use: No    Alcohol/week: 0.0 standard drinks  . Drug use: No     MEDICATIONS    Home Medication:    Current Medication:  Current Facility-Administered Medications:  .  acetaminophen (TYLENOL) tablet 650 mg, 650 mg, Oral, Q6H PRN **OR** acetaminophen (TYLENOL) suppository 650 mg, 650 mg, Rectal, Q6H PRN, Mansy, Jan A, MD .  ascorbic acid (VITAMIN C) tablet 500 mg, 500 mg, Oral, Daily, Mansy, Jan A,  MD, 500 mg at 01/16/21 1016 .  atorvastatin (LIPITOR) tablet 80 mg, 80 mg, Oral, QPM, Mansy, Jan A, MD, 80 mg at 01/15/21 1702 .  bacitracin ointment, , Topical, BID, Patrecia Pour, MD, Given at 01/16/21 1019 .  carbidopa-levodopa (SINEMET IR) 25-100 MG per tablet immediate release 1 tablet, 1 tablet, Oral, TID, Mansy, Arvella Merles, MD, 1 tablet at 01/16/21 0903 .  dexamethasone (DECADRON) injection 4 mg, 4 mg, Intravenous, Q12H, Lanney Gins, Weronika Birch, MD, 4 mg at 01/16/21 1019 .  divalproex (DEPAKOTE) DR tablet 250 mg, 250 mg, Oral, TID, Mansy, Jan A, MD, 250 mg at 01/16/21 1018 .  docusate sodium (COLACE) capsule 100 mg, 100 mg, Oral, BID PRN, Mansy, Jan A, MD .  donepezil (ARICEPT) tablet 10 mg, 10 mg, Oral, QHS, Mansy, Jan A, MD, 10 mg at 01/15/21 2115 .  enoxaparin (LOVENOX) injection 30 mg, 30 mg, Subcutaneous, Q24H, Mansy, Jan A, MD, 30 mg at 01/16/21 1017 .  famotidine (PEPCID) tablet 20 mg, 20 mg, Oral, BID, Mansy, Jan A, MD, 20 mg at 01/16/21 1017 .  furosemide (LASIX) injection 60 mg, 60 mg, Intravenous, BID, Mansy, Jan A, MD, 60 mg at 01/16/21 0906 .  magnesium hydroxide (MILK OF MAGNESIA) suspension 30 mL, 30 mL, Oral, Daily PRN, Mansy, Jan A, MD .  memantine [facility]) tablet 10 mg, 10 mg, Oral, BID, Mansy, Jan A, MD, 10 mg at 01/16/21 1018 .  metoprolol succinate (TOPROL-XL) 24 hr tablet 12.5 mg, 12.5 mg, Oral, Daily, Mansy, Jan A, MD, 12.5 mg at 01/16/21 1016 .  mirtazapine (REMERON) tablet 15 mg, 15 mg, Oral, QHS, Mansy, Jan A, MD, 15 mg at 01/15/21 2116 .  ondansetron (ZOFRAN) tablet 4 mg, 4 mg, Oral, Q6H PRN **OR** ondansetron (ZOFRAN) injection 4 mg, 4 mg, Intravenous, Q6H PRN, Mansy, Jan A, MD .  polyvinyl alcohol (LIQUIFILM TEARS) 1.4 % ophthalmic solution 2 drop, 2 drop, Both Eyes, Q4H PRN, Mansy, Jan A, MD .  [COMPLETED] remdesivir 200 mg in sodium chloride 0.9% 250 mL IVPB, 200 mg, Intravenous, Once, Stopped at 01/14/21 2110 **FOLLOWED BY** remdesivir 100 mg in sodium chloride 0.9 % 100  mL IVPB, 100 mg, Intravenous, Daily, Rauer, Forde Dandy, RPH, Stopped at 01/16/21 0953 .  traZODone (DESYREL) tablet 25 mg, 25 mg, Oral, QHS PRN, Mansy, Jan A, MD, 25 mg at 01/15/21 2116    ALLERGIES   Phenylephrine-guaifenesin, Aspirin, Guaifenesin, Guanfacine, Phenylephrine, and Suprax [cefixime]     REVIEW OF SYSTEMS    Review of Systems:  Gen:  Denies  fever, sweats, chills weigh loss  HEENT: Denies blurred vision, double vision, ear pain, eye pain, hearing loss, nose bleeds, sore throat Cardiac:  No dizziness, chest pain or  heaviness, chest tightness,edema Resp:   Denies cough or sputum porduction, shortness of breath,wheezing, hemoptysis,  Gi: Denies swallowing difficulty, stomach pain, nausea or vomiting, diarrhea, constipation, bowel incontinence Gu:  Denies bladder incontinence, burning urine Ext:   Denies Joint pain, stiffness or swelling Skin: Denies  skin rash, easy bruising or bleeding or hives Endoc:  Denies polyuria, polydipsia , polyphagia or weight change Psych:   Denies depression, insomnia or hallucinations   Other:  All other systems negative   VS: BP 117/80 (BP Location: Right Arm)   Pulse 94   Temp 97.9 F (36.6 C) (Oral)   Resp (!) 21   Wt 62.4 kg   SpO2 92%   BMI 20.92 kg/m      PHYSICAL EXAM    GENERAL:NAD, no fevers, chills, no weakness no fatigue HEAD: Normocephalic, atraumatic.  EYES: Pupils equal, round, reactive to light. Extraocular muscles intact. No scleral icterus.  MOUTH: Moist mucosal membrane. Dentition intact. No abscess noted.  EAR, NOSE, THROAT: Clear without exudates. No external lesions.  NECK: Supple. No thyromegaly. No nodules. No JVD.  PULMONARY: decreased air entry worse on right CARDIOVASCULAR: S1 and S2. Regular rate and rhythm. No murmurs, rubs, or gallops. No edema. Pedal pulses 2+ bilaterally.  GASTROINTESTINAL: Soft, nontender, nondistended. No masses. Positive bowel sounds. No hepatosplenomegaly.   MUSCULOSKELETAL: No swelling, clubbing, or edema. Range of motion full in all extremities.  NEUROLOGIC: Cranial nerves II through XII are intact. No gross focal neurological deficits. Sensation intact. Reflexes intact.  SKIN: No ulceration, lesions, rashes, or cyanosis. Skin warm and dry. Turgor intact.  PSYCHIATRIC: Mood, affect within normal limits. The patient is awake, alert and oriented x 3. Insight, judgment intact.       IMAGING    CT Head Wo Contrast  Result Date: 01/13/2021 CLINICAL DATA:  [place] with bruises to the forehead.  Neck trauma. EXAM: CT HEAD WITHOUT CONTRAST CT CERVICAL SPINE WITHOUT CONTRAST TECHNIQUE: Multidetector CT imaging of the head and cervical spine was performed following the standard protocol without intravenous contrast. Multiplanar CT image reconstructions of the cervical spine were also generated. COMPARISON:  CT head and cervical spine 07/31/2018 FINDINGS: CT HEAD FINDINGS Brain: No evidence of acute infarction, hemorrhage, hydrocephalus, extra-axial collection or mass lesion/mass effect. Diffuse cerebral atrophy. Ventricular dilatation likely due to central atrophy. Patchy low-attenuation changes in the deep white matter consistent small vessel ischemia. Vascular: Moderate intracranial arterial vascular calcifications. Skull: Calvarium appears intact. Sinuses/Orbits: Paranasal sinuses and mastoid air cells are clear. Other: None. CT CERVICAL SPINE FINDINGS Alignment: Normal alignment Skull base and vertebrae: No acute fracture. No primary bone lesion or focal pathologic process. Soft tissues and spinal canal: No prevertebral fluid or swelling. No visible canal hematoma. Disc levels: Degenerative changes with narrowed interspaces and endplate hypertrophic change most prominent at C5-6 and C6-7 levels. Prominent ligamentous calcifications. Degenerative changes in the facet joints. Upper chest: Bilateral pleural effusions are demonstrated in the apices mid Other: Spur  calcifications IMPRESSION: 1. No acute intracranial abnormalities. Chronic atrophy and small vessel ischemic changes. 2. Normal alignment of the cervical spine. Degenerative changes. No acute displaced fractures identified. 3. Bilateral pleural effusions. Electronically Signed   By: Lucienne Capers M.D.   On: 01/13/2021 22:06   CT Cervical Spine Wo Contrast  Result Date: 01/13/2021 CLINICAL DATA:  [place] with bruises to the forehead.  Neck trauma. EXAM: CT HEAD WITHOUT CONTRAST CT CERVICAL SPINE WITHOUT CONTRAST TECHNIQUE: Multidetector CT imaging of the head and cervical spine was performed following the  standard protocol without intravenous contrast. Multiplanar CT image reconstructions of the cervical spine were also generated. COMPARISON:  CT head and cervical spine 07/31/2018 FINDINGS: CT HEAD FINDINGS Brain: No evidence of acute infarction, hemorrhage, hydrocephalus, extra-axial collection or mass lesion/mass effect. Diffuse cerebral atrophy. Ventricular dilatation likely due to central atrophy. Patchy low-attenuation changes in the deep white matter consistent small vessel ischemia. Vascular: Moderate intracranial arterial vascular calcifications. Skull: Calvarium appears intact. Sinuses/Orbits: Paranasal sinuses and mastoid air cells are clear. Other: None. CT CERVICAL SPINE FINDINGS Alignment: Normal alignment Skull base and vertebrae: No acute fracture. No primary bone lesion or focal pathologic process. Soft tissues and spinal canal: No prevertebral fluid or swelling. No visible canal hematoma. Disc levels: Degenerative changes with narrowed interspaces and endplate hypertrophic change most prominent at C5-6 and C6-7 levels. Prominent ligamentous calcifications. Degenerative changes in the facet joints. Upper chest: Bilateral pleural effusions are demonstrated in the apices mid Other: Spur calcifications IMPRESSION: 1. No acute intracranial abnormalities. Chronic atrophy and small vessel ischemic  changes. 2. Normal alignment of the cervical spine. Degenerative changes. No acute displaced fractures identified. 3. Bilateral pleural effusions. Electronically Signed   By: Lucienne Capers M.D.   On: 01/13/2021 22:06   DG Chest Port 1 View  Result Date: 01/15/2021 CLINICAL DATA:  Hypoxic respiratory failure EXAM: PORTABLE CHEST 1 VIEW COMPARISON:  01/13/2021 FINDINGS: Large, partially loculated right pleural effusion is again identified, stable since prior examination with associated right basilar compressive atelectasis. Lung volumes are small. Superimposed perihilar interstitial edema, best appreciated within the left lung, appears slightly improved. No pneumothorax. No pleural effusion on the left. Left subclavian dual lead pacemaker is unchanged. Cardiac size within normal limits. No acute bone abnormality. IMPRESSION: Stable large right partially loculated pleural effusion. Improving superimposed mild interstitial pulmonary edema Electronically Signed   By: Fidela Salisbury MD   On: 01/15/2021 01:46   DG Chest Port 1 View  Result Date: 01/13/2021 CLINICAL DATA:  Cough and shortness of breath.  Weakness and fall. EXAM: PORTABLE CHEST 1 VIEW COMPARISON:  12/19/2020 FINDINGS: Cardiac pacemaker. Shallow inspiration. Heart size is obscured but appears enlarged. Bilateral perihilar infiltration/edema. Moderate right pleural effusion. Progression of changes since previous study. Calcification of the aorta. IMPRESSION: Progression of bilateral perihilar infiltration/edema and right pleural effusion. Electronically Signed   By: Lucienne Capers M.D.   On: 01/13/2021 21:28   DG Chest Port 1 View  Result Date: 12/19/2020 CLINICAL DATA:  Recurrent right pleural effusion and status post thoracentesis. EXAM: PORTABLE CHEST 1 VIEW COMPARISON:  From earlier today at 1126 hours FINDINGS: Stable heart size and appearance of dual-chamber pacemaker. Decrease in pleural fluid since the prior chest x-ray with some  residual partially loculated fluid remaining. No pneumothorax. Improved aeration of the right lung. IMPRESSION: Decrease in right pleural fluid after thoracentesis without pneumothorax. Improved aeration of the right lung. Electronically Signed   By: Aletta Edouard M.D.   On: 12/19/2020 16:29   DG Chest Port 1 View  Result Date: 12/19/2020 CLINICAL DATA:  Dyspnea EXAM: PORTABLE CHEST 1 VIEW COMPARISON:  12/16/2020 FINDINGS: Dual lead pacer. Patient rotated right. Midline trachea. Mild cardiomegaly. Atherosclerosis in the transverse aorta. Moderate to large right pleural effusion with suggestion of loculation laterally, increased. No left pleural effusion. No pneumothorax. Mild pulmonary venous congestion. Increased right and similar left base airspace disease. IMPRESSION: Increase in moderate to large right pleural effusion with suggestion of loculation laterally. Adjacent right base airspace disease which could represent atelectasis or infection. Similar  left base Airspace disease, likely atelectasis. Aortic Atherosclerosis (ICD10-I70.0). Electronically Signed   By: Abigail Miyamoto M.D.   On: 12/19/2020 11:48   US THORACENTESIS ASP PLEURAL SPACE W/IMG GUIDE  Result Date: 12/19/2020 INDICATION: Patient with a history of CHF and recurrent pleural effusions. Interventional radiology asked to perform a therapeutic thoracentesis. EXAM: ULTRASOUND GUIDED THORACENTESIS MEDICATIONS: 1% lidocaine 10 mL COMPLICATIONS: None immediate. PROCEDURE: An ultrasound guided thoracentesis was thoroughly discussed with the patient and questions answered. The benefits, risks, alternatives and complications were also discussed. The patient understands and wishes to proceed with the procedure. Written consent was obtained. Ultrasound was performed to localize and mark an adequate pocket of fluid in the right chest. The area was then prepped and draped in the normal sterile fashion. 1% Lidocaine was used for local anesthesia. Under  ultrasound guidance a 6 Fr Safe-T-Centesis catheter was introduced. Thoracentesis was performed. The catheter was removed and a dressing applied. FINDINGS: A total of approximately 1.1 L of bloody fluid was removed. IMPRESSION: Successful ultrasound guided right thoracentesis yielding 0.1 L of pleural fluid. Read by: Soyla Dryer, NP Electronically Signed   By: Aletta Edouard M.D.   On: 12/19/2020 16:29      ASSESSMENT/PLAN   Acute on chronic hypoxemic respiratory failure Acute COVID19 pneumonia -Remdesevir antiviral - pharmacy protocol 5 d -vitamin C -zinc -decadron 4 mg IV daily  -Diuresis - Lasix 60 IV - monitor UOP - utilize external urinary catheter if possible -No need to  Self prone -encourage to use IS and Acapella device for bronchopulmonary hygiene when able -d/c hepatotoxic medications while on remdesevir -supportive care with ICU telemetry monitoring -PT/OT when possible -procalcitonin, CRP, ddimer and ferritin trending   Acute on chronic HFrEF 30-35%  - cardiology on case appreciate input  - continuing diuresis - 1200cc uop overnight -he is malnourished and has low albumin , will add once daily IV albumin while on lasix to potentiate diuresis  -continue statins -PT/OT - high fall risk - no anticoagulation    Right pleural effusion   - no signs of bacterial pneumonia and low suspicion for para-pneumonic effusion at this time due to absence of leukocytosis, fevers, bilateral infiltrates in context of viral pneumonia induced CHF exacerbation -agree with diuresis and medical management at this time -previous thoracentesis with 2L of blood -please order fluid studies on thoracentesis  -there is a loculated component to effusion which complicates things and may require chest tube after all.  I suspect this may be due to previous bloody aspirate on thoracentesis. -He has CKD which makes diuresis more complicated.  Overall due to age and DNR status I prefer conservative  approach. First we should do diagnostic thoracentesis with all fluid studies sent off for cytology and microbiology.  I will also ask palliative to consult due to possibly needing a chest tube which may be too aggressive for this centurian.      Thank you for allowing me to participate in the care of this patient.     Patient/Family are satisfied with care plan and all questions have been answered.  This document was prepared using Dragon voice recognition software and may include unintentional dictation errors.     Ottie Glazier, M.D.  Division of [facility]

## 2021-01-16 NOTE — Progress Notes (Signed)
87 y.o. male inpatient. History of COPD (on 4L O2) parkinson's disease, a fib, dementia, HLD. Presented to the ED at [facility] with injuries related to a fall from his bed. Found to in a fib with RVR, acute CHF with recurrent right side pleural effusions and COVID +. IR last performed a thoracentesis on 4.1.22. Pleural effusion found to be complex and loculated with only 100 ml of pleural fluid removed. Chest xray from 4.28.22 shows a loculated complex pleural effusion similar to post thoracentesis xray taken on 4.1.22. Team is requesting a therapeutic right sided thoracentesis. After review of imaging IR Attending Dr. Wanda Plump. Mir recommends a chest tube placement. This was communicated to pulmonology.  Procedure request discussed directly with the patient's son Janos Shampine wo requests that neither a chest tube placement of thoracentesis be performed at this time. Mr. GREYSEN DEVINO will discuss treatment options and goals with his sister and update the Primary Team when a decision has been made. This was communicated to Pulmonology.

## 2021-01-16 NOTE — Progress Notes (Signed)
PROGRESS NOTE  Eric Hendricks  YQM:[id number] DOB: 1924/03/02 DOA: 01/13/2021 PCP: [name], MD   Brief Narrative: Eric Hendricks is a 87 y.o. male with a history of 3L oxygen-dependent COPD, chronic HFrEF, Parkinson's disease, AFib, dementia, depression, HLD who presented to the ED after falling out of his bed at home with resultant left forehead abrasion. In the ED he was tachycardic with oxygen requirement above baseline at 4L O2. BNP 582. ECG with AFib with RVR. CT cervical spine and head without acute fracture, dislocation or hemorrhage. CTA chest revealed bilateral perihilar infiltrates/edema and right >> left pleural effusion. IR consulted for repeat thoracentesis and cardiology consulted for assistance with diuresis. Covid-19 screening PCR was positive. CRP 3.2, PCT <0.10, d-dimer 5.66. Remdesivir, decadron, IV lasix were started. Pulmonary consulted for consideration of chest tube. Palliative also consulted to navigate goals of care decisions as the patient has been followed by hospice PTA. They request time to consider options. Pt is on 5L O2.   Assessment & Plan: Active Problems:   Acute CHF (congestive heart failure) (HCC)  Recurrent right > left pleural effusion: D/w IR who reports loculations on U/S during last attempt, suspects chest tube may be necessary. PCT negative.  - PCCM consulted. Plan to Tx CHF and covid. Case discussed at length with IR and PCCM. The complexity of the fluid collection suggests low utility of thoracentesis, that chest tube would be more appropriate management at this time. This was offered and is being considered (vs. comfort measures)  Acute on chronic HFrEF: Echo Aug 2021 w/LVEF 30-35%, mod AS.  - Cardiology consulted. Continue lasix 60mg  IV BID. CXR repeated showing improvement in airspace opacities confirming edema. - PCCM added albumin to potentiate diuresis. - Monitor I/O, daily weights, metabolic panel.  - With soft BPs, holding entresto with  soft BP, renal impairment with need for aggressive diuresis.  Covid-19 pneumonia:  - Isolation x10 days. - Initiated remdesivir x5 days. CRP modestly elevated  - Continue decadron per PCCM - D-dimer showing improvement with typical Tx and no anticoagulation. Will not start anticoagulation with prohibitively elevated fall risk and risk for ICH.  PAF with RVR:  - Per cardiology, continue metoprolol, consider DCing amiodarone as pt has not maintained rhythm control. - Avoiding anticoagulation with high fall risk  Stage IIIa CKD: Putative Dx based on available CrCl.  - Avoid nephrotoxins if able.  CAD, HLD: +Moderate calcifications on CTA chest from March. No chest pain, cardiology does not suspect ACS. - Beta blocker   - Holding ASA with thoracentesis/chest tube pending and history of bloody output. - Continue high-intensity statin  Parkinson's disease, dementia, depression, very HOH:  - Continue sinemet - Continue aricept, namenda, remeron, depakote.   Fall at home: No lacerations requiring repair in ED, negative trauma C spine/CT head.  - PT, OT. Continues to require SNF.  Elevated TSH: 9.763, though free T4 wnl.  - No further work up currently planned.  DVT prophylaxis: SCDs Code Status: DNR Family Communication: None at bedside. Palliative and PCCM discussing care with Son daily Disposition Plan:  Status is: Inpatient  Remains inpatient appropriate because:Inpatient level of care appropriate due to severity of illness   Dispo: The patient is from: SNF              Anticipated d/c is to: SNF              Patient currently is not medically stable to d/c.   Difficult to place patient No  Consultants:   PCCM  Cardiology  Palliative care medicine team  IR  Procedures:   Thoracentesis requested.  Antimicrobials:  Remdesivir 4/27 >>   Subjective: Very HOH, not sure he'd want chest tube. Still some pain on the right side of his back. Not worse with  breathing. No exertional component.  Objective: Vitals:   01/16/21 0149 01/16/21 0438 01/16/21 0904 01/16/21 1310  BP:  92/65 117/80 111/61  Pulse: 82 81 94 72  Resp:  20 (!) 21 (!) 22  Temp:  97.9 F (36.6 C) 97.9 F (36.6 C) (!) 97.1 F (36.2 C)  TempSrc:  Axillary Oral Oral  SpO2: 93% 92%    Weight:        Intake/Output Summary (Last 24 hours) at 01/16/2021 1746 Last data filed at 01/16/2021 1041 Gross per 24 hour  Intake 340 ml  Output --  Net 340 ml   Filed Weights   01/14/21 0158 01/14/21 0903  Weight: 65.1 kg 62.4 kg   Gen: 87 y.o. male in no distress Pulm: Nonlabored breathing room air while sleeping, took off oxygen. SpO2 is 88-89%, no distress, slightly tachypneic. Very diminished R > L. No wheezes. CV: Irreg irreg. No murmur, rub, or gallop. No JVD, minimal dependent edema. GI: Abdomen soft, non-tender, non-distended, with normoactive bowel sounds.  Ext: Warm, no deformities Skin: No rashes, lesions or ulcers on visualized skin. Neuro: Alert and extremely HOH. No focal neurological deficits. Psych: Judgement and insight appear marginal.   Behavior is appropriate.    Data Reviewed: I have personally reviewed following labs and imaging studies  CBC: Recent Labs  Lab 01/13/21 2113 01/14/21 0512 01/15/21 0631 01/16/21 0439  WBC 6.8 5.0 5.1 5.9  NEUTROABS 6.1  --  4.3 5.5  HGB 11.2* 10.1* 10.7* 10.5*  HCT 36.1* 32.5* 35.3* 34.3*  MCV 97.3 96.4 98.1 96.9  PLT 220 192 209 188   Basic Metabolic Panel: Recent Labs  Lab 01/13/21 2113 01/13/21 2116 01/14/21 0512 01/15/21 0631 01/16/21 0439  NA 139  --  140 142 141  K 4.0  --  3.7 3.7 3.8  CL 101  --  101 99 101  CO2 26  --  29 31 30   GLUCOSE 115*  --  104* 96 129*  BUN 44*  --  42* 35* 36*  CREATININE 1.57*  --  1.43* 1.41* 1.16  CALCIUM 8.3*  --  8.0* 8.0* 7.9*  MG  --  2.6*  --   --   --    GFR: Estimated Creatinine Clearance: 34.4 mL/min (by C-G formula based on SCr of 1.16 mg/dL). Liver  Function Tests: Recent Labs  Lab 01/13/21 2113 01/15/21 0631 01/16/21 0439  AST 26 20 14*  ALT 6 9 12   ALKPHOS 77 64 54  BILITOT 0.7 0.7 0.7  PROT 7.2 6.3* 6.4*  ALBUMIN 3.1* 2.5* 2.6*   No results for input(s): LIPASE, AMYLASE in the last 168 hours. No results for input(s): AMMONIA in the last 168 hours. Coagulation Profile: No results for input(s): INR, PROTIME in the last 168 hours. Cardiac Enzymes: No results for input(s): CKTOTAL, CKMB, CKMBINDEX, TROPONINI in the last 168 hours. BNP (last 3 results) No results for input(s): PROBNP in the last 8760 hours. HbA1C: No results for input(s): HGBA1C in the last 72 hours. CBG: No results for input(s): GLUCAP in the last 168 hours. Lipid Profile: No results for input(s): CHOL, HDL, LDLCALC, TRIG, CHOLHDL, LDLDIRECT in the last 72 hours. Thyroid Function Tests: Recent Labs  01/13/21 2116 01/15/21 0631  TSH 9.763*  --   FREET4  --  0.90  T3FREE  --  1.8*   Anemia Panel: Recent Labs    01/14/21 0512  FERRITIN 156   Urine analysis:    Component Value Date/Time   COLORURINE YELLOW 09/26/2020 1331   APPEARANCEUR CLEAR 09/26/2020 1331   LABSPEC 1.020 09/26/2020 1331   PHURINE 6.5 09/26/2020 1331   GLUCOSEU NEGATIVE 09/26/2020 1331   HGBUR NEGATIVE 09/26/2020 1331   BILIRUBINUR NEGATIVE 09/26/2020 1331   KETONESUR NEGATIVE 09/26/2020 1331   PROTEINUR NEGATIVE 09/26/2020 1331   NITRITE NEGATIVE 09/26/2020 1331   LEUKOCYTESUR SMALL (A) 09/26/2020 1331   Recent Results (from the past 240 hour(s))  Resp Panel by RT-PCR (Flu A&B, Covid) Nasopharyngeal Swab     Status: Abnormal   Collection Time: 01/14/21 12:13 AM   Specimen: Nasopharyngeal Swab; Nasopharyngeal(NP) swabs in vial transport medium  Result Value Ref Range Status   SARS Coronavirus 2 by RT PCR POSITIVE (A) NEGATIVE Final    Comment: RESULT CALLED TO, READ BACK BY AND VERIFIED WITH: AMBER BIESECKER RN 0127 01/14/21 HNM (NOTE) SARS-CoV-2 target nucleic  acids are DETECTED.  The SARS-CoV-2 RNA is generally detectable in upper respiratory specimens during the acute phase of infection. Positive results are indicative of the presence of the identified virus, but do not rule out bacterial infection or co-infection with other pathogens not detected by the test. Clinical correlation with patient history and other diagnostic information is necessary to determine patient infection status. The expected result is Negative.  Fact Sheet for Patients: EntrepreneurPulse.com.au  Fact Sheet for Healthcare Providers: IncredibleEmployment.be  This test is not yet approved or cleared by the [place] FDA and  has been authorized for detection and/or diagnosis of SARS-CoV-2 by FDA under an Emergency Use Authorization (EUA).  This EUA will remain in effect (meaning this test can  be used) for the duration of  the COVID-19 declaration under Section 564(b)(1) of the Act, 21 U.S.C. section 360bbb-3(b)(1), unless the authorization is terminated or revoked sooner.     Influenza A by PCR NEGATIVE NEGATIVE Final   Influenza B by PCR NEGATIVE NEGATIVE Final    Comment: (NOTE) The Xpert Xpress SARS-CoV-2/FLU/RSV plus assay is intended as an aid in the diagnosis of influenza from Nasopharyngeal swab specimens and should not be used as a sole basis for treatment. Nasal washings and aspirates are unacceptable for Xpert Xpress SARS-CoV-2/FLU/RSV testing.  Fact Sheet for Patients: EntrepreneurPulse.com.au  Fact Sheet for Healthcare Providers: IncredibleEmployment.be  This test is not yet approved or cleared by the [place] FDA and has been authorized for detection and/or diagnosis of SARS-CoV-2 by FDA under an Emergency Use Authorization (EUA). This EUA will remain in effect (meaning this test can be used) for the duration of the COVID-19 declaration under Section 564(b)(1) of  the Act, 21 U.S.C. section 360bbb-3(b)(1), unless the authorization is terminated or revoked.  Performed at [facility], [address] [place], [place] [address]       Radiology Studies: DG Chest [place] 1 View  Result Date: 01/15/2021 CLINICAL DATA:  Hypoxic respiratory failure EXAM: PORTABLE CHEST 1 VIEW COMPARISON:  01/13/2021 FINDINGS: Large, partially loculated right pleural effusion is again identified, stable since prior examination with associated right basilar compressive atelectasis. Lung volumes are small. Superimposed perihilar interstitial edema, best appreciated within the left lung, appears slightly improved. No pneumothorax. No pleural effusion on the left. Left subclavian dual lead pacemaker is unchanged. Cardiac size  within normal limits. No acute bone abnormality. IMPRESSION: Stable large right partially loculated pleural effusion. Improving superimposed mild interstitial pulmonary edema Electronically Signed   By: Fidela Salisbury MD   On: 01/15/2021 01:46    Scheduled Meds: . ascorbic acid  500 mg Oral Daily  . atorvastatin  80 mg Oral QPM  . bacitracin   Topical BID  . carbidopa-levodopa  1 tablet Oral TID  . dexamethasone (DECADRON) injection  4 mg Intravenous Q12H  . divalproex  250 mg Oral TID  . donepezil  10 mg Oral QHS  . enoxaparin (LOVENOX) injection  30 mg Subcutaneous Q24H  . famotidine  20 mg Oral BID  . furosemide  60 mg Intravenous BID  . memantine  10 mg Oral BID  . metoprolol succinate  12.5 mg Oral Daily  . mirtazapine  15 mg Oral QHS   Continuous Infusions: . remdesivir 100 mg in NS 100 mL Stopped (01/16/21 0953)     LOS: 3 days   Time spent: 35 minutes.  Patrecia Pour, MD [facility] [email or web address] 01/16/2021, 5:46 PM

## 2021-01-17 ENCOUNTER — Inpatient Hospital Stay: Payer: Medicare Other

## 2021-01-17 ENCOUNTER — Encounter: Payer: Self-pay | Admitting: Family Medicine

## 2021-01-17 DIAGNOSIS — J9621 Acute and chronic respiratory failure with hypoxia: Secondary | ICD-10-CM | POA: Diagnosis not present

## 2021-01-17 DIAGNOSIS — U071 COVID-19: Secondary | ICD-10-CM | POA: Diagnosis not present

## 2021-01-17 DIAGNOSIS — I509 Heart failure, unspecified: Secondary | ICD-10-CM | POA: Diagnosis not present

## 2021-01-17 DIAGNOSIS — I5021 Acute systolic (congestive) heart failure: Secondary | ICD-10-CM | POA: Diagnosis not present

## 2021-01-17 LAB — CBC WITH DIFFERENTIAL/PLATELET
Abs Immature Granulocytes: 0.04 10*3/uL (ref 0.00–0.07)
Basophils Absolute: 0 10*3/uL (ref 0.0–0.1)
Basophils Relative: 0 %
Eosinophils Absolute: 0 10*3/uL (ref 0.0–0.5)
Eosinophils Relative: 0 %
HCT: 35.5 % — ABNORMAL LOW (ref 39.0–52.0)
Hemoglobin: 10.9 g/dL — ABNORMAL LOW (ref 13.0–17.0)
Immature Granulocytes: 1 %
Lymphocytes Relative: 2 %
Lymphs Abs: 0.1 10*3/uL — ABNORMAL LOW (ref 0.7–4.0)
MCH: 29.8 pg (ref 26.0–34.0)
MCHC: 30.7 g/dL (ref 30.0–36.0)
MCV: 97 fL (ref 80.0–100.0)
Monocytes Absolute: 0.4 10*3/uL (ref 0.1–1.0)
Monocytes Relative: 6 %
Neutro Abs: 7.2 10*3/uL (ref 1.7–7.7)
Neutrophils Relative %: 91 %
Platelets: 207 10*3/uL (ref 150–400)
RBC: 3.66 MIL/uL — ABNORMAL LOW (ref 4.22–5.81)
RDW: 18.2 % — ABNORMAL HIGH (ref 11.5–15.5)
WBC: 7.8 10*3/uL (ref 4.0–10.5)
nRBC: 0 % (ref 0.0–0.2)

## 2021-01-17 LAB — COMPREHENSIVE METABOLIC PANEL
ALT: 6 U/L (ref 0–44)
AST: 16 U/L (ref 15–41)
Albumin: 2.5 g/dL — ABNORMAL LOW (ref 3.5–5.0)
Alkaline Phosphatase: 54 U/L (ref 38–126)
Anion gap: 12 (ref 5–15)
BUN: 44 mg/dL — ABNORMAL HIGH (ref 8–23)
CO2: 30 mmol/L (ref 22–32)
Calcium: 8 mg/dL — ABNORMAL LOW (ref 8.9–10.3)
Chloride: 96 mmol/L — ABNORMAL LOW (ref 98–111)
Creatinine, Ser: 1.23 mg/dL (ref 0.61–1.24)
GFR, Estimated: 54 mL/min — ABNORMAL LOW (ref >60)
Glucose, Bld: 119 mg/dL — ABNORMAL HIGH (ref 70–99)
Potassium: 4.1 mmol/L (ref 3.5–5.1)
Sodium: 138 mmol/L (ref 135–145)
Total Bilirubin: 0.8 mg/dL (ref 0.3–1.2)
Total Protein: 6.4 g/dL — ABNORMAL LOW (ref 6.5–8.1)

## 2021-01-17 LAB — D-DIMER, QUANTITATIVE: D-Dimer, Quant: 5.98 ug/mL-FEU — ABNORMAL HIGH (ref 0.00–0.50)

## 2021-01-17 LAB — C-REACTIVE PROTEIN: CRP: 3 mg/dL — ABNORMAL HIGH (ref <1.0)

## 2021-01-17 MED ORDER — IOHEXOL 350 MG/ML SOLN
75.0000 mL | Freq: Once | INTRAVENOUS | Status: AC | PRN
  Administered 2021-01-17: 75 mL via INTRAVENOUS

## 2021-01-17 NOTE — Progress Notes (Signed)
Pulmonary Medicine          Date: 01/17/2021,   MRN# [id number] Eric Hendricks [birth year]     AdmissionWeight: 65.1 kg                 CurrentWeight: 64.8 kg   Referring physician: Dr Jarvis NewcomerGrunz   CHIEF COMPLAINT:   Acute on chronic hypoxemic respiratory failure with recurrent pleural effusion   HISTORY OF PRESENT ILLNESS   As per admission h/p this is a89 y.o. Caucasian male with medical history significant for systolic CHF, atrial fibrillation, Parkinson's disease, dyslipidemia, dementia and depression, who presented to emergency room after having an accidental fall with subsequent left forehead abrasion.  The patient apparently rolled from his bed while asleep and immediately woke up.  He denied any presyncope or syncope.  No chest pain or palpitations.  He had a small abrasion to the left shoulder as well.  No paresthesias or focal muscle weakness.  No dysuria, oliguria or hematuria or flank pain reported.  Is a fairly poor historian due to his dementia.  Per the patient's son he has apparently been followed by hospice at his SNF.   When he came to the ER heart rate was 115 with possibility of her percent on 4 L of O2 nasal cannula as his pulse oximetry was 88% on his baseline 3 L.  Vital signs were otherwise normal.  CMP was remarkable for a BUN of 44 and creatinine 1.57 compared to 36 and 1.42 on 12/18/2020.  BNP was 582.  CBC showed mild anemia close to baseline.  Respiratory panel is currently pending.  Head and C-spine CT showed no acute intracranial abnormalities and chronic small vessel ischemic disease with chronic atrophy and normal alignment of the C-spine with degenerative changes as well as bilateral pleural effusions CTA revealed large right pleural effusion with subtotal collapse of the right lung and small left pleural effusion, moderate coronary artery calcification, aortic atherosclerosis with no PE.  PCCM consultation placed for loculated right pleural effusion.  I  discussed case with Attending physician over phone after my evaluation of imaging and labs.  Patient is calm non tachypneic.  He diuresed well, he is COVID+ and had negative test last month so this seems to be new case.  He is on 3L  and with sPO2>95%  01/16/21- patient resting in bed in no distress. His BP is mildy low today.  I discussed with IR regarding thora/chest tube it seems that multiloculated effusion is precluding thoracentesis so we agreed on chest tube placement and IR team is in communication with POA son Theron Aristaeter.   01/17/21- patient is unchanged from previous. Family has reported they do not wish to have any procedure.   PAST MEDICAL HISTORY   Past Medical History:  Diagnosis Date  . Arrhythmia    atrial fibrillation  . B12 deficiency   . CHF (congestive heart failure) (HCC)   . Dementia (HCC)   . Depression   . Hyperlipemia   . Parkinson's disease (HCC)   . Prostate cancer [facility](HCC)      SURGICAL HISTORY   Past Surgical History:  Procedure Laterality Date  . ANKLE SURGERY Right   . APPENDECTOMY    . CATARACT EXTRACTION Bilateral   . TRANSURETHRAL RESECTION OF PROSTATE       FAMILY HISTORY   Family History  Problem Relation Age of Onset  . Colon cancer Father   . Tremor Mother   . Parkinson's disease Brother   .  Prostate cancer Brother   . Lung cancer Sister   . Emphysema Sister   . Heart failure Child   . Diabetes Child   . AAA (abdominal aortic aneurysm) Child      SOCIAL HISTORY   Social History   Tobacco Use  . Smoking status: Never Smoker  . Smokeless tobacco: Never Used  Vaping Use  . Vaping Use: Never used  Substance Use Topics  . Alcohol use: No    Alcohol/week: 0.0 standard drinks  . Drug use: No     MEDICATIONS    Home Medication:    Current Medication:  Current Facility-Administered Medications:  .  acetaminophen (TYLENOL) tablet 650 mg, 650 mg, Oral, Q6H PRN **OR** acetaminophen (TYLENOL) suppository 650 mg, 650 mg,  Rectal, Q6H PRN, Mansy, Jan A, MD .  ascorbic acid (VITAMIN C) tablet 500 mg, 500 mg, Oral, Daily, Mansy, Jan A, MD, 500 mg at 01/17/21 0831 .  atorvastatin (LIPITOR) tablet 80 mg, 80 mg, Oral, QPM, Mansy, Jan A, MD, 80 mg at 01/17/21 1731 .  bacitracin ointment, , Topical, BID, Patrecia Pour, MD, Given at 01/17/21 [id number] .  carbidopa-levodopa (SINEMET IR) 25-100 MG per tablet immediate release 1 tablet, 1 tablet, Oral, TID, Mansy, Jan A, MD, 1 tablet at 01/17/21 1344 .  dexamethasone (DECADRON) injection 4 mg, 4 mg, Intravenous, Q12H, Ottie Glazier, MD, 4 mg at 01/17/21 0828 .  divalproex (DEPAKOTE) DR tablet 250 mg, 250 mg, Oral, TID, Mansy, Jan A, MD, 250 mg at 01/17/21 1730 .  docusate sodium (COLACE) capsule 100 mg, 100 mg, Oral, BID PRN, Mansy, Jan A, MD .  donepezil (ARICEPT) tablet 10 mg, 10 mg, Oral, QHS, Mansy, Jan A, MD, 10 mg at 01/16/21 2020 .  enoxaparin (LOVENOX) injection 30 mg, 30 mg, Subcutaneous, Q24H, Mansy, Jan A, MD, 30 mg at 01/17/21 0849 .  famotidine (PEPCID) tablet 20 mg, 20 mg, Oral, BID, Mansy, Jan A, MD, 20 mg at 01/17/21 0831 .  furosemide (LASIX) injection 60 mg, 60 mg, Intravenous, BID, Mansy, Jan A, MD, 60 mg at 01/17/21 1731 .  magnesium hydroxide (MILK OF MAGNESIA) suspension 30 mL, 30 mL, Oral, Daily PRN, Mansy, Jan A, MD .  memantine [facility]) tablet 10 mg, 10 mg, Oral, BID, Mansy, Jan A, MD, 10 mg at 01/17/21 0830 .  metoprolol succinate (TOPROL-XL) 24 hr tablet 12.5 mg, 12.5 mg, Oral, Daily, Mansy, Jan A, MD, 12.5 mg at 01/17/21 0830 .  mirtazapine (REMERON) tablet 15 mg, 15 mg, Oral, QHS, Mansy, Jan A, MD, 15 mg at 01/16/21 2021 .  ondansetron (ZOFRAN) tablet 4 mg, 4 mg, Oral, Q6H PRN **OR** ondansetron (ZOFRAN) injection 4 mg, 4 mg, Intravenous, Q6H PRN, Mansy, Jan A, MD .  polyvinyl alcohol (LIQUIFILM TEARS) 1.4 % ophthalmic solution 2 drop, 2 drop, Both Eyes, Q4H PRN, Mansy, Jan A, MD .  [COMPLETED] remdesivir 200 mg in sodium chloride 0.9% 250 mL IVPB,  200 mg, Intravenous, Once, Stopped at 01/14/21 2110 **FOLLOWED BY** remdesivir 100 mg in sodium chloride 0.9 % 100 mL IVPB, 100 mg, Intravenous, Daily, Rauer, Forde Dandy, RPH, Last Rate: 200 mL/hr at 01/17/21 0838, 100 mg at 01/17/21 0838 .  traZODone (DESYREL) tablet 25 mg, 25 mg, Oral, QHS PRN, Mansy, Jan A, MD, 25 mg at 01/15/21 2116    ALLERGIES   Phenylephrine-guaifenesin, Aspirin, Guaifenesin, Guanfacine, Phenylephrine, and Suprax [cefixime]     REVIEW OF SYSTEMS    Review of Systems:  Gen:  Denies  fever, sweats, chills  weigh loss  HEENT: Denies blurred vision, double vision, ear pain, eye pain, hearing loss, nose bleeds, sore throat Cardiac:  No dizziness, chest pain or heaviness, chest tightness,edema Resp:   Denies cough or sputum porduction, shortness of breath,wheezing, hemoptysis,  Gi: Denies swallowing difficulty, stomach pain, nausea or vomiting, diarrhea, constipation, bowel incontinence Gu:  Denies bladder incontinence, burning urine Ext:   Denies Joint pain, stiffness or swelling Skin: Denies  skin rash, easy bruising or bleeding or hives Endoc:  Denies polyuria, polydipsia , polyphagia or weight change Psych:   Denies depression, insomnia or hallucinations   Other:  All other systems negative   VS: BP 101/63 (BP Location: Right Arm)   Pulse 77   Temp 98.1 F (36.7 C) (Oral)   Resp 16   Wt 64.8 kg   SpO2 96%   BMI 21.71 kg/m      PHYSICAL EXAM    GENERAL:NAD, no fevers, chills, no weakness no fatigue HEAD: Normocephalic, atraumatic.  EYES: Pupils equal, round, reactive to light. Extraocular muscles intact. No scleral icterus.  MOUTH: Moist mucosal membrane. Dentition intact. No abscess noted.  EAR, NOSE, THROAT: Clear without exudates. No external lesions.  NECK: Supple. No thyromegaly. No nodules. No JVD.  PULMONARY: decreased air entry worse on right CARDIOVASCULAR: S1 and S2. Regular rate and rhythm. No murmurs, rubs, or gallops. No edema.  Pedal pulses 2+ bilaterally.  GASTROINTESTINAL: Soft, nontender, nondistended. No masses. Positive bowel sounds. No hepatosplenomegaly.  MUSCULOSKELETAL: No swelling, clubbing, or edema. Range of motion full in all extremities.  NEUROLOGIC: Cranial nerves II through XII are intact. No gross focal neurological deficits. Sensation intact. Reflexes intact.  SKIN: No ulceration, lesions, rashes, or cyanosis. Skin warm and dry. Turgor intact.  PSYCHIATRIC: Mood, affect within normal limits. The patient is awake, alert and oriented x 3. Insight, judgment intact.       IMAGING    CT Head Wo Contrast  Result Date: 01/13/2021 CLINICAL DATA:  [place] with bruises to the forehead.  Neck trauma. EXAM: CT HEAD WITHOUT CONTRAST CT CERVICAL SPINE WITHOUT CONTRAST TECHNIQUE: Multidetector CT imaging of the head and cervical spine was performed following the standard protocol without intravenous contrast. Multiplanar CT image reconstructions of the cervical spine were also generated. COMPARISON:  CT head and cervical spine 07/31/2018 FINDINGS: CT HEAD FINDINGS Brain: No evidence of acute infarction, hemorrhage, hydrocephalus, extra-axial collection or mass lesion/mass effect. Diffuse cerebral atrophy. Ventricular dilatation likely due to central atrophy. Patchy low-attenuation changes in the deep white matter consistent small vessel ischemia. Vascular: Moderate intracranial arterial vascular calcifications. Skull: Calvarium appears intact. Sinuses/Orbits: Paranasal sinuses and mastoid air cells are clear. Other: None. CT CERVICAL SPINE FINDINGS Alignment: Normal alignment Skull base and vertebrae: No acute fracture. No primary bone lesion or focal pathologic process. Soft tissues and spinal canal: No prevertebral fluid or swelling. No visible canal hematoma. Disc levels: Degenerative changes with narrowed interspaces and endplate hypertrophic change most prominent at C5-6 and C6-7 levels. Prominent ligamentous  calcifications. Degenerative changes in the facet joints. Upper chest: Bilateral pleural effusions are demonstrated in the apices mid Other: Spur calcifications IMPRESSION: 1. No acute intracranial abnormalities. Chronic atrophy and small vessel ischemic changes. 2. Normal alignment of the cervical spine. Degenerative changes. No acute displaced fractures identified. 3. Bilateral pleural effusions. Electronically Signed   By: Lucienne Capers M.D.   On: 01/13/2021 22:06   CT ANGIO CHEST PE W OR WO CONTRAST  Result Date: 01/17/2021 CLINICAL DATA:  Shortness of breath EXAM: CT  ANGIOGRAPHY CHEST WITH CONTRAST TECHNIQUE: Multidetector CT imaging of the chest was performed using the standard protocol during bolus administration of intravenous contrast. Multiplanar CT image reconstructions and MIPs were obtained to evaluate the vascular anatomy. CONTRAST:  31mL OMNIPAQUE IOHEXOL 350 MG/ML SOLN COMPARISON:  12/15/2020 FINDINGS: Cardiovascular: Satisfactory opacification of the pulmonary arteries to the segmental level. No evidence of pulmonary embolism. Stable heart size. Left chest wall dual lead pacemaker. No pericardial effusion. Coronary artery calcification. Mediastinum/Nodes: No enlarged lymph nodes. Esophagus is unremarkable. Lungs/Pleura: Decreased partially loculated moderate to large right pleural effusion. Increased moderate effusion. Persistent near complete atelectasis right middle and lower lobes. Partial right upper lobe atelectasis. Partial left lower lobe atelectasis. Upper Abdomen: No acute abnormality. Musculoskeletal: No acute osseous abnormality. Review of the MIP images confirms the above findings. IMPRESSION: No evidence of acute pulmonary embolism. Decreased moderate to large right pleural effusion with improved right lung aeration. Persistent near total atelectasis of the right lower and middle lobes. Increased moderate left pleural effusion with partial left lower lobe atelectasis.  Electronically Signed   By: Macy Mis M.D.   On: 01/17/2021 11:24   CT Cervical Spine Wo Contrast  Result Date: 01/13/2021 CLINICAL DATA:  [place] with bruises to the forehead.  Neck trauma. EXAM: CT HEAD WITHOUT CONTRAST CT CERVICAL SPINE WITHOUT CONTRAST TECHNIQUE: Multidetector CT imaging of the head and cervical spine was performed following the standard protocol without intravenous contrast. Multiplanar CT image reconstructions of the cervical spine were also generated. COMPARISON:  CT head and cervical spine 07/31/2018 FINDINGS: CT HEAD FINDINGS Brain: No evidence of acute infarction, hemorrhage, hydrocephalus, extra-axial collection or mass lesion/mass effect. Diffuse cerebral atrophy. Ventricular dilatation likely due to central atrophy. Patchy low-attenuation changes in the deep white matter consistent small vessel ischemia. Vascular: Moderate intracranial arterial vascular calcifications. Skull: Calvarium appears intact. Sinuses/Orbits: Paranasal sinuses and mastoid air cells are clear. Other: None. CT CERVICAL SPINE FINDINGS Alignment: Normal alignment Skull base and vertebrae: No acute fracture. No primary bone lesion or focal pathologic process. Soft tissues and spinal canal: No prevertebral fluid or swelling. No visible canal hematoma. Disc levels: Degenerative changes with narrowed interspaces and endplate hypertrophic change most prominent at C5-6 and C6-7 levels. Prominent ligamentous calcifications. Degenerative changes in the facet joints. Upper chest: Bilateral pleural effusions are demonstrated in the apices mid Other: Spur calcifications IMPRESSION: 1. No acute intracranial abnormalities. Chronic atrophy and small vessel ischemic changes. 2. Normal alignment of the cervical spine. Degenerative changes. No acute displaced fractures identified. 3. Bilateral pleural effusions. Electronically Signed   By: Lucienne Capers M.D.   On: 01/13/2021 22:06   DG Chest Port 1 View  Result Date:  01/15/2021 CLINICAL DATA:  Hypoxic respiratory failure EXAM: PORTABLE CHEST 1 VIEW COMPARISON:  01/13/2021 FINDINGS: Large, partially loculated right pleural effusion is again identified, stable since prior examination with associated right basilar compressive atelectasis. Lung volumes are small. Superimposed perihilar interstitial edema, best appreciated within the left lung, appears slightly improved. No pneumothorax. No pleural effusion on the left. Left subclavian dual lead pacemaker is unchanged. Cardiac size within normal limits. No acute bone abnormality. IMPRESSION: Stable large right partially loculated pleural effusion. Improving superimposed mild interstitial pulmonary edema Electronically Signed   By: Fidela Salisbury MD   On: 01/15/2021 01:46   DG Chest Port 1 View  Result Date: 01/13/2021 CLINICAL DATA:  Cough and shortness of breath.  Weakness and fall. EXAM: PORTABLE CHEST 1 VIEW COMPARISON:  12/19/2020 FINDINGS: Cardiac pacemaker. Shallow inspiration. Heart  size is obscured but appears enlarged. Bilateral perihilar infiltration/edema. Moderate right pleural effusion. Progression of changes since previous study. Calcification of the aorta. IMPRESSION: Progression of bilateral perihilar infiltration/edema and right pleural effusion. Electronically Signed   By: Lucienne Capers M.D.   On: 01/13/2021 21:28   DG Chest Port 1 View  Result Date: 12/19/2020 CLINICAL DATA:  Recurrent right pleural effusion and status post thoracentesis. EXAM: PORTABLE CHEST 1 VIEW COMPARISON:  From earlier today at 1126 hours FINDINGS: Stable heart size and appearance of dual-chamber pacemaker. Decrease in pleural fluid since the prior chest x-ray with some residual partially loculated fluid remaining. No pneumothorax. Improved aeration of the right lung. IMPRESSION: Decrease in right pleural fluid after thoracentesis without pneumothorax. Improved aeration of the right lung. Electronically Signed   By: Aletta Edouard  M.D.   On: 12/19/2020 16:29   DG Chest Port 1 View  Result Date: 12/19/2020 CLINICAL DATA:  Dyspnea EXAM: PORTABLE CHEST 1 VIEW COMPARISON:  12/16/2020 FINDINGS: Dual lead pacer. Patient rotated right. Midline trachea. Mild cardiomegaly. Atherosclerosis in the transverse aorta. Moderate to large right pleural effusion with suggestion of loculation laterally, increased. No left pleural effusion. No pneumothorax. Mild pulmonary venous congestion. Increased right and similar left base airspace disease. IMPRESSION: Increase in moderate to large right pleural effusion with suggestion of loculation laterally. Adjacent right base airspace disease which could represent atelectasis or infection. Similar left base Airspace disease, likely atelectasis. Aortic Atherosclerosis (ICD10-I70.0). Electronically Signed   By: Abigail Miyamoto M.D.   On: 12/19/2020 11:48   US THORACENTESIS ASP PLEURAL SPACE W/IMG GUIDE  Result Date: 12/19/2020 INDICATION: Patient with a history of CHF and recurrent pleural effusions. Interventional radiology asked to perform a therapeutic thoracentesis. EXAM: ULTRASOUND GUIDED THORACENTESIS MEDICATIONS: 1% lidocaine 10 mL COMPLICATIONS: None immediate. PROCEDURE: An ultrasound guided thoracentesis was thoroughly discussed with the patient and questions answered. The benefits, risks, alternatives and complications were also discussed. The patient understands and wishes to proceed with the procedure. Written consent was obtained. Ultrasound was performed to localize and mark an adequate pocket of fluid in the right chest. The area was then prepped and draped in the normal sterile fashion. 1% Lidocaine was used for local anesthesia. Under ultrasound guidance a 6 Fr Safe-T-Centesis catheter was introduced. Thoracentesis was performed. The catheter was removed and a dressing applied. FINDINGS: A total of approximately 1.1 L of bloody fluid was removed. IMPRESSION: Successful ultrasound guided right  thoracentesis yielding 0.1 L of pleural fluid. Read by: Soyla Dryer, NP Electronically Signed   By: Aletta Edouard M.D.   On: 12/19/2020 16:29      ASSESSMENT/PLAN   Acute on chronic hypoxemic respiratory failure Acute COVID19 pneumonia -Remdesevir antiviral - pharmacy protocol 5 d -vitamin C -zinc -decadron 4 mg IV daily  -Diuresis - Lasix 60 IV - monitor UOP - utilize external urinary catheter if possible -No need to  Self prone -encourage to use IS and Acapella device for bronchopulmonary hygiene when able -d/c hepatotoxic medications while on remdesevir -supportive care with ICU telemetry monitoring -PT/OT when possible -procalcitonin, CRP, ddimer and ferritin trending   Acute on chronic HFrEF 30-35%  - cardiology on case appreciate input  - continuing diuresis - 1200cc uop overnight -he is malnourished and has low albumin , will add once daily IV albumin while on lasix to potentiate diuresis  -continue statins -PT/OT - high fall risk - no anticoagulation    Right pleural effusion   - no signs of bacterial pneumonia and  low suspicion for para-pneumonic effusion at this time due to absence of leukocytosis, fevers, bilateral infiltrates in context of viral pneumonia induced CHF exacerbation -agree with diuresis and medical management at this time -previous thoracentesis with 2L of blood -please order fluid studies on thoracentesis  -there is a loculated component to effusion which complicates things and may require chest tube after all.  I suspect this may be due to previous bloody aspirate on thoracentesis. -He has CKD which makes diuresis more complicated.  Overall due to age and DNR status I prefer conservative approach. First we should do diagnostic thoracentesis with all fluid studies sent off for cytology and microbiology.  I will also ask palliative to consult due to possibly needing a chest tube which may be too aggressive for this centurian.      Thank you  for allowing me to participate in the care of this patient.     Patient/Family are satisfied with care plan and all questions have been answered.  This document was prepared using Dragon voice recognition software and may include unintentional dictation errors.     Ottie Glazier, M.D.  Division of [place]

## 2021-01-17 NOTE — Progress Notes (Signed)
PROGRESS NOTE  Eric Hendricks  IPJ:[id number] DOB: [birth year] DOA: 01/13/2021 PCP: [name], MD   Brief Narrative: Eric Hendricks is a 87 y.o. male with a history of 3L oxygen-dependent COPD, chronic HFrEF, Parkinson's disease, AFib, dementia, depression, HLD who presented to the ED after falling out of his bed at home with resultant left forehead abrasion. In the ED he was tachycardic with oxygen requirement above baseline at 4L O2. BNP 582. ECG with AFib with RVR. CT cervical spine and head without acute fracture, dislocation or hemorrhage. CTA chest revealed bilateral perihilar infiltrates/edema and right >> left pleural effusion. IR consulted for repeat thoracentesis and cardiology consulted for assistance with diuresis. Covid-19 screening PCR was positive. CRP 3.2, PCT <0.10, d-dimer 5.66. Remdesivir, decadron, IV lasix were started. Pulmonary consulted for consideration of chest tube. Palliative also consulted to navigate goals of care decisions as the patient has been followed by hospice PTA. They request time to consider options. Pt is on 5L O2.   Assessment & Plan: Active Problems:   Acute CHF (congestive heart failure) (HCC)  Acute on chronic hypoxic respiratory failure: Multifactorial.  - Treat etiologies listed below - Continue supplemental oxygen to maintain normal work of breathing and SpO2 >88%. - d-dimer remains grossly elevated and pt without improvement with aggressive diuresis and covid Tx. CrCl improved and stable, will check CTA chest for PE r/o and to further define effusion, parenchyma now that diuresis has had a few days.  Recurrent right > left pleural effusion: D/w IR who reports loculations on U/S during last attempt, suspects chest tube may be necessary. PCT negative.  - PCCM consulted. Plan to Tx CHF and covid. Case discussed at length with IR and PCCM. The complexity of the fluid collection suggests low utility of thoracentesis, that chest tube would be more  appropriate management at this time. This was offered and is being considered (vs. comfort measures) by family and patient.  Acute on chronic HFrEF: Echo Aug 2021 w/LVEF 30-35%, mod AS.  - Cardiology consulted. Continue lasix 60mg  IV BID. CXR repeated showed improvement in airspace opacities confirming edema. CTA chest today as above. - Monitor I/O, daily weights, metabolic panel.  - With soft BPs, holding entresto   Covid-19 pneumonia:  - Isolation x10 days. - Initiated remdesivir x5 days. CRP modestly elevated, repeat is pending. - Continue decadron  PAF with RVR:  - Per cardiology, continue metoprolol, consider DCing amiodarone as pt has not maintained rhythm control. - Avoiding anticoagulation with high fall risk. May consider anticoagulation if it would help dyspnea if due to PE.  Stage IIIa CKD: Putative Dx based on available CrCl.  - Avoid nephrotoxins if able.  CAD, HLD: +Moderate calcifications on CTA chest from March. No chest pain, cardiology does not suspect ACS. - Beta blocker   - Holding ASA with thoracentesis/chest tube pending and history of bloody output. - Continue high-intensity statin  Parkinson's disease, dementia, depression, very HOH:  - Continue sinemet - Continue aricept, namenda, remeron, depakote.   Fall at home: No lacerations requiring repair in ED, negative trauma C spine/CT head.  - PT, OT. Continues to require SNF.  Elevated TSH: 9.763, though free T4 wnl.  - No further work up currently planned.  DVT prophylaxis: SCDs Code Status: DNR Family Communication: None at bedside. Palliative and PCCM discussing care with Son daily Disposition Plan:  Status is: Inpatient  Remains inpatient appropriate because:Inpatient level of care appropriate due to severity of illness   Dispo: The patient is  from: SNF              Anticipated d/c is to: SNF              Patient currently is not medically stable to d/c.   Difficult to place patient  No  Consultants:   PCCM  Cardiology  Palliative care medicine team  IR  Procedures:   None  Antimicrobials:  Remdesivir 4/27 - 5/1   Subjective: HOH but able to hear a bit better this morning for some reason. He states he feels no better than before. No fevers. Shortness of breath at rest is moderate, severe with exertion and limits mobility, not associated with chest pain, though right back pain is stable.   Objective: Vitals:   01/16/21 1310 01/16/21 2027 01/17/21 0000 01/17/21 0400  BP: 111/61 98/64    Pulse: 72 88    Resp: (!) 22 20 16 20   Temp: (!) 97.1 F (36.2 C) 98.6 F (37 C)    TempSrc: Oral Oral    SpO2:  95%    Weight:        Intake/Output Summary (Last 24 hours) at 01/17/2021 0910 Last data filed at 01/17/2021 0550 Gross per 24 hour  Intake 440 ml  Output 600 ml  Net -160 ml   Filed Weights   01/14/21 0158 01/14/21 0903  Weight: 65.1 kg 62.4 kg   Gen: Very elderly pleasant male in no distress Pulm: Nonlabored breathing supplemental oxygen, diminished R>L, no wheezes. CV: Irreg irreg. No murmur, rub, or gallop. No JVD, trace, minimal dependent edema. GI: Abdomen soft, non-tender, non-distended, with normoactive bowel sounds.  Ext: Warm, no deformities Skin: No new rashes, lesions or ulcers on visualized skin. Neuro: Alert and mostly oriented. HOH stable, no focal neurological deficits. Psych: Judgement and insight appear fair. Mood euthymic & affect congruent. Behavior is appropriate.    Data Reviewed: I have personally reviewed following labs and imaging studies  CBC: Recent Labs  Lab 01/13/21 2113 01/14/21 0512 01/15/21 0631 01/16/21 0439 01/17/21 0429  WBC 6.8 5.0 5.1 5.9 7.8  NEUTROABS 6.1  --  4.3 5.5 7.2  HGB 11.2* 10.1* 10.7* 10.5* 10.9*  HCT 36.1* 32.5* 35.3* 34.3* 35.5*  MCV 97.3 96.4 98.1 96.9 97.0  PLT 220 192 209 218 409   Basic Metabolic Panel: Recent Labs  Lab 01/13/21 2113 01/13/21 2116 01/14/21 0512  01/15/21 0631 01/16/21 0439 01/17/21 0429  NA 139  --  140 142 141 138  K 4.0  --  3.7 3.7 3.8 4.1  CL 101  --  101 99 101 96*  CO2 26  --  29 31 30 30   GLUCOSE 115*  --  104* 96 129* 119*  BUN 44*  --  42* 35* 36* 44*  CREATININE 1.57*  --  1.43* 1.41* 1.16 1.23  CALCIUM 8.3*  --  8.0* 8.0* 7.9* 8.0*  MG  --  2.6*  --   --   --   --    GFR: Estimated Creatinine Clearance: 32.4 mL/min (by C-G formula based on SCr of 1.23 mg/dL). Liver Function Tests: Recent Labs  Lab 01/13/21 2113 01/15/21 0631 01/16/21 0439 01/17/21 0429  AST 26 20 14* 16  ALT 6 9 12 6   ALKPHOS 77 64 54 54  BILITOT 0.7 0.7 0.7 0.8  PROT 7.2 6.3* 6.4* 6.4*  ALBUMIN 3.1* 2.5* 2.6* 2.5*   Thyroid Function Tests: Recent Labs    01/15/21 0631  FREET4 0.90  T3FREE 1.8*  Recent Results (from the past 240 hour(s))  Resp Panel by RT-PCR (Flu A&B, Covid) Nasopharyngeal Swab     Status: Abnormal   Collection Time: 01/14/21 12:13 AM   Specimen: Nasopharyngeal Swab; Nasopharyngeal(NP) swabs in vial transport medium  Result Value Ref Range Status   SARS Coronavirus 2 by RT PCR POSITIVE (A) NEGATIVE Final    Comment: RESULT CALLED TO, READ BACK BY AND VERIFIED WITH: AMBER BIESECKER RN 0127 01/14/21 HNM (NOTE) SARS-CoV-2 target nucleic acids are DETECTED.  The SARS-CoV-2 RNA is generally detectable in upper respiratory specimens during the acute phase of infection. Positive results are indicative of the presence of the identified virus, but do not rule out bacterial infection or co-infection with other pathogens not detected by the test. Clinical correlation with patient history and other diagnostic information is necessary to determine patient infection status. The expected result is Negative.  Fact Sheet for Patients: EntrepreneurPulse.com.au  Fact Sheet for Healthcare Providers: IncredibleEmployment.be  This test is not yet approved or cleared by the [place]  FDA and  has been authorized for detection and/or diagnosis of SARS-CoV-2 by FDA under an Emergency Use Authorization (EUA).  This EUA will remain in effect (meaning this test can  be used) for the duration of  the COVID-19 declaration under Section 564(b)(1) of the Act, 21 U.S.C. section 360bbb-3(b)(1), unless the authorization is terminated or revoked sooner.     Influenza A by PCR NEGATIVE NEGATIVE Final   Influenza B by PCR NEGATIVE NEGATIVE Final    Comment: (NOTE) The Xpert Xpress SARS-CoV-2/FLU/RSV plus assay is intended as an aid in the diagnosis of influenza from Nasopharyngeal swab specimens and should not be used as a sole basis for treatment. Nasal washings and aspirates are unacceptable for Xpert Xpress SARS-CoV-2/FLU/RSV testing.  Fact Sheet for Patients: EntrepreneurPulse.com.au  Fact Sheet for Healthcare Providers: IncredibleEmployment.be  This test is not yet approved or cleared by the [place] FDA and has been authorized for detection and/or diagnosis of SARS-CoV-2 by FDA under an Emergency Use Authorization (EUA). This EUA will remain in effect (meaning this test can be used) for the duration of the COVID-19 declaration under Section 564(b)(1) of the Act, 21 U.S.C. section 360bbb-3(b)(1), unless the authorization is terminated or revoked.  Performed at [facility], [address], [place], [place] [address]       Radiology Studies: No results found.  Scheduled Meds: . ascorbic acid  500 mg Oral Daily  . atorvastatin  80 mg Oral QPM  . bacitracin   Topical BID  . carbidopa-levodopa  1 tablet Oral TID  . dexamethasone (DECADRON) injection  4 mg Intravenous Q12H  . divalproex  250 mg Oral TID  . donepezil  10 mg Oral QHS  . enoxaparin (LOVENOX) injection  30 mg Subcutaneous Q24H  . famotidine  20 mg Oral BID  . furosemide  60 mg Intravenous BID  . memantine  10 mg Oral BID  . metoprolol succinate   12.5 mg Oral Daily  . mirtazapine  15 mg Oral QHS   Continuous Infusions: . remdesivir 100 mg in NS 100 mL 100 mg (01/17/21 0838)     LOS: 4 days   Time spent: 35 minutes.  Patrecia Pour, MD [facility] [email or web address] 01/17/2021, 9:10 AM

## 2021-01-17 NOTE — Progress Notes (Signed)
Mobility Specialist - Progress Note   01/17/21 1200  Mobility  Activity Transferred:  Bed to chair  Level of Assistance Minimal assist, patient does 75% or more  Assistive Device Front wheel walker  Distance Ambulated (ft) 2 ft  Mobility Response Tolerated well  Mobility performed by Mobility specialist  $Mobility charge 1 Mobility    Pt transferred from bed-recliner with minA +1 and RW. No LOB. RN in for session and assisted. O2 98%, HR 82 bpm. No s/s of distress. Chair alarm set.   Kathee Delton Mobility Specialist 01/17/21, 12:34 PM

## 2021-01-18 DIAGNOSIS — J9621 Acute and chronic respiratory failure with hypoxia: Secondary | ICD-10-CM | POA: Diagnosis not present

## 2021-01-18 DIAGNOSIS — I509 Heart failure, unspecified: Secondary | ICD-10-CM | POA: Diagnosis not present

## 2021-01-18 DIAGNOSIS — U071 COVID-19: Secondary | ICD-10-CM | POA: Diagnosis not present

## 2021-01-18 DIAGNOSIS — I5021 Acute systolic (congestive) heart failure: Secondary | ICD-10-CM | POA: Diagnosis not present

## 2021-01-18 LAB — CBC WITH DIFFERENTIAL/PLATELET
Abs Immature Granulocytes: 0.03 10*3/uL (ref 0.00–0.07)
Basophils Absolute: 0 10*3/uL (ref 0.0–0.1)
Basophils Relative: 0 %
Eosinophils Absolute: 0 10*3/uL (ref 0.0–0.5)
Eosinophils Relative: 0 %
HCT: 35 % — ABNORMAL LOW (ref 39.0–52.0)
Hemoglobin: 11.1 g/dL — ABNORMAL LOW (ref 13.0–17.0)
Immature Granulocytes: 0 %
Lymphocytes Relative: 2 %
Lymphs Abs: 0.1 10*3/uL — ABNORMAL LOW (ref 0.7–4.0)
MCH: 29.9 pg (ref 26.0–34.0)
MCHC: 31.7 g/dL (ref 30.0–36.0)
MCV: 94.3 fL (ref 80.0–100.0)
Monocytes Absolute: 0.3 10*3/uL (ref 0.1–1.0)
Monocytes Relative: 4 %
Neutro Abs: 7.1 10*3/uL (ref 1.7–7.7)
Neutrophils Relative %: 94 %
Platelets: 199 10*3/uL (ref 150–400)
RBC: 3.71 MIL/uL — ABNORMAL LOW (ref 4.22–5.81)
RDW: 17.7 % — ABNORMAL HIGH (ref 11.5–15.5)
WBC: 7.5 10*3/uL (ref 4.0–10.5)
nRBC: 0 % (ref 0.0–0.2)

## 2021-01-18 LAB — COMPREHENSIVE METABOLIC PANEL
ALT: 6 U/L (ref 0–44)
AST: 23 U/L (ref 15–41)
Albumin: 2.4 g/dL — ABNORMAL LOW (ref 3.5–5.0)
Alkaline Phosphatase: 54 U/L (ref 38–126)
Anion gap: 11 (ref 5–15)
BUN: 49 mg/dL — ABNORMAL HIGH (ref 8–23)
CO2: 30 mmol/L (ref 22–32)
Calcium: 7.6 mg/dL — ABNORMAL LOW (ref 8.9–10.3)
Chloride: 96 mmol/L — ABNORMAL LOW (ref 98–111)
Creatinine, Ser: 1.2 mg/dL (ref 0.61–1.24)
GFR, Estimated: 56 mL/min — ABNORMAL LOW (ref >60)
Glucose, Bld: 134 mg/dL — ABNORMAL HIGH (ref 70–99)
Potassium: 4 mmol/L (ref 3.5–5.1)
Sodium: 137 mmol/L (ref 135–145)
Total Bilirubin: 0.8 mg/dL (ref 0.3–1.2)
Total Protein: 5.9 g/dL — ABNORMAL LOW (ref 6.5–8.1)

## 2021-01-18 LAB — C-REACTIVE PROTEIN: CRP: 2.2 mg/dL — ABNORMAL HIGH (ref <1.0)

## 2021-01-18 LAB — D-DIMER, QUANTITATIVE: D-Dimer, Quant: 3.45 ug/mL-FEU — ABNORMAL HIGH (ref 0.00–0.50)

## 2021-01-18 MED ORDER — METHYLPREDNISOLONE SODIUM SUCC 40 MG IJ SOLR
40.0000 mg | Freq: Three times a day (TID) | INTRAMUSCULAR | Status: DC
  Administered 2021-01-18 – 2021-01-20 (×7): 40 mg via INTRAVENOUS
  Filled 2021-01-18 (×7): qty 1

## 2021-01-18 MED ORDER — ENOXAPARIN SODIUM 40 MG/0.4ML IJ SOSY
40.0000 mg | PREFILLED_SYRINGE | INTRAMUSCULAR | Status: DC
  Administered 2021-01-19: 40 mg via SUBCUTANEOUS
  Filled 2021-01-18: qty 0.4

## 2021-01-18 NOTE — Progress Notes (Signed)
PROGRESS NOTE  Eric Hendricks  GEX:[id number] DOB: Jan 29, 1924 DOA: 01/13/2021 PCP: [name], MD   Brief Narrative: Eric Hendricks is a 87 y.o. male with a history of 3L oxygen-dependent COPD, chronic HFrEF, Parkinson's disease, AFib, dementia, depression, HLD who presented to the ED after falling out of his bed at home with resultant left forehead abrasion. In the ED he was tachycardic with oxygen requirement above baseline at 4L O2. BNP 582. ECG with AFib with RVR. CT cervical spine and head without acute fracture, dislocation or hemorrhage. CTA chest revealed bilateral perihilar infiltrates/edema and right >> left pleural effusion. IR consulted for repeat thoracentesis and cardiology consulted for assistance with diuresis. Covid-19 screening PCR was positive. CRP 3.2, PCT <0.10, d-dimer 5.66. Remdesivir, decadron, IV lasix were started. Pulmonary consulted for consideration of chest tube. Palliative also consulted to navigate goals of care decisions as the patient has been followed by hospice PTA. Family has decided against any thoracentesis or chest tube. Palliative to follow up Monday, though diuresis and covid management is ongoing with continued significant hypoxia.  Assessment & Plan: Active Problems:   Acute CHF (congestive heart failure) (HCC)  Acute on chronic hypoxic respiratory failure: Multifactorial.  - Treat etiologies listed below - Continue supplemental oxygen to maintain normal work of breathing and SpO2 >88%. Not making much headway despite treating etiologies as below. Suspect pleural effusions and associated atelectasis limiting effective pulmonary parenchyma which will not likely improve without fluid drainage. CTA chest personally reviewed from 4/30 demonstrating persistent bilateral effusions with slight shift of fluid more to the left. No PE.  Recurrent right > left pleural effusion: D/w IR who reports loculations on U/S during last attempt, suspects chest tube may be  necessary. PCT negative.  - PCCM consulted. Plan to Tx CHF and covid. Case discussed at length with IR and PCCM. The complexity of the fluid collection suggests low utility of thoracentesis, that chest tube would be required for definitive management. Family does not feel this is within the patient's goals of care/wishes and declines after deliberation.   Acute on chronic HFrEF: Echo Aug 2021 w/LVEF 30-35%, mod AS.  - Cardiology consulted. Continue lasix 60mg  IV BID. BUN:Cr widening (though steroids also on board). - Monitor I/O, daily weights, metabolic panel.  - With soft BPs, holding entresto   Covid-19 pneumonia: - Isolation x10 days. - Completed remdesivir x5 days (4/27 - 5/1)   - Continue decadron, will augment to solumedrol 40mg  IV q8h (TDD ~2mg /kg). Lymphopenia (100) continues and CRP remains persistently elevated. - Will remain on isolation  PAF with RVR:  - Per cardiology, continue metoprolol, consider DCing amiodarone as pt has not maintained rhythm control. - Avoiding anticoagulation with high fall risk. May consider anticoagulation if it would help dyspnea if due to PE.  Stage IIIa CKD: Putative Dx based on available CrCl.  - Avoid nephrotoxins if able.  CAD, HLD: +Moderate calcifications on CTA chest from March. No chest pain, cardiology does not suspect ACS. - Beta blocker   - Holding ASA  - Continue high-intensity statin  Parkinson's disease, dementia, depression, very HOH:  - Continue sinemet - Continue aricept, namenda, remeron, depakote.   Fall at home: No lacerations requiring repair in ED, negative trauma C spine/CT head.  - PT, OT. Continues to require SNF. [place] discussions to continue to inform disposition venue.  Elevated TSH: 9.763, though free T4 wnl.  - No further work up currently planned.  DVT prophylaxis: SCDs Code Status: DNR Family Communication: None at  bedside. Palliative and PCCM discussing care with Son daily Disposition Plan:  Status  is: Inpatient  Remains inpatient appropriate because:Inpatient level of care appropriate due to severity of illness   Dispo: The patient is from: SNF              Anticipated d/c is to: SNF              Patient currently is not medically stable to d/c.   Difficult to place patient No  Consultants:   PCCM  Cardiology  Palliative care medicine team  IR  Procedures:   None  Antimicrobials:  Remdesivir 4/27 - 5/1   Subjective: Breathing is no better. No new chest pain or leg swelling. Eating well.   Objective: Vitals:   01/17/21 2201 01/18/21 0000 01/18/21 0434 01/18/21 0804  BP:   99/66 116/76  Pulse: 86 79 83 80  Resp: (!) 22  20 13   Temp:   97.7 F (36.5 C) (!) 97.3 F (36.3 C)  TempSrc:   Oral Oral  SpO2: 91% 91% 98% 91%  Weight:        Intake/Output Summary (Last 24 hours) at 01/18/2021 0919 Last data filed at 01/17/2021 2330 Gross per 24 hour  Intake 840 ml  Output 1200 ml  Net -360 ml   Filed Weights   01/14/21 0158 01/14/21 0903 01/17/21 0900  Weight: 65.1 kg 62.4 kg 64.8 kg   Gen: Elderly male in no distress Pulm: Nonlabored breathing 7LPM HFNC, SpO2 89% at rest. Diminished bilaterally. CV: Irreg. No murmur, rub, or gallop. No JVD, no dependent edema. GI: Abdomen soft, non-tender, non-distended, with normoactive bowel sounds.  Ext: Warm, no deformities Skin: No new rashes, lesions or ulcers on visualized skin. Neuro: Alert and incompletely oriented without focal neurological deficits. Very HOH. Psych: Judgement and insight appear marginal. Mood euthymic & affect congruent. Behavior is appropriate.    Data Reviewed: I have personally reviewed following labs and imaging studies  CBC: Recent Labs  Lab 01/13/21 2113 01/14/21 0512 01/15/21 0631 01/16/21 0439 01/17/21 0429 01/18/21 0425  WBC 6.8 5.0 5.1 5.9 7.8 7.5  NEUTROABS 6.1  --  4.3 5.5 7.2 7.1  HGB 11.2* 10.1* 10.7* 10.5* 10.9* 11.1*  HCT 36.1* 32.5* 35.3* 34.3* 35.5* 35.0*  MCV 97.3  96.4 98.1 96.9 97.0 94.3  PLT 220 192 209 218 207 [id number]   Basic Metabolic Panel: Recent Labs  Lab 01/13/21 2116 01/14/21 0512 01/15/21 0631 01/16/21 0439 01/17/21 0429 01/18/21 0425  NA  --  140 142 141 138 137  K  --  3.7 3.7 3.8 4.1 4.0  CL  --  101 99 101 96* 96*  CO2  --  29 31 30 30 30   GLUCOSE  --  104* 96 129* 119* 134*  BUN  --  42* 35* 36* 44* 49*  CREATININE  --  1.43* 1.41* 1.16 1.23 1.20  CALCIUM  --  8.0* 8.0* 7.9* 8.0* 7.6*  MG 2.6*  --   --   --   --   --    GFR: Estimated Creatinine Clearance: 34.5 mL/min (by C-G formula based on SCr of 1.2 mg/dL). Liver Function Tests: Recent Labs  Lab 01/13/21 2113 01/15/21 0631 01/16/21 0439 01/17/21 0429 01/18/21 0425  AST 26 20 14* 16 23  ALT 6 9 12 6 6   ALKPHOS 77 64 54 54 54  BILITOT 0.7 0.7 0.7 0.8 0.8  PROT 7.2 6.3* 6.4* 6.4* 5.9*  ALBUMIN 3.1* 2.5* 2.6* 2.5*  2.4*   Thyroid Function Tests: No results for input(s): TSH, T4TOTAL, FREET4, T3FREE, THYROIDAB in the last 72 hours. Recent Results (from the past 240 hour(s))  Resp Panel by RT-PCR (Flu A&B, Covid) Nasopharyngeal Swab     Status: Abnormal   Collection Time: 01/14/21 12:13 AM   Specimen: Nasopharyngeal Swab; Nasopharyngeal(NP) swabs in vial transport medium  Result Value Ref Range Status   SARS Coronavirus 2 by RT PCR POSITIVE (A) NEGATIVE Final    Comment: RESULT CALLED TO, READ BACK BY AND VERIFIED WITH: AMBER BIESECKER RN 0127 01/14/21 HNM (NOTE) SARS-CoV-2 target nucleic acids are DETECTED.  The SARS-CoV-2 RNA is generally detectable in upper respiratory specimens during the acute phase of infection. Positive results are indicative of the presence of the identified virus, but do not rule out bacterial infection or co-infection with other pathogens not detected by the test. Clinical correlation with patient history and other diagnostic information is necessary to determine patient infection status. The expected result is Negative.  Fact Sheet  for Patients: EntrepreneurPulse.com.au  Fact Sheet for Healthcare Providers: IncredibleEmployment.be  This test is not yet approved or cleared by the [place] FDA and  has been authorized for detection and/or diagnosis of SARS-CoV-2 by FDA under an Emergency Use Authorization (EUA).  This EUA will remain in effect (meaning this test can  be used) for the duration of  the COVID-19 declaration under Section 564(b)(1) of the Act, 21 U.S.C. section 360bbb-3(b)(1), unless the authorization is terminated or revoked sooner.     Influenza A by PCR NEGATIVE NEGATIVE Final   Influenza B by PCR NEGATIVE NEGATIVE Final    Comment: (NOTE) The Xpert Xpress SARS-CoV-2/FLU/RSV plus assay is intended as an aid in the diagnosis of influenza from Nasopharyngeal swab specimens and should not be used as a sole basis for treatment. Nasal washings and aspirates are unacceptable for Xpert Xpress SARS-CoV-2/FLU/RSV testing.  Fact Sheet for Patients: EntrepreneurPulse.com.au  Fact Sheet for Healthcare Providers: IncredibleEmployment.be  This test is not yet approved or cleared by the [place] FDA and has been authorized for detection and/or diagnosis of SARS-CoV-2 by FDA under an Emergency Use Authorization (EUA). This EUA will remain in effect (meaning this test can be used) for the duration of the COVID-19 declaration under Section 564(b)(1) of the Act, 21 U.S.C. section 360bbb-3(b)(1), unless the authorization is terminated or revoked.  Performed at [facility], [address] [place], [place] [address]       Radiology Studies: CT ANGIO CHEST PE W OR WO CONTRAST  Result Date: 01/17/2021 CLINICAL DATA:  Shortness of breath EXAM: CT ANGIOGRAPHY CHEST WITH CONTRAST TECHNIQUE: Multidetector CT imaging of the chest was performed using the standard protocol during bolus administration of intravenous contrast.  Multiplanar CT image reconstructions and MIPs were obtained to evaluate the vascular anatomy. CONTRAST:  83mL OMNIPAQUE IOHEXOL 350 MG/ML SOLN COMPARISON:  12/15/2020 FINDINGS: Cardiovascular: Satisfactory opacification of the pulmonary arteries to the segmental level. No evidence of pulmonary embolism. Stable heart size. Left chest wall dual lead pacemaker. No pericardial effusion. Coronary artery calcification. Mediastinum/Nodes: No enlarged lymph nodes. Esophagus is unremarkable. Lungs/Pleura: Decreased partially loculated moderate to large right pleural effusion. Increased moderate effusion. Persistent near complete atelectasis right middle and lower lobes. Partial right upper lobe atelectasis. Partial left lower lobe atelectasis. Upper Abdomen: No acute abnormality. Musculoskeletal: No acute osseous abnormality. Review of the MIP images confirms the above findings. IMPRESSION: No evidence of acute pulmonary embolism. Decreased moderate to large right pleural effusion  with improved right lung aeration. Persistent near total atelectasis of the right lower and middle lobes. Increased moderate left pleural effusion with partial left lower lobe atelectasis. Electronically Signed   By: Macy Mis M.D.   On: 01/17/2021 11:24    Scheduled Meds: . ascorbic acid  500 mg Oral Daily  . atorvastatin  80 mg Oral QPM  . bacitracin   Topical BID  . carbidopa-levodopa  1 tablet Oral TID  . dexamethasone (DECADRON) injection  4 mg Intravenous Q12H  . divalproex  250 mg Oral TID  . donepezil  10 mg Oral QHS  . enoxaparin (LOVENOX) injection  30 mg Subcutaneous Q24H  . famotidine  20 mg Oral BID  . furosemide  60 mg Intravenous BID  . memantine  10 mg Oral BID  . metoprolol succinate  12.5 mg Oral Daily  . mirtazapine  15 mg Oral QHS   Continuous Infusions: . remdesivir 100 mg in NS 100 mL 100 mg (01/18/21 0910)     LOS: 5 days   Time spent: 35 minutes.  Patrecia Pour, MD [facility] [email or web address] 01/18/2021, 9:19 AM

## 2021-01-18 NOTE — Progress Notes (Signed)
Pulmonary Medicine          Date: 01/18/2021,   MRN# [id number] Eric Hendricks 1924-07-14     AdmissionWeight: 65.1 kg                 CurrentWeight: 62.3 kg   Referring physician: Dr Bonner Puna   CHIEF COMPLAINT:   Acute on chronic hypoxemic respiratory failure with recurrent pleural effusion   HISTORY OF PRESENT ILLNESS   As per admission h/p this is a74 y.o. Caucasian male with medical history significant for systolic CHF, atrial fibrillation, Parkinson's disease, dyslipidemia, dementia and depression, who presented to emergency room after having an accidental fall with subsequent left forehead abrasion.  The patient apparently rolled from his bed while asleep and immediately woke up.  He denied any presyncope or syncope.  No chest pain or palpitations.  He had a small abrasion to the left shoulder as well.  No paresthesias or focal muscle weakness.  No dysuria, oliguria or hematuria or flank pain reported.  Is a fairly poor historian due to his dementia.  Per the patient's son he has apparently been followed by hospice at his SNF.   When he came to the ER heart rate was 115 with possibility of her percent on 4 L of O2 nasal cannula as his pulse oximetry was 88% on his baseline 3 L.  Vital signs were otherwise normal.  CMP was remarkable for a BUN of 44 and creatinine 1.57 compared to 36 and 1.42 on 12/18/2020.  BNP was 582.  CBC showed mild anemia close to baseline.  Respiratory panel is currently pending.  Head and C-spine CT showed no acute intracranial abnormalities and chronic small vessel ischemic disease with chronic atrophy and normal alignment of the C-spine with degenerative changes as well as bilateral pleural effusions CTA revealed large right pleural effusion with subtotal collapse of the right lung and small left pleural effusion, moderate coronary artery calcification, aortic atherosclerosis with no PE.  PCCM consultation placed for loculated right pleural effusion.  I  discussed case with Attending physician over phone after my evaluation of imaging and labs.  Patient is calm non tachypneic.  He diuresed well, he is COVID+ and had negative test last month so this seems to be new case.  He is on 3L [place] and with sPO2>95%  01/16/21- patient resting in bed in no distress. His BP is mildy low today.  I discussed with IR regarding thora/chest tube it seems that multiloculated effusion is precluding thoracentesis so we agreed on chest tube placement and IR team is in communication with POA son [name].   01/17/21- patient is unchanged from previous. Family has reported they do not wish to have any procedure.   01/18/21- Patient is stable resting in bed in no distress, he is alone no visitors. He is with hearing loss bilaterally.  Family is working with palliative on goals of care and currently conservative management is elected by family. Pt is DNR  PAST MEDICAL HISTORY   Past Medical History:  Diagnosis Date  . Arrhythmia    atrial fibrillation  . B12 deficiency   . CHF (congestive heart failure) ([place])   . Dementia ([place])   . Depression   . Hyperlipemia   . Parkinson's disease ([place])   . Prostate cancer [facility])      SURGICAL HISTORY   Past Surgical History:  Procedure Laterality Date  . ANKLE SURGERY Right   . APPENDECTOMY    . CATARACT EXTRACTION Bilateral   .  TRANSURETHRAL RESECTION OF PROSTATE       FAMILY HISTORY   Family History  Problem Relation Age of Onset  . Colon cancer Father   . Tremor Mother   . Parkinson's disease Brother   . Prostate cancer Brother   . Lung cancer Sister   . Emphysema Sister   . Heart failure Child   . Diabetes Child   . AAA (abdominal aortic aneurysm) Child      SOCIAL HISTORY   Social History   Tobacco Use  . Smoking status: Never Smoker  . Smokeless tobacco: Never Used  Vaping Use  . Vaping Use: Never used  Substance Use Topics  . Alcohol use: No    Alcohol/week: 0.0 standard drinks  . Drug use: No      MEDICATIONS    Home Medication:    Current Medication:  Current Facility-Administered Medications:  .  acetaminophen (TYLENOL) tablet 650 mg, 650 mg, Oral, Q6H PRN **OR** acetaminophen (TYLENOL) suppository 650 mg, 650 mg, Rectal, Q6H PRN, Mansy, Jan A, MD .  ascorbic acid (VITAMIN C) tablet 500 mg, 500 mg, Oral, Daily, Mansy, Jan A, MD, 500 mg at 01/18/21 0911 .  atorvastatin (LIPITOR) tablet 80 mg, 80 mg, Oral, QPM, Mansy, Jan A, MD, 80 mg at 01/17/21 1731 .  bacitracin ointment, , Topical, BID, Patrecia Pour, MD, Given at 01/18/21 [phone]  carbidopa-levodopa (SINEMET IR) 25-100 MG per tablet immediate release 1 tablet, 1 tablet, Oral, TID, Mansy, Jan A, MD, 1 tablet at 01/18/21 1247 .  divalproex (DEPAKOTE) DR tablet 250 mg, 250 mg, Oral, TID, Mansy, Jan A, MD, 250 mg at 01/18/21 0912 .  docusate sodium (COLACE) capsule 100 mg, 100 mg, Oral, BID PRN, Mansy, Jan A, MD .  donepezil (ARICEPT) tablet 10 mg, 10 mg, Oral, QHS, Mansy, Jan A, MD, 10 mg at 01/17/21 2153 .  [START ON 01/19/2021] enoxaparin (LOVENOX) injection 40 mg, 40 mg, Subcutaneous, Q24H, Benita Gutter, RPH .  famotidine (PEPCID) tablet 20 mg, 20 mg, Oral, BID, Mansy, Jan A, MD, 20 mg at 01/18/21 0911 .  furosemide (LASIX) injection 60 mg, 60 mg, Intravenous, BID, Mansy, Jan A, MD, 60 mg at 01/18/21 0912 .  magnesium hydroxide (MILK OF MAGNESIA) suspension 30 mL, 30 mL, Oral, Daily PRN, Mansy, Jan A, MD .  [facility]) tablet 10 mg, 10 mg, Oral, BID, Mansy, Jan A, MD, 10 mg at 01/18/21 0911 .  methylPREDNISolone sodium succinate (SOLU-MEDROL) 40 mg/mL injection 40 mg, 40 mg, Intravenous, Q8H, Vance Gather B, MD, 40 mg at 01/18/21 1247 .  metoprolol succinate (TOPROL-XL) 24 hr tablet 12.5 mg, 12.5 mg, Oral, Daily, Mansy, Jan A, MD, 12.5 mg at 01/18/21 0911 .  mirtazapine (REMERON) tablet 15 mg, 15 mg, Oral, QHS, Mansy, Jan A, MD, 15 mg at 01/17/21 2153 .  ondansetron (ZOFRAN) tablet 4 mg, 4 mg, Oral, Q6H PRN **OR**  ondansetron (ZOFRAN) injection 4 mg, 4 mg, Intravenous, Q6H PRN, Mansy, Jan A, MD .  polyvinyl alcohol (LIQUIFILM TEARS) 1.4 % ophthalmic solution 2 drop, 2 drop, Both Eyes, Q4H PRN, Mansy, Jan A, MD .  traZODone (DESYREL) tablet 25 mg, 25 mg, Oral, QHS PRN, Mansy, Jan A, MD, 25 mg at 01/17/21 2153    ALLERGIES   Phenylephrine-guaifenesin, Aspirin, Guaifenesin, Guanfacine, Phenylephrine, and Suprax [cefixime]     REVIEW OF SYSTEMS    Review of Systems:  Gen:  Denies  fever, sweats, chills weigh loss  HEENT: Denies blurred vision, double vision, ear  pain, eye pain, hearing loss, nose bleeds, sore throat Cardiac:  No dizziness, chest pain or heaviness, chest tightness,edema Resp:   Denies cough or sputum porduction, shortness of breath,wheezing, hemoptysis,  Gi: Denies swallowing difficulty, stomach pain, nausea or vomiting, diarrhea, constipation, bowel incontinence Gu:  Denies bladder incontinence, burning urine Ext:   Denies Joint pain, stiffness or swelling Skin: Denies  skin rash, easy bruising or bleeding or hives Endoc:  Denies polyuria, polydipsia , polyphagia or weight change Psych:   Denies depression, insomnia or hallucinations   Other:  All other systems negative   VS: BP 111/67 (BP Location: Right Arm)   Pulse 69   Temp (!) 97.5 F (36.4 C) (Axillary)   Resp 18   Wt 62.3 kg   SpO2 92%   BMI 20.88 kg/m      PHYSICAL EXAM    GENERAL:NAD, no fevers, chills, no weakness no fatigue HEAD: Normocephalic, atraumatic.  EYES: Pupils equal, round, reactive to light. Extraocular muscles intact. No scleral icterus.  MOUTH: Moist mucosal membrane. Dentition intact. No abscess noted.  EAR, NOSE, THROAT: Clear without exudates. No external lesions.  NECK: Supple. No thyromegaly. No nodules. No JVD.  PULMONARY: decreased air entry worse on right CARDIOVASCULAR: S1 and S2. Regular rate and rhythm. No murmurs, rubs, or gallops. No edema. Pedal pulses 2+ bilaterally.   GASTROINTESTINAL: Soft, nontender, nondistended. No masses. Positive bowel sounds. No hepatosplenomegaly.  MUSCULOSKELETAL: No swelling, clubbing, or edema. Range of motion full in all extremities.  NEUROLOGIC: Cranial nerves II through XII are intact. No gross focal neurological deficits. Sensation intact. Reflexes intact.  SKIN: No ulceration, lesions, rashes, or cyanosis. Skin warm and dry. Turgor intact.  PSYCHIATRIC: Mood, affect within normal limits. The patient is awake, alert and oriented x 3. Insight, judgment intact.       IMAGING    CT Head Wo Contrast  Result Date: 01/13/2021 CLINICAL DATA:  [place] with bruises to the forehead.  Neck trauma. EXAM: CT HEAD WITHOUT CONTRAST CT CERVICAL SPINE WITHOUT CONTRAST TECHNIQUE: Multidetector CT imaging of the head and cervical spine was performed following the standard protocol without intravenous contrast. Multiplanar CT image reconstructions of the cervical spine were also generated. COMPARISON:  CT head and cervical spine 07/31/2018 FINDINGS: CT HEAD FINDINGS Brain: No evidence of acute infarction, hemorrhage, hydrocephalus, extra-axial collection or mass lesion/mass effect. Diffuse cerebral atrophy. Ventricular dilatation likely due to central atrophy. Patchy low-attenuation changes in the deep white matter consistent small vessel ischemia. Vascular: Moderate intracranial arterial vascular calcifications. Skull: Calvarium appears intact. Sinuses/Orbits: Paranasal sinuses and mastoid air cells are clear. Other: None. CT CERVICAL SPINE FINDINGS Alignment: Normal alignment Skull base and vertebrae: No acute fracture. No primary bone lesion or focal pathologic process. Soft tissues and spinal canal: No prevertebral fluid or swelling. No visible canal hematoma. Disc levels: Degenerative changes with narrowed interspaces and endplate hypertrophic change most prominent at C5-6 and C6-7 levels. Prominent ligamentous calcifications. Degenerative changes in  the facet joints. Upper chest: Bilateral pleural effusions are demonstrated in the apices mid Other: Spur calcifications IMPRESSION: 1. No acute intracranial abnormalities. Chronic atrophy and small vessel ischemic changes. 2. Normal alignment of the cervical spine. Degenerative changes. No acute displaced fractures identified. 3. Bilateral pleural effusions. Electronically Signed   By: Lucienne Capers M.D.   On: 01/13/2021 22:06   CT ANGIO CHEST PE W OR WO CONTRAST  Result Date: 01/17/2021 CLINICAL DATA:  Shortness of breath EXAM: CT ANGIOGRAPHY CHEST WITH CONTRAST TECHNIQUE: Multidetector CT imaging of  the chest was performed using the standard protocol during bolus administration of intravenous contrast. Multiplanar CT image reconstructions and MIPs were obtained to evaluate the vascular anatomy. CONTRAST:  50mL OMNIPAQUE IOHEXOL 350 MG/ML SOLN COMPARISON:  12/15/2020 FINDINGS: Cardiovascular: Satisfactory opacification of the pulmonary arteries to the segmental level. No evidence of pulmonary embolism. Stable heart size. Left chest wall dual lead pacemaker. No pericardial effusion. Coronary artery calcification. Mediastinum/Nodes: No enlarged lymph nodes. Esophagus is unremarkable. Lungs/Pleura: Decreased partially loculated moderate to large right pleural effusion. Increased moderate effusion. Persistent near complete atelectasis right middle and lower lobes. Partial right upper lobe atelectasis. Partial left lower lobe atelectasis. Upper Abdomen: No acute abnormality. Musculoskeletal: No acute osseous abnormality. Review of the MIP images confirms the above findings. IMPRESSION: No evidence of acute pulmonary embolism. Decreased moderate to large right pleural effusion with improved right lung aeration. Persistent near total atelectasis of the right lower and middle lobes. Increased moderate left pleural effusion with partial left lower lobe atelectasis. Electronically Signed   By: Macy Mis M.D.    On: 01/17/2021 11:24   CT Cervical Spine Wo Contrast  Result Date: 01/13/2021 CLINICAL DATA:  [place] with bruises to the forehead.  Neck trauma. EXAM: CT HEAD WITHOUT CONTRAST CT CERVICAL SPINE WITHOUT CONTRAST TECHNIQUE: Multidetector CT imaging of the head and cervical spine was performed following the standard protocol without intravenous contrast. Multiplanar CT image reconstructions of the cervical spine were also generated. COMPARISON:  CT head and cervical spine 07/31/2018 FINDINGS: CT HEAD FINDINGS Brain: No evidence of acute infarction, hemorrhage, hydrocephalus, extra-axial collection or mass lesion/mass effect. Diffuse cerebral atrophy. Ventricular dilatation likely due to central atrophy. Patchy low-attenuation changes in the deep white matter consistent small vessel ischemia. Vascular: Moderate intracranial arterial vascular calcifications. Skull: Calvarium appears intact. Sinuses/Orbits: Paranasal sinuses and mastoid air cells are clear. Other: None. CT CERVICAL SPINE FINDINGS Alignment: Normal alignment Skull base and vertebrae: No acute fracture. No primary bone lesion or focal pathologic process. Soft tissues and spinal canal: No prevertebral fluid or swelling. No visible canal hematoma. Disc levels: Degenerative changes with narrowed interspaces and endplate hypertrophic change most prominent at C5-6 and C6-7 levels. Prominent ligamentous calcifications. Degenerative changes in the facet joints. Upper chest: Bilateral pleural effusions are demonstrated in the apices mid Other: Spur calcifications IMPRESSION: 1. No acute intracranial abnormalities. Chronic atrophy and small vessel ischemic changes. 2. Normal alignment of the cervical spine. Degenerative changes. No acute displaced fractures identified. 3. Bilateral pleural effusions. Electronically Signed   By: Lucienne Capers M.D.   On: 01/13/2021 22:06   DG Chest Port 1 View  Result Date: 01/15/2021 CLINICAL DATA:  Hypoxic respiratory  failure EXAM: PORTABLE CHEST 1 VIEW COMPARISON:  01/13/2021 FINDINGS: Large, partially loculated right pleural effusion is again identified, stable since prior examination with associated right basilar compressive atelectasis. Lung volumes are small. Superimposed perihilar interstitial edema, best appreciated within the left lung, appears slightly improved. No pneumothorax. No pleural effusion on the left. Left subclavian dual lead pacemaker is unchanged. Cardiac size within normal limits. No acute bone abnormality. IMPRESSION: Stable large right partially loculated pleural effusion. Improving superimposed mild interstitial pulmonary edema Electronically Signed   By: Fidela Salisbury MD   On: 01/15/2021 01:46   DG Chest Port 1 View  Result Date: 01/13/2021 CLINICAL DATA:  Cough and shortness of breath.  Weakness and fall. EXAM: PORTABLE CHEST 1 VIEW COMPARISON:  12/19/2020 FINDINGS: Cardiac pacemaker. Shallow inspiration. Heart size is obscured but appears enlarged. Bilateral perihilar infiltration/edema.  Moderate right pleural effusion. Progression of changes since previous study. Calcification of the aorta. IMPRESSION: Progression of bilateral perihilar infiltration/edema and right pleural effusion. Electronically Signed   By: Lucienne Capers M.D.   On: 01/13/2021 21:28      ASSESSMENT/PLAN   Acute on chronic hypoxemic respiratory failure Acute COVID19 pneumonia -Remdesevir antiviral - pharmacy protocol 5 d -vitamin C -zinc -jIV steroids  -Diuresis - Lasix 60 IV - monitor UOP - utilize external urinary catheter if possible -No need to  Self prone -encourage to use IS and Acapella device for bronchopulmonary hygiene when able -d/c hepatotoxic medications while on remdesevir -supportive care  monitoring -PT/OT when possible -procalcitonin, CRP, ddimer and ferritin trending   Acute on chronic HFrEF 30-35%  - cardiology on case appreciate input  - continuing diuresis - 1200cc uop  overnight -he is malnourished and has low albumin , will add once daily IV albumin while on lasix to potentiate diuresis  -continue statins -PT/OT - high fall risk - no anticoagulation    Right pleural effusion   - no signs of bacterial pneumonia and low suspicion for para-pneumonic effusion at this time due to absence of leukocytosis, fevers, bilateral infiltrates in context of viral pneumonia induced CHF exacerbation -family wishes for conservative mgt only at this time    Thank you for allowing me to participate in the care of this patient.     Patient/Family are satisfied with care plan and all questions have been answered.  This document was prepared using Dragon voice recognition software and may include unintentional dictation errors.     Ottie Glazier, M.D.  Division of [place]

## 2021-01-18 NOTE — Progress Notes (Signed)
PHARMACIST - PHYSICIAN COMMUNICATION  CONCERNING:  Enoxaparin (Lovenox) for DVT Prophylaxis    RECOMMENDATION: Patient was prescribed enoxaparin 30mg  q24 hours for VTE prophylaxis.   Filed Weights   01/14/21 0158 01/14/21 0903 01/17/21 0900  Weight: 65.1 kg (143 lb 8.3 oz) 62.4 kg (137 lb 9.1 oz) 64.8 kg (142 lb 12.8 oz)    Body mass index is 21.71 kg/m.  Estimated Creatinine Clearance: 34.5 mL/min (by C-G formula based on SCr of 1.2 mg/dL).  Patient is candidate for enoxaparin 40mg  every 24 hours based on CrCl >23ml/min and Weight >45kg  DESCRIPTION: Pharmacy has adjusted enoxaparin dose per [facility] policy.  Patient is now receiving enoxaparin 40 mg every 24 hours   Benita Gutter 01/18/2021 10:52 AM

## 2021-01-19 DIAGNOSIS — I509 Heart failure, unspecified: Secondary | ICD-10-CM | POA: Diagnosis not present

## 2021-01-19 DIAGNOSIS — I5021 Acute systolic (congestive) heart failure: Secondary | ICD-10-CM | POA: Diagnosis not present

## 2021-01-19 DIAGNOSIS — U071 COVID-19: Secondary | ICD-10-CM | POA: Diagnosis not present

## 2021-01-19 DIAGNOSIS — J9621 Acute and chronic respiratory failure with hypoxia: Secondary | ICD-10-CM | POA: Diagnosis not present

## 2021-01-19 LAB — CBC WITH DIFFERENTIAL/PLATELET
Abs Immature Granulocytes: 0.04 10*3/uL (ref 0.00–0.07)
Basophils Absolute: 0 10*3/uL (ref 0.0–0.1)
Basophils Relative: 0 %
Eosinophils Absolute: 0 10*3/uL (ref 0.0–0.5)
Eosinophils Relative: 0 %
HCT: 36.2 % — ABNORMAL LOW (ref 39.0–52.0)
Hemoglobin: 11.4 g/dL — ABNORMAL LOW (ref 13.0–17.0)
Immature Granulocytes: 1 %
Lymphocytes Relative: 2 %
Lymphs Abs: 0.1 10*3/uL — ABNORMAL LOW (ref 0.7–4.0)
MCH: 29.5 pg (ref 26.0–34.0)
MCHC: 31.5 g/dL (ref 30.0–36.0)
MCV: 93.8 fL (ref 80.0–100.0)
Monocytes Absolute: 0.3 10*3/uL (ref 0.1–1.0)
Monocytes Relative: 4 %
Neutro Abs: 7.3 10*3/uL (ref 1.7–7.7)
Neutrophils Relative %: 93 %
Platelets: 201 10*3/uL (ref 150–400)
RBC: 3.86 MIL/uL — ABNORMAL LOW (ref 4.22–5.81)
RDW: 17.8 % — ABNORMAL HIGH (ref 11.5–15.5)
WBC: 7.8 10*3/uL (ref 4.0–10.5)
nRBC: 0 % (ref 0.0–0.2)

## 2021-01-19 LAB — COMPREHENSIVE METABOLIC PANEL
ALT: 10 U/L (ref 0–44)
AST: 23 U/L (ref 15–41)
Albumin: 2.5 g/dL — ABNORMAL LOW (ref 3.5–5.0)
Alkaline Phosphatase: 53 U/L (ref 38–126)
Anion gap: 12 (ref 5–15)
BUN: 56 mg/dL — ABNORMAL HIGH (ref 8–23)
CO2: 32 mmol/L (ref 22–32)
Calcium: 7.8 mg/dL — ABNORMAL LOW (ref 8.9–10.3)
Chloride: 95 mmol/L — ABNORMAL LOW (ref 98–111)
Creatinine, Ser: 1.34 mg/dL — ABNORMAL HIGH (ref 0.61–1.24)
GFR, Estimated: 49 mL/min — ABNORMAL LOW (ref >60)
Glucose, Bld: 124 mg/dL — ABNORMAL HIGH (ref 70–99)
Potassium: 4.1 mmol/L (ref 3.5–5.1)
Sodium: 139 mmol/L (ref 135–145)
Total Bilirubin: 0.7 mg/dL (ref 0.3–1.2)
Total Protein: 6.2 g/dL — ABNORMAL LOW (ref 6.5–8.1)

## 2021-01-19 LAB — C-REACTIVE PROTEIN: CRP: 1.7 mg/dL — ABNORMAL HIGH (ref <1.0)

## 2021-01-19 LAB — D-DIMER, QUANTITATIVE: D-Dimer, Quant: 3.28 ug/mL-FEU — ABNORMAL HIGH (ref 0.00–0.50)

## 2021-01-19 MED ORDER — FUROSEMIDE 40 MG PO TABS
40.0000 mg | ORAL_TABLET | Freq: Two times a day (BID) | ORAL | Status: DC
  Administered 2021-01-19 – 2021-01-21 (×4): 40 mg via ORAL
  Filled 2021-01-19 (×4): qty 1

## 2021-01-19 MED ORDER — LORAZEPAM 2 MG/ML IJ SOLN: 1.0000 mg | INTRAMUSCULAR | Status: DC | PRN

## 2021-01-19 MED ORDER — MORPHINE SULFATE (CONCENTRATE) 10 MG/0.5ML PO SOLN: 5.0000 mg | ORAL | Status: DC | PRN

## 2021-01-19 MED ORDER — GLYCOPYRROLATE 0.2 MG/ML IJ SOLN
0.3000 mg | INTRAMUSCULAR | Status: DC | PRN
  Filled 2021-01-19: qty 1.5

## 2021-01-19 MED ORDER — BENZONATATE 100 MG PO CAPS
100.0000 mg | ORAL_CAPSULE | Freq: Three times a day (TID) | ORAL | Status: DC | PRN
  Administered 2021-01-19 – 2021-01-20 (×2): 100 mg via ORAL
  Filled 2021-01-19 (×2): qty 1

## 2021-01-19 MED ORDER — BIOTENE DRY MOUTH MT LIQD: 15.0000 mL | OROMUCOSAL | Status: DC | PRN

## 2021-01-19 MED ORDER — LORAZEPAM 1 MG PO TABS
1.0000 mg | ORAL_TABLET | ORAL | Status: DC | PRN
Start: 2021-01-19 — End: 2021-01-21

## 2021-01-19 MED ORDER — POLYVINYL ALCOHOL 1.4 % OP SOLN: 1.0000 [drp] | Freq: Four times a day (QID) | OPHTHALMIC | Status: DC | PRN

## 2021-01-19 NOTE — Progress Notes (Signed)
Daily Progress Note   Patient Name: Eric Hendricks       Date: 01/19/2021 DOB: Nov 04, 1923  Age: 87 y.o. MRN#: [id number] Attending Physician: Patrecia Pour, MD Primary Care Physician: [name], MD Admit Date: 01/13/2021  Reason for Consultation/Follow-up: Establishing goals of care  Subjective: Chart Reviewed. Updates Received. Patient Assessed.   Patient hard of hearing. Denies pain. Continues to have significant shortness of breath with minimal exertion.   No family at the bedside.   Spoke with patient's son, Eric Hendricks at length. Updates provided. He verbalized understanding and appreciation of continued support. Son shares he and his sister had long discussion over the weekend and decided the would not like to proceed with chest tube placement. He is tearful expressing his father's wishes and recent decline in quality of life. Family feels patient would not tolerate or be interested in chest tube placement or continued medical interventions.   Eric Hendricks is tearful expressing his father is tired and has been for some time. He misses his wife who passed years ago and has made it clear when able that he would not wish for his life to be prolonged. Emotional support provided.   Son states patient was receiving hospice care prior to hospitalization and family wishes to resume their care at discharge.   We discussed patient's continued shortness of breath and concerns for discomfort.   Eric Hendricks is clear in expressed goals for all care to focus on patient's comfort with no further medical interventions.   Education provided on comfort care and symptom management while hospitalized and options for ongoing support at discharge.  Son verbalized understanding and would like to focus on patient's comfort while hospitalized and at discharge confirming plans to allow patient to spend what time he has left at peace and comfortable.   All questions answered and support provided.     Length of Stay:  6 days  Vital Signs: BP 113/63 (BP Location: Left Arm)   Pulse 73   Temp 98 F (36.7 C) (Oral)   Resp 20   Wt 62.3 kg   SpO2 91%   BMI 20.88 kg/m  SpO2: SpO2: 91 % O2 Device: O2 Device: High Flow Nasal Cannula O2 Flow Rate: O2 Flow Rate (L/min): 6 L/min  The above conversation was completed via telephone due to the visitor restrictions during the COVID-19 pandemic. Thorough chart review and discussion with necessary members of the care team was completed as part of assessment. All issues were discussed and addressed but no physical exam was performed.  Palliative Care Assessment & Plan    Code Status:  DNR  Goals of Care/Recommendations:  All care to focus on patient's comfort. Will continue Covid pneumonia treatments for symptom management. No escalation of care. No chest tube.   Roxanol as needed for shortness of breath/pain  Robinul as needed for secretions  Family request patient discharge back to facility with hospice in place.   PMT will continue to support and follow as needed.   Prognosis: < 6 months   Discharge Planning: [place] with Hospice  Thank you for allowing the Palliative Medicine Team to assist in the care of this patient.  Time Total: 50 min.   Visit consisted of counseling and education dealing with the complex and emotionally intense issues of symptom management and palliative care in the setting of serious and potentially life-threatening illness.Greater than 50%  of this time was spent counseling and coordinating care related to the above assessment and plan.  Alda Lea, AGPCNP-BC  Palliative Medicine Team [phone]

## 2021-01-19 NOTE — Progress Notes (Signed)
PROGRESS NOTE  Eric Hendricks  [id number] DOB: [birth year] DOA: 01/13/2021 PCP: [name], MD   Brief Narrative: Eric Hendricks is a 87 y.o. male with a history of 3L oxygen-dependent COPD, chronic HFrEF, Parkinson's disease, AFib, dementia, depression, HLD who presented to the ED after falling out of his bed at home with resultant left forehead abrasion. In the ED he was tachycardic with oxygen requirement above baseline at 4L O2. BNP 582. ECG with AFib with RVR. CT cervical spine and head without acute fracture, dislocation or hemorrhage. CTA chest revealed bilateral perihilar infiltrates/edema and right >> left pleural effusion. IR consulted for repeat thoracentesis and cardiology consulted for assistance with diuresis. Covid-19 screening PCR was positive. CRP 3.2, PCT <0.10, d-dimer 5.66. Remdesivir, decadron, IV lasix were started. Pulmonary consulted for consideration of chest tube. Palliative also consulted to navigate goals of care decisions as the patient has been followed by hospice PTA. Family has decided against any thoracentesis or chest tube. Palliative to follow up Monday, though diuresis and covid management is ongoing with continued significant hypoxia.  Assessment & Plan: Active Problems:   Acute CHF (congestive heart failure) (HCC)  Acute on chronic hypoxic respiratory failure: Multifactorial.  - Treat etiologies listed below - Continue supplemental oxygen to maintain normal work of breathing and SpO2 >88%. Not making much headway despite treating etiologies as below, still on 7L at rest with SpO2 at 90-91%. Suspect pleural effusions and associated atelectasis limiting effective pulmonary parenchyma which will not likely improve without fluid drainage. CTA chest personally reviewed from 4/30 demonstrating persistent bilateral effusions with slight shift of fluid more to the left. No PE.  Recurrent right > left pleural effusion: D/w IR who reports loculations on U/S during  last attempt, suspects chest tube may be necessary. PCT negative.  - PCCM consulted. Plan to Tx CHF and covid. Case discussed at length with IR and PCCM. The complexity of the fluid collection suggests low utility of thoracentesis, that chest tube would be required for definitive management. Family does not feel this is within the patient's goals of care/wishes and declines after deliberation.   Acute on chronic HFrEF: Echo Aug 2021 w/LVEF 30-35%, mod AS.  - Cardiology consulted. Will deescalate lasix to 40mg  po daily starting this evening given creatinine bump.   - Monitor I/O, daily weights, metabolic panel.  - With soft BPs, holding entresto   Covid-19 pneumonia: - Isolation x10 days. - Completed remdesivir x5 days (4/27 - 5/1)   - Continue solumedrol 40mg  IV q8h (TDD ~2mg /kg). Lymphopenia (100) continues and CRP remains persistently elevated. Will recheck and likely deescalate in the next day or two. - Will remain on isolation.  PAF with RVR:  - Continue metoprolol, consider DCing amiodarone as pt has not maintained rhythm control. Rate control is satisfactory.  - Avoiding anticoagulation with high fall risk. May consider anticoagulation if it would help dyspnea if due to PE.  Stage IIIa CKD: Putative Dx based on available CrCl.  - Avoid nephrotoxins if able. Creatinine bumped 5/2, will deescalate diuresis as above and monitor in AM.  CAD, HLD: +Moderate calcifications on CTA chest from March. No chest pain, cardiology does not suspect ACS. - Beta blocker   - Holding ASA  - Continue high-intensity statin  Parkinson's disease, dementia, depression, very HOH:  - Continue sinemet - Continue aricept, namenda, remeron, depakote.   Fall at home: No lacerations requiring repair in ED, negative trauma C spine/CT head.  - PT, OT. Continues to require SNF. [place]  discussions to continue to inform disposition venue.  Elevated TSH: 9.763, though free T4 wnl.  - No further work up  currently planned.  DVT prophylaxis: SCDs Code Status: DNR Family Communication: None at bedside. Palliative and PCCM discussing care with Son daily Disposition Plan:  Status is: Inpatient  Remains inpatient appropriate because:Inpatient level of care appropriate due to severity of illness   Dispo: The patient is from: SNF              Anticipated d/c is to: SNF              Patient currently is not medically stable to d/c.   Difficult to place patient No  Consultants:   PCCM  Cardiology  Palliative care medicine team  IR  Procedures:   None  Antimicrobials:  Remdesivir 4/27 - 5/1   Subjective: Very HOH, states he doesn't feel any different from yesterday as far as he knows. He appreciates [place] trying to help him. No chest pain. Hungry for breakfast. No pain. Is hard to breathe with movement in bed.  Objective: Vitals:   01/18/21 1600 01/18/21 2000 01/19/21 0400 01/19/21 0800  BP: 98/70 100/74 103/66 113/63  Pulse: 81 85 73 73  Resp: 20 20 20 20   Temp:  98.1 F (36.7 C) 97.9 F (36.6 C) 98 F (36.7 C)  TempSrc:  Oral Oral Oral  SpO2: 93% 97% 99% 91%  Weight:        Intake/Output Summary (Last 24 hours) at 01/19/2021 1003 Last data filed at 01/19/2021 5573 Gross per 24 hour  Intake 840 ml  Output 1700 ml  Net -860 ml   Filed Weights   01/14/21 0903 01/17/21 0900 01/18/21 0800  Weight: 62.4 kg 64.8 kg 62.3 kg   Gen: Elderly male in no distress Pulm: Nonlabored but tachypneic at rest with diminished crackles at bases. No wheezes. Some upper airway transmission.. CV: Irreg irreg. No murmur, rub, or gallop. No JVD, no pitting dependent edema. GI: Abdomen soft, non-tender, non-distended, with normoactive bowel sounds.  Ext: Warm, no deformities Skin: No new rashes, lesions or ulcers on visualized skin. Ecchymoses stable. Neuro: Alert and incompletely oriented. HOH but without  focal neurological deficits. Psych: Judgement and insight appear marginal. Mood  euthymic & affect congruent. Behavior is appropriate.    Data Reviewed: I have personally reviewed following labs and imaging studies  CBC: Recent Labs  Lab 01/15/21 0631 01/16/21 0439 01/17/21 0429 01/18/21 0425 01/19/21 0601  WBC 5.1 5.9 7.8 7.5 7.8  NEUTROABS 4.3 5.5 7.2 7.1 7.3  HGB 10.7* 10.5* 10.9* 11.1* 11.4*  HCT 35.3* 34.3* 35.5* 35.0* 36.2*  MCV 98.1 96.9 97.0 94.3 93.8  PLT 209 218 207 199 220   Basic Metabolic Panel: Recent Labs  Lab 01/13/21 2116 01/14/21 0512 01/15/21 0631 01/16/21 0439 01/17/21 0429 01/18/21 0425 01/19/21 0601  NA  --    < > 142 141 138 137 139  K  --    < > 3.7 3.8 4.1 4.0 4.1  CL  --    < > 99 101 96* 96* 95*  CO2  --    < > 31 30 30 30  32  GLUCOSE  --    < > 96 129* 119* 134* 124*  BUN  --    < > 35* 36* 44* 49* 56*  CREATININE  --    < > 1.41* 1.16 1.23 1.20 1.34*  CALCIUM  --    < > 8.0* 7.9* 8.0* 7.6*  7.8*  MG 2.6*  --   --   --   --   --   --    < > = values in this interval not displayed.   GFR: Estimated Creatinine Clearance: 29.7 mL/min (A) (by C-G formula based on SCr of 1.34 mg/dL (H)). Liver Function Tests: Recent Labs  Lab 01/15/21 0631 01/16/21 0439 01/17/21 0429 01/18/21 0425 01/19/21 0601  AST 20 14* 16 23 23   ALT 9 12 6 6 10   ALKPHOS 64 54 54 54 53  BILITOT 0.7 0.7 0.8 0.8 0.7  PROT 6.3* 6.4* 6.4* 5.9* 6.2*  ALBUMIN 2.5* 2.6* 2.5* 2.4* 2.5*   Thyroid Function Tests: No results for input(s): TSH, T4TOTAL, FREET4, T3FREE, THYROIDAB in the last 72 hours. Recent Results (from the past 240 hour(s))  Resp Panel by RT-PCR (Flu A&B, Covid) Nasopharyngeal Swab     Status: Abnormal   Collection Time: 01/14/21 12:13 AM   Specimen: Nasopharyngeal Swab; Nasopharyngeal(NP) swabs in vial transport medium  Result Value Ref Range Status   SARS Coronavirus 2 by RT PCR POSITIVE (A) NEGATIVE Final    Comment: RESULT CALLED TO, READ BACK BY AND VERIFIED WITH: AMBER BIESECKER RN 0127 01/14/21 HNM (NOTE) SARS-CoV-2 target  nucleic acids are DETECTED.  The SARS-CoV-2 RNA is generally detectable in upper respiratory specimens during the acute phase of infection. Positive results are indicative of the presence of the identified virus, but do not rule out bacterial infection or co-infection with other pathogens not detected by the test. Clinical correlation with patient history and other diagnostic information is necessary to determine patient infection status. The expected result is Negative.  Fact Sheet for Patients: EntrepreneurPulse.com.au  Fact Sheet for Healthcare Providers: IncredibleEmployment.be  This test is not yet approved or cleared by the [place] FDA and  has been authorized for detection and/or diagnosis of SARS-CoV-2 by FDA under an Emergency Use Authorization (EUA).  This EUA will remain in effect (meaning this test can  be used) for the duration of  the COVID-19 declaration under Section 564(b)(1) of the Act, 21 U.S.C. section 360bbb-3(b)(1), unless the authorization is terminated or revoked sooner.     Influenza A by PCR NEGATIVE NEGATIVE Final   Influenza B by PCR NEGATIVE NEGATIVE Final    Comment: (NOTE) The Xpert Xpress SARS-CoV-2/FLU/RSV plus assay is intended as an aid in the diagnosis of influenza from Nasopharyngeal swab specimens and should not be used as a sole basis for treatment. Nasal washings and aspirates are unacceptable for Xpert Xpress SARS-CoV-2/FLU/RSV testing.  Fact Sheet for Patients: EntrepreneurPulse.com.au  Fact Sheet for Healthcare Providers: IncredibleEmployment.be  This test is not yet approved or cleared by the [place] FDA and has been authorized for detection and/or diagnosis of SARS-CoV-2 by FDA under an Emergency Use Authorization (EUA). This EUA will remain in effect (meaning this test can be used) for the duration of the COVID-19 declaration under Section  564(b)(1) of the Act, 21 U.S.C. section 360bbb-3(b)(1), unless the authorization is terminated or revoked.  Performed at [facility], [address] [place], [place] [address]       Radiology Studies: CT ANGIO CHEST PE W OR WO CONTRAST  Result Date: 01/17/2021 CLINICAL DATA:  Shortness of breath EXAM: CT ANGIOGRAPHY CHEST WITH CONTRAST TECHNIQUE: Multidetector CT imaging of the chest was performed using the standard protocol during bolus administration of intravenous contrast. Multiplanar CT image reconstructions and MIPs were obtained to evaluate the vascular anatomy. CONTRAST:  65mL OMNIPAQUE IOHEXOL 350  MG/ML SOLN COMPARISON:  12/15/2020 FINDINGS: Cardiovascular: Satisfactory opacification of the pulmonary arteries to the segmental level. No evidence of pulmonary embolism. Stable heart size. Left chest wall dual lead pacemaker. No pericardial effusion. Coronary artery calcification. Mediastinum/Nodes: No enlarged lymph nodes. Esophagus is unremarkable. Lungs/Pleura: Decreased partially loculated moderate to large right pleural effusion. Increased moderate effusion. Persistent near complete atelectasis right middle and lower lobes. Partial right upper lobe atelectasis. Partial left lower lobe atelectasis. Upper Abdomen: No acute abnormality. Musculoskeletal: No acute osseous abnormality. Review of the MIP images confirms the above findings. IMPRESSION: No evidence of acute pulmonary embolism. Decreased moderate to large right pleural effusion with improved right lung aeration. Persistent near total atelectasis of the right lower and middle lobes. Increased moderate left pleural effusion with partial left lower lobe atelectasis. Electronically Signed   By: Macy Mis M.D.   On: 01/17/2021 11:24    Scheduled Meds: . ascorbic acid  500 mg Oral Daily  . atorvastatin  80 mg Oral QPM  . bacitracin   Topical BID  . carbidopa-levodopa  1 tablet Oral TID  . divalproex  250 mg Oral TID   . donepezil  10 mg Oral QHS  . enoxaparin (LOVENOX) injection  40 mg Subcutaneous Q24H  . famotidine  20 mg Oral BID  . furosemide  60 mg Intravenous BID  . memantine  10 mg Oral BID  . methylPREDNISolone (SOLU-MEDROL) injection  40 mg Intravenous Q8H  . metoprolol succinate  12.5 mg Oral Daily  . mirtazapine  15 mg Oral QHS   Continuous Infusions:    LOS: 6 days   Time spent: 35 minutes.  Patrecia Pour, MD [facility] [email or web address] 01/19/2021, 10:03 AM

## 2021-01-20 DIAGNOSIS — U071 COVID-19: Secondary | ICD-10-CM | POA: Diagnosis not present

## 2021-01-20 DIAGNOSIS — I5021 Acute systolic (congestive) heart failure: Secondary | ICD-10-CM | POA: Diagnosis not present

## 2021-01-20 DIAGNOSIS — I509 Heart failure, unspecified: Secondary | ICD-10-CM | POA: Diagnosis not present

## 2021-01-20 DIAGNOSIS — J9621 Acute and chronic respiratory failure with hypoxia: Secondary | ICD-10-CM | POA: Diagnosis not present

## 2021-01-20 MED ORDER — DEXAMETHASONE 6 MG PO TABS
6.0000 mg | ORAL_TABLET | Freq: Every day | ORAL | Status: DC
  Administered 2021-01-21: 6 mg via ORAL
  Filled 2021-01-20 (×2): qty 1

## 2021-01-20 NOTE — Progress Notes (Signed)
PROGRESS NOTE  Chelsey Kimberley  LFY:[id number] DOB: January 10, 1924 DOA: 01/13/2021 PCP: [name], MD   Brief Narrative: Julen Rubert is a 87 y.o. male with a history of 3L oxygen-dependent COPD, chronic HFrEF, Parkinson's disease, AFib, dementia, depression, HLD who presented to the ED after falling out of his bed at home with resultant left forehead abrasion. In the ED he was tachycardic with oxygen requirement above baseline at 4L O2. BNP 582. ECG with AFib with RVR. CT cervical spine and head without acute fracture, dislocation or hemorrhage. CTA chest revealed bilateral perihilar infiltrates/edema and right >> left pleural effusion. IR consulted for repeat thoracentesis and cardiology consulted for assistance with diuresis. Covid-19 screening PCR was positive. CRP 3.2, PCT <0.10, d-dimer 5.66. Remdesivir, decadron, IV lasix were started. Pulmonary consulted for consideration of chest tube. Palliative also consulted to navigate goals of care decisions as the patient has been followed by hospice PTA. Family has decided against any thoracentesis or chest tube after discussion with PCCM and palliative care. They opt to transition to comfort measures, discharge back to ALF under the care of hospice with no plans to return to the hospital. The ALF is unable to accept the patient until completion of 10 days of isolation.   Assessment & Plan: Active Problems:   Acute CHF (congestive heart failure) (HCC)  Acute on chronic hypoxic respiratory failure: Multifactorial.  - Treat etiologies listed below, will transition to comfort measures primarily. - Continue supplemental oxygen to maintain normal work of breathing and SpO2 >88%. Not making much headway despite treating etiologies as below, still on 6L at rest. Suspect pleural effusions and associated atelectasis limiting effective pulmonary parenchyma which will not likely improve without fluid drainage. CTA chest showed no PE.  Recurrent right > left  pleural effusion: D/w IR who reports loculations on U/S during last attempt, suspects chest tube may be necessary. PCT negative.  - Case discussed at length with IR and PCCM. The complexity of the fluid collection suggests low utility of thoracentesis, that chest tube would be required for definitive management. Family does not feel this is within the patient's goals of care/wishes and declines after deliberation.   Acute on chronic HFrEF: Echo Aug 2021 w/LVEF 30-35%, mod AS.  - Continue lasix at home dose of 40mg  po BID. IV diuresis was pursued without improvement in hypoxia and with bumping creatinine. No further lab monitoring per [place] conversations. - Monitor I/O, daily weights, metabolic panel.  - Hold entresto, etc. due to comfort measures  Covid-19 pneumonia: - Completed remdesivir x5 days (4/27 - 5/1)   - Continue steroids, will deescalate to decadron with sustained diminution in CRP.   - Can come off isolation on 01/24/2021.   PAF with RVR:  - Continue metoprolol to avoid RVR/palpitations. DC'ed amiodarone as pt has not maintained rhythm control. Rate control is satisfactory.  - Avoiding anticoagulation with high fall risk.   Stage IIIa CKD: Putative Dx based on available CrCl. Will not continue checking labs. - Avoid nephrotoxins if able.    CAD, HLD: +Moderate calcifications on CTA chest from March. No chest pain, cardiology does not suspect ACS. - Beta blocker, DC statin, holding ASA due to bleed risk.  Parkinson's disease, dementia, depression, very HOH:  - Continue sinemet - Continue aricept, namenda, remeron, depakote.   Fall at home: No lacerations requiring repair in ED, negative trauma C spine/CT head.  - Per [place] discussions, will not continue rehabilitation efforts.  Elevated TSH: 9.763, though free T4 wnl.  -  No further work up currently planned.  DVT prophylaxis: SCDs Code Status: DNR Family Communication: None at bedside. Spoke with son by phone 5/2,  5/3 Disposition Plan:  Status is: Inpatient  Remains inpatient appropriate because:Unsafe d/c plan; per LCSW facility can accept the patient on 5/8.  Dispo: The patient is from: ALF              Anticipated d/c is to: ALF with hospice              Patient currently is medically stable to d/c.   Difficult to place patient No  Consultants:   PCCM  Cardiology  Palliative care medicine team  IR  Procedures:   None  Antimicrobials:  Remdesivir 4/27 - 5/1   Subjective: Eager for breakfast, denies pain anywhere. Shortness of breath is stable "not getting any better."   Objective: Vitals:   01/20/21 0800 01/20/21 0946 01/20/21 1200 01/20/21 1216  BP: 119/73  107/69 113/82  Pulse: 70  78 98  Resp: 20  (!) 29 20  Temp: 98 F (36.7 C)  98.1 F (36.7 C) 98 F (36.7 C)  TempSrc: Oral  Oral Oral  SpO2: 96%  98%   Weight:  64.9 kg      Intake/Output Summary (Last 24 hours) at 01/20/2021 1430 Last data filed at 01/20/2021 1207 Gross per 24 hour  Intake 240 ml  Output 1200 ml  Net -960 ml   Filed Weights   01/18/21 0800 01/20/21 0515 01/20/21 0946  Weight: 62.3 kg 62.5 kg 64.9 kg   Gen: 87 y.o. male in no distress Pulm: Nonlabored tachypnea on 6L humidified O2, very diminished bilaterally. CV: Irreg irreg. No murmur, rub, or gallop. No JVD, no dependent edema. GI: Abdomen soft, non-tender, non-distended, with normoactive bowel sounds.  Ext: Warm, no deformities Skin: No new rashes, lesions or ulcers on visualized skin. Neuro: Alert and incompletely oriented. No focal neurological deficits. Psych: Judgement and insight appear impaired. Mood euthymic & affect congruent. Behavior is appropriate.    Data Reviewed: I have personally reviewed following labs and imaging studies  CBC: Recent Labs  Lab 01/15/21 0631 01/16/21 0439 01/17/21 0429 01/18/21 0425 01/19/21 0601  WBC 5.1 5.9 7.8 7.5 7.8  NEUTROABS 4.3 5.5 7.2 7.1 7.3  HGB 10.7* 10.5* 10.9* 11.1* 11.4*  HCT  35.3* 34.3* 35.5* 35.0* 36.2*  MCV 98.1 96.9 97.0 94.3 93.8  PLT 209 218 207 199 384   Basic Metabolic Panel: Recent Labs  Lab 01/13/21 2116 01/14/21 0512 01/15/21 0631 01/16/21 0439 01/17/21 0429 01/18/21 0425 01/19/21 0601  NA  --    < > 142 141 138 137 139  K  --    < > 3.7 3.8 4.1 4.0 4.1  CL  --    < > 99 101 96* 96* 95*  CO2  --    < > 31 30 30 30  32  GLUCOSE  --    < > 96 129* 119* 134* 124*  BUN  --    < > 35* 36* 44* 49* 56*  CREATININE  --    < > 1.41* 1.16 1.23 1.20 1.34*  CALCIUM  --    < > 8.0* 7.9* 8.0* 7.6* 7.8*  MG 2.6*  --   --   --   --   --   --    < > = values in this interval not displayed.   GFR: Estimated Creatinine Clearance: 30.9 mL/min (A) (by C-G formula based on SCr  of 1.34 mg/dL (H)). Liver Function Tests: Recent Labs  Lab 01/15/21 0631 01/16/21 0439 01/17/21 0429 01/18/21 0425 01/19/21 0601  AST 20 14* 16 23 23   ALT 9 12 6 6 10   ALKPHOS 64 54 54 54 53  BILITOT 0.7 0.7 0.8 0.8 0.7  PROT 6.3* 6.4* 6.4* 5.9* 6.2*  ALBUMIN 2.5* 2.6* 2.5* 2.4* 2.5*   Thyroid Function Tests: No results for input(s): TSH, T4TOTAL, FREET4, T3FREE, THYROIDAB in the last 72 hours. Recent Results (from the past 240 hour(s))  Resp Panel by RT-PCR (Flu A&B, Covid) Nasopharyngeal Swab     Status: Abnormal   Collection Time: 01/14/21 12:13 AM   Specimen: Nasopharyngeal Swab; Nasopharyngeal(NP) swabs in vial transport medium  Result Value Ref Range Status   SARS Coronavirus 2 by RT PCR POSITIVE (A) NEGATIVE Final    Comment: RESULT CALLED TO, READ BACK BY AND VERIFIED WITH: AMBER BIESECKER RN 0127 01/14/21 HNM (NOTE) SARS-CoV-2 target nucleic acids are DETECTED.  The SARS-CoV-2 RNA is generally detectable in upper respiratory specimens during the acute phase of infection. Positive results are indicative of the presence of the identified virus, but do not rule out bacterial infection or co-infection with other pathogens not detected by the test. Clinical  correlation with patient history and other diagnostic information is necessary to determine patient infection status. The expected result is Negative.  Fact Sheet for Patients: EntrepreneurPulse.com.au  Fact Sheet for Healthcare Providers: IncredibleEmployment.be  This test is not yet approved or cleared by the [place] FDA and  has been authorized for detection and/or diagnosis of SARS-CoV-2 by FDA under an Emergency Use Authorization (EUA).  This EUA will remain in effect (meaning this test can  be used) for the duration of  the COVID-19 declaration under Section 564(b)(1) of the Act, 21 U.S.C. section 360bbb-3(b)(1), unless the authorization is terminated or revoked sooner.     Influenza A by PCR NEGATIVE NEGATIVE Final   Influenza B by PCR NEGATIVE NEGATIVE Final    Comment: (NOTE) The Xpert Xpress SARS-CoV-2/FLU/RSV plus assay is intended as an aid in the diagnosis of influenza from Nasopharyngeal swab specimens and should not be used as a sole basis for treatment. Nasal washings and aspirates are unacceptable for Xpert Xpress SARS-CoV-2/FLU/RSV testing.  Fact Sheet for Patients: EntrepreneurPulse.com.au  Fact Sheet for Healthcare Providers: IncredibleEmployment.be  This test is not yet approved or cleared by the [place] FDA and has been authorized for detection and/or diagnosis of SARS-CoV-2 by FDA under an Emergency Use Authorization (EUA). This EUA will remain in effect (meaning this test can be used) for the duration of the COVID-19 declaration under Section 564(b)(1) of the Act, 21 U.S.C. section 360bbb-3(b)(1), unless the authorization is terminated or revoked.  Performed at [facility], [address] [place], [place] [address]       Radiology Studies: No results found.  Scheduled Meds: . bacitracin   Topical BID  . carbidopa-levodopa  1 tablet Oral TID  .  divalproex  250 mg Oral TID  . donepezil  10 mg Oral QHS  . famotidine  20 mg Oral BID  . furosemide  40 mg Oral BID  . memantine  10 mg Oral BID  . methylPREDNISolone (SOLU-MEDROL) injection  40 mg Intravenous Q8H  . metoprolol succinate  12.5 mg Oral Daily  . mirtazapine  15 mg Oral QHS   Continuous Infusions:    LOS: 7 days   Time spent: 35 minutes.  Patrecia Pour, MD [facility]  [email or web address] 01/20/2021, 2:30 PM

## 2021-01-20 NOTE — Progress Notes (Signed)
   01/20/21 1200  Assess: MEWS Score  Temp 98.1 F (36.7 C)  BP 107/69  Pulse Rate 78  ECG Heart Rate 87  Resp (!) 29  Level of Consciousness Alert  SpO2 98 %  Assess: MEWS Score  MEWS Temp 0  MEWS Systolic 0  MEWS Pulse 0  MEWS RR 2  MEWS LOC 0  MEWS Score 2  MEWS Score Color Yellow  Assess: if the MEWS score is Yellow or Red  Were vital signs taken at a resting state? Yes  Focused Assessment Change from prior assessment (see assessment flowsheet)  Early Detection of Sepsis Score *See Row Information* Low  MEWS guidelines implemented *See Row Information* No, vital signs rechecked  Treat  MEWS Interventions Other (Comment)  Pain Scale 0-10  Pain Score 0  Notify: Charge Nurse/RN  Name of Charge Nurse/RN Notified Britt Bolognese RN  Date Charge Nurse/RN Notified 01/20/21  Time Charge Nurse/RN Notified 1214  Document  Patient Outcome Other (Comment) (Stable)  Progress note created (see row info) Yes

## 2021-01-20 NOTE — TOC Progression Note (Signed)
Transition of Care [facility]) - Progression Note    Patient Details  Name: Eric Hendricks MRN: [id number] Date of Birth: 1924/08/18  [facility] CM/SW Contact  Eileen Stanford, LCSW Phone Number: 01/20/2021, 9:38 AM  Clinical Narrative:   Pt is receiving hosp[facility] care at [facility] and families wishes are for pt to return and resume these services. CSW will reach out to [facility].         Expected Discharge Plan and Services                                                 Social Determinants of Health (SDOH) Interventions    Readmission Risk Interventions No flowsheet data found.

## 2021-01-20 NOTE — NC FL2 (Signed)
[place] LEVEL OF CARE SCREENING TOOL     IDENTIFICATION  Patient Name: Eric Hendricks Birthdate: 1924/03/10 Sex: male Admission Date (Current Location): 01/13/2021  [place] and [place] Number:  Engineering geologist and Address:  [facility], [address] [place], [place] [address]      Provider Number: [id number]  Attending Physician Name and Address:  Patrecia Pour, MD  Relative Name and Phone Number:       Current Level of Care: Hospital Recommended Level of Care: [place] Prior Approval Number:    Date Approved/Denied:   PASRR Number:    Discharge Plan: Other (Comment) (ALF)    Current Diagnoses: Patient Active Problem List   Diagnosis Date Noted  . Acute CHF (congestive heart failure) ([place]) 01/13/2021  . S/P thoracentesis   . Pleural effusion due to CHF (congestive heart failure) ([place]) 12/15/2020  . Acute respiratory failure with hypoxia ([place]) 05/10/2020  . Acute on chronic systolic CHF (congestive heart failure) ([place]) 05/10/2020  . Pleural effusion, right 05/09/2020  . Acute on chronic systolic heart failure ([place]) 11/21/2019  . AF (paroxysmal atrial fibrillation) ([place]) 11/21/2019  . Depression 11/21/2019  . Dementia due to Parkinson's disease without behavioral disturbance ([place]) 11/21/2019    Orientation RESPIRATION BLADDER Height & Weight     Self,Place,Time  O2 (HFNC 6L) Incontinent Weight: 143 lb 1.6 oz (64.9 kg) Height:     BEHAVIORAL SYMPTOMS/MOOD NEUROLOGICAL BOWEL NUTRITION STATUS      Incontinent Diet (heart healthy, thin liquids)  AMBULATORY STATUS COMMUNICATION OF NEEDS Skin   Limited Assist Verbally Skin abrasions (open wound on sacrum)                       Personal Care Assistance Level of Assistance  Bathing,Feeding,Dressing Bathing Assistance: Limited assistance Feeding assistance: Independent Dressing Assistance: Limited assistance     Functional Limitations Info   Sight,Hearing,Speech Sight Info: Adequate Hearing Info: Adequate Speech Info: Adequate    SPECIAL CARE FACTORS FREQUENCY  PT (By licensed PT),OT (By licensed OT)     PT Frequency: 2x OT Frequency: 2x            Contractures Contractures Info: Not present    Additional Factors Info  Code Status,Allergies,Isolation Precautions Code Status Info: DNR Allergies Info: Phenylephrine-guaifenesin, Aspirin, Guaifenesin, Guanfacine, Phenylephrine, Suprax (Cefixime)     Isolation Precautions Info: AIR/CON precautions     Current Medications (01/20/2021):  This is the current hospital active medication list Current Facility-Administered Medications  Medication Dose Route Frequency Provider Last Rate Last Admin  . acetaminophen (TYLENOL) tablet 650 mg  650 mg Oral Q6H PRN Mansy, Jan A, MD       Or  . acetaminophen (TYLENOL) suppository 650 mg  650 mg Rectal Q6H PRN Mansy, Jan A, MD      . antiseptic oral rinse (BIOTENE) solution 15 mL  15 mL Topical PRN Pickenpack-Cousar, Carlena Sax, NP      . bacitracin ointment   Topical BID Patrecia Pour, MD   Given at 01/20/21 [phone]  . benzonatate (TESSALON) capsule 100 mg  100 mg Oral TID PRN Patrecia Pour, MD   100 mg at 01/20/21 0845  . carbidopa-levodopa (SINEMET IR) 25-100 MG per tablet immediate release 1 tablet  1 tablet Oral TID Mansy, Arvella Merles, MD   1 tablet at 01/20/21 1324  . [START ON 01/21/2021] dexamethasone (DECADRON) tablet 6 mg  6 mg Oral Daily Patrecia Pour,  MD      . divalproex (DEPAKOTE) DR tablet 250 mg  250 mg Oral TID Mansy, Jan A, MD   250 mg at 01/20/21 0845  . docusate sodium (COLACE) capsule 100 mg  100 mg Oral BID PRN Mansy, Jan A, MD      . donepezil (ARICEPT) tablet 10 mg  10 mg Oral QHS Mansy, Jan A, MD   10 mg at 01/19/21 2144  . famotidine (PEPCID) tablet 20 mg  20 mg Oral BID Mansy, Jan A, MD   20 mg at 01/20/21 0845  . furosemide (LASIX) tablet 40 mg  40 mg Oral BID Patrecia Pour, MD   40 mg at 01/20/21 0845  .  glycopyrrolate (ROBINUL) injection 0.3 mg  0.3 mg Intravenous Q4H PRN Pickenpack-Cousar, Athena N, NP      . LORazepam (ATIVAN) tablet 1 mg  1 mg Oral Q4H PRN Pickenpack-Cousar, Carlena Sax, NP       Or  . LORazepam (ATIVAN) injection 1 mg  1 mg Intravenous Q4H PRN Pickenpack-Cousar, Athena N, NP      . magnesium hydroxide (MILK OF MAGNESIA) suspension 30 mL  30 mL Oral Daily PRN Mansy, Jan A, MD      . memantine [facility]) tablet 10 mg  10 mg Oral BID Mansy, Jan A, MD   10 mg at 01/20/21 0845  . metoprolol succinate (TOPROL-XL) 24 hr tablet 12.5 mg  12.5 mg Oral Daily Mansy, Jan A, MD   12.5 mg at 01/20/21 0845  . mirtazapine (REMERON) tablet 15 mg  15 mg Oral QHS Mansy, Jan A, MD   15 mg at 01/19/21 2144  . morphine CONCENTRATE 10 MG/0.5ML oral solution 5-10 mg  5-10 mg Oral Q2H PRN Pickenpack-Cousar, Athena N, NP       Or  . morphine CONCENTRATE 10 MG/0.5ML oral solution 5-10 mg  5-10 mg Sublingual Q2H PRN Pickenpack-Cousar, Carlena Sax, NP      . ondansetron (ZOFRAN) tablet 4 mg  4 mg Oral Q6H PRN Mansy, Jan A, MD       Or  . ondansetron [facility]) injection 4 mg  4 mg Intravenous Q6H PRN Mansy, Jan A, MD      . polyvinyl alcohol (LIQUIFILM TEARS) 1.4 % ophthalmic solution 2 drop  2 drop Both Eyes Q4H PRN Mansy, Jan A, MD      . traZODone (DESYREL) tablet 25 mg  25 mg Oral QHS PRN Mansy, Jan A, MD   25 mg at 01/17/21 2153     Discharge Medications: Please see discharge summary for a list of discharge medications.  Relevant Imaging Results:  Relevant Lab Results:   Additional Information SSN:[id number]  Eileen Stanford, LCSW

## 2021-01-20 NOTE — Progress Notes (Signed)
    Chart Reviewed. Patient Assessed.   Patient is awake and alert. Very hard of hearing. Continues to have dyspnea with minimal exertion and at times during rest. Denies pain.  No family at the bedside.   Son has been updated. Clear in expressed goals to focus care on patient's comfort. Family is hopeful patient can return back to [facility] with outpatient hospice support with a goal of continued comfort care and no re-hospitalizations.   Education provided on hospice outpatient. Son states patient was receiving hospice care prior to hospitalization.   All questions answered and support provided.   Plan -continue care to focus on comfort/symptom management -no escalation in care/no further work-up -PRNs for symptom management. Continue medications (lasix and Parkinson's medications) for symptom management and comfort -Pending discharge back to [facility] with hospice in place once approved (TOC aware) -PMT will continue to support and follow as needed. Please call with urgent needs.   The above conversation was completed via telephone due to the visitor restrictions during the COVID-19 pandemic. Thorough chart review and discussion with necessary members of the care team was completed as part of assessment. All issues were discussed and addressed but no physical exam was performed.  Time Total: 35 min.   Visit consisted of counseling and education dealing with the complex and emotionally intense issues of symptom management and palliative care in the setting of serious and potentially life-threatening illness.Greater than 50%  of this time was spent counseling and coordinating care related to the above assessment and plan.  Alda Lea, AGPCNP-BC  Palliative Medicine Team [phone]

## 2021-01-21 DIAGNOSIS — J9621 Acute and chronic respiratory failure with hypoxia: Secondary | ICD-10-CM | POA: Diagnosis not present

## 2021-01-21 DIAGNOSIS — I5021 Acute systolic (congestive) heart failure: Secondary | ICD-10-CM | POA: Diagnosis not present

## 2021-01-21 DIAGNOSIS — J1282 Pneumonia due to coronavirus disease 2019: Secondary | ICD-10-CM | POA: Diagnosis not present

## 2021-01-21 DIAGNOSIS — U071 COVID-19: Secondary | ICD-10-CM | POA: Diagnosis not present

## 2021-01-21 MED ORDER — DEXAMETHASONE 6 MG PO TABS: 6.0000 mg | ORAL_TABLET | Freq: Every day | ORAL | 0 refills | Status: AC

## 2021-01-21 NOTE — Plan of Care (Signed)

## 2021-01-21 NOTE — Plan of Care (Signed)
DISCHARGE NOTE HOME Eric Hendricks to be discharged to [facility] ALF per MD order. Discussed prescriptions and follow up appointments with the patient's son. Medication list explained in detail. Patient verbalized understanding.  IV catheter discontinued intact. Site without signs and symptoms of complications. Dressing and pressure applied. Pt denies pain at the site currently. No complaints noted.  An After Visit Summary (AVS) was printed and given to the patient's son. Patient escorted via wheelchair, and discharged via private auto.  Stephan Minister, RN

## 2021-01-21 NOTE — Progress Notes (Signed)
Pulmonary Medicine          Date: 01/21/2021,   MRN# [id number] Eric Hendricks 1923-11-16     AdmissionWeight: 65.1 kg                 CurrentWeight: 64.8 kg   Referring physician: Dr Bonner Puna   CHIEF COMPLAINT:   Acute on chronic hypoxemic respiratory failure with recurrent pleural effusion   HISTORY OF PRESENT ILLNESS   As per admission h/p this is a39 y.o. Caucasian male with medical history significant for systolic CHF, atrial fibrillation, Parkinson's disease, dyslipidemia, dementia and depression, who presented to emergency room after having an accidental fall with subsequent left forehead abrasion.  The patient apparently rolled from his bed while asleep and immediately woke up.  He denied any presyncope or syncope.  No chest pain or palpitations.  He had a small abrasion to the left shoulder as well.  No paresthesias or focal muscle weakness.  No dysuria, oliguria or hematuria or flank pain reported.  Is a fairly poor historian due to his dementia.  Per the patient's son he has apparently been followed by hospice at his SNF.   When he came to the ER heart rate was 115 with possibility of her percent on 4 L of O2 nasal cannula as his pulse oximetry was 88% on his baseline 3 L.  Vital signs were otherwise normal.  CMP was remarkable for a BUN of 44 and creatinine 1.57 compared to 36 and 1.42 on 12/18/2020.  BNP was 582.  CBC showed mild anemia close to baseline.  Respiratory panel is currently pending.  Head and C-spine CT showed no acute intracranial abnormalities and chronic small vessel ischemic disease with chronic atrophy and normal alignment of the C-spine with degenerative changes as well as bilateral pleural effusions CTA revealed large right pleural effusion with subtotal collapse of the right lung and small left pleural effusion, moderate coronary artery calcification, aortic atherosclerosis with no PE.  PCCM consultation placed for loculated right pleural effusion.  I  discussed case with Attending physician over phone after my evaluation of imaging and labs.  Patient is calm non tachypneic.  He diuresed well, he is COVID+ and had negative test last month so this seems to be new case.  He is on 3L [place] and with sPO2>95%  01/16/21- patient resting in bed in no distress. His BP is mildy low today.  I discussed with IR regarding thora/chest tube it seems that multiloculated effusion is precluding thoracentesis so we agreed on chest tube placement and IR team is in communication with POA son [name].   01/17/21- patient is unchanged from previous. Family has reported they do not wish to have any procedure.   01/18/21- Patient is stable resting in bed in no distress, he is alone no visitors. He is with hearing loss bilaterally.  Family is working with palliative on goals of care and currently conservative management is elected by family. Pt is DNR  01/21/21- patient is stable, he is not tachypneic. There is plan for possible comfort care. I will sign off at this time and am available if needed.   PAST MEDICAL HISTORY   Past Medical History:  Diagnosis Date  . Arrhythmia    atrial fibrillation  . B12 deficiency   . CHF (congestive heart failure) ([place])   . Dementia ([place])   . Depression   . Hyperlipemia   . Parkinson's disease ([place])   . Prostate cancer ([place])  SURGICAL HISTORY   Past Surgical History:  Procedure Laterality Date  . ANKLE SURGERY Right   . APPENDECTOMY    . CATARACT EXTRACTION Bilateral   . TRANSURETHRAL RESECTION OF PROSTATE       FAMILY HISTORY   Family History  Problem Relation Age of Onset  . Colon cancer Father   . Tremor Mother   . Parkinson's disease Brother   . Prostate cancer Brother   . Lung cancer Sister   . Emphysema Sister   . Heart failure Child   . Diabetes Child   . AAA (abdominal aortic aneurysm) Child      SOCIAL HISTORY   Social History   Tobacco Use  . Smoking status: Never Smoker  . Smokeless tobacco:  Never Used  Vaping Use  . Vaping Use: Never used  Substance Use Topics  . Alcohol use: No    Alcohol/week: 0.0 standard drinks  . Drug use: No     MEDICATIONS    Home Medication:    Current Medication:  Current Facility-Administered Medications:  .  acetaminophen (TYLENOL) tablet 650 mg, 650 mg, Oral, Q6H PRN **OR** acetaminophen (TYLENOL) suppository 650 mg, 650 mg, Rectal, Q6H PRN, Mansy, Jan A, MD .  antiseptic oral rinse (BIOTENE) solution 15 mL, 15 mL, Topical, PRN, Pickenpack-Cousar, Carlena Sax, NP .  bacitracin ointment, , Topical, BID, Patrecia Pour, MD, Given at 01/20/21 2202 .  benzonatate (TESSALON) capsule 100 mg, 100 mg, Oral, TID PRN, Patrecia Pour, MD, 100 mg at 01/20/21 0845 .  carbidopa-levodopa (SINEMET IR) 25-100 MG per tablet immediate release 1 tablet, 1 tablet, Oral, TID, Mansy, Jan A, MD, 1 tablet at 01/20/21 1645 .  dexamethasone (DECADRON) tablet 6 mg, 6 mg, Oral, Daily, Vance Gather B, MD .  divalproex (DEPAKOTE) DR tablet 250 mg, 250 mg, Oral, TID, Mansy, Jan A, MD, 250 mg at 01/20/21 2159 .  docusate sodium (COLACE) capsule 100 mg, 100 mg, Oral, BID PRN, Mansy, Jan A, MD .  donepezil (ARICEPT) tablet 10 mg, 10 mg, Oral, QHS, Mansy, Jan A, MD, 10 mg at 01/20/21 2159 .  famotidine (PEPCID) tablet 20 mg, 20 mg, Oral, BID, Mansy, Jan A, MD, 20 mg at 01/20/21 2200 .  furosemide (LASIX) tablet 40 mg, 40 mg, Oral, BID, Patrecia Pour, MD, 40 mg at 01/20/21 1645 .  glycopyrrolate (ROBINUL) injection 0.3 mg, 0.3 mg, Intravenous, Q4H PRN, Pickenpack-Cousar, Athena N, NP .  LORazepam (ATIVAN) tablet 1 mg, 1 mg, Oral, Q4H PRN **OR** LORazepam (ATIVAN) injection 1 mg, 1 mg, Intravenous, Q4H PRN, Pickenpack-Cousar, Athena N, NP .  magnesium hydroxide (MILK OF MAGNESIA) suspension 30 mL, 30 mL, Oral, Daily PRN, Mansy, Jan A, MD .  [facility]) tablet 10 mg, 10 mg, Oral, BID, Mansy, Jan A, MD, 10 mg at 01/20/21 2201 .  metoprolol succinate (TOPROL-XL) 24 hr tablet 12.5  mg, 12.5 mg, Oral, Daily, Mansy, Jan A, MD, 12.5 mg at 01/20/21 0845 .  mirtazapine (REMERON) tablet 15 mg, 15 mg, Oral, QHS, Mansy, Jan A, MD, 15 mg at 01/20/21 2159 .  morphine CONCENTRATE 10 MG/0.5ML oral solution 5-10 mg, 5-10 mg, Oral, Q2H PRN **OR** morphine CONCENTRATE 10 MG/0.5ML oral solution 5-10 mg, 5-10 mg, Sublingual, Q2H PRN, Pickenpack-Cousar, Athena N, NP .  ondansetron (ZOFRAN) tablet 4 mg, 4 mg, Oral, Q6H PRN **OR** ondansetron (ZOFRAN) injection 4 mg, 4 mg, Intravenous, Q6H PRN, Mansy, Jan A, MD .  polyvinyl alcohol (LIQUIFILM TEARS) 1.4 % ophthalmic solution 2 drop, 2  drop, Both Eyes, Q4H PRN, Mansy, Jan A, MD .  traZODone (DESYREL) tablet 25 mg, 25 mg, Oral, QHS PRN, Mansy, Jan A, MD, 25 mg at 01/20/21 2159    ALLERGIES   Phenylephrine-guaifenesin, Aspirin, Guaifenesin, Guanfacine, Phenylephrine, and Suprax [cefixime]     REVIEW OF SYSTEMS    Review of Systems:  Gen:  Denies  fever, sweats, chills weigh loss  HEENT: Denies blurred vision, double vision, ear pain, eye pain, hearing loss, nose bleeds, sore throat Cardiac:  No dizziness, chest pain or heaviness, chest tightness,edema Resp:   Denies cough or sputum porduction, shortness of breath,wheezing, hemoptysis,  Gi: Denies swallowing difficulty, stomach pain, nausea or vomiting, diarrhea, constipation, bowel incontinence Gu:  Denies bladder incontinence, burning urine Ext:   Denies Joint pain, stiffness or swelling Skin: Denies  skin rash, easy bruising or bleeding or hives Endoc:  Denies polyuria, polydipsia , polyphagia or weight change Psych:   Denies depression, insomnia or hallucinations   Other:  All other systems negative   VS: BP (!) 82/52 (BP Location: Right Arm)   Pulse 70   Temp 98.7 F (37.1 C)   Resp 16   Wt 64.8 kg   SpO2 93%   BMI 21.73 kg/m      PHYSICAL EXAM    GENERAL:NAD, no fevers, chills, no weakness no fatigue HEAD: Normocephalic, atraumatic.  EYES: Pupils equal, round,  reactive to light. Extraocular muscles intact. No scleral icterus.  MOUTH: Moist mucosal membrane. Dentition intact. No abscess noted.  EAR, NOSE, THROAT: Clear without exudates. No external lesions.  NECK: Supple. No thyromegaly. No nodules. No JVD.  PULMONARY: decreased air entry worse on right CARDIOVASCULAR: S1 and S2. Regular rate and rhythm. No murmurs, rubs, or gallops. No edema. Pedal pulses 2+ bilaterally.  GASTROINTESTINAL: Soft, nontender, nondistended. No masses. Positive bowel sounds. No hepatosplenomegaly.  MUSCULOSKELETAL: No swelling, clubbing, or edema. Range of motion full in all extremities.  NEUROLOGIC: Cranial nerves II through XII are intact. No gross focal neurological deficits. Sensation intact. Reflexes intact.  SKIN: No ulceration, lesions, rashes, or cyanosis. Skin warm and dry. Turgor intact.  PSYCHIATRIC: Mood, affect within normal limits. The patient is awake, alert and oriented x 3. Insight, judgment intact.       IMAGING    CT Head Wo Contrast  Result Date: 01/13/2021 CLINICAL DATA:  [place] with bruises to the forehead.  Neck trauma. EXAM: CT HEAD WITHOUT CONTRAST CT CERVICAL SPINE WITHOUT CONTRAST TECHNIQUE: Multidetector CT imaging of the head and cervical spine was performed following the standard protocol without intravenous contrast. Multiplanar CT image reconstructions of the cervical spine were also generated. COMPARISON:  CT head and cervical spine 07/31/2018 FINDINGS: CT HEAD FINDINGS Brain: No evidence of acute infarction, hemorrhage, hydrocephalus, extra-axial collection or mass lesion/mass effect. Diffuse cerebral atrophy. Ventricular dilatation likely due to central atrophy. Patchy low-attenuation changes in the deep white matter consistent small vessel ischemia. Vascular: Moderate intracranial arterial vascular calcifications. Skull: Calvarium appears intact. Sinuses/Orbits: Paranasal sinuses and mastoid air cells are clear. Other: None. CT CERVICAL  SPINE FINDINGS Alignment: Normal alignment Skull base and vertebrae: No acute fracture. No primary bone lesion or focal pathologic process. Soft tissues and spinal canal: No prevertebral fluid or swelling. No visible canal hematoma. Disc levels: Degenerative changes with narrowed interspaces and endplate hypertrophic change most prominent at C5-6 and C6-7 levels. Prominent ligamentous calcifications. Degenerative changes in the facet joints. Upper chest: Bilateral pleural effusions are demonstrated in the apices mid Other: Spur calcifications IMPRESSION: 1.  No acute intracranial abnormalities. Chronic atrophy and small vessel ischemic changes. 2. Normal alignment of the cervical spine. Degenerative changes. No acute displaced fractures identified. 3. Bilateral pleural effusions. Electronically Signed   By: Lucienne Capers M.D.   On: 01/13/2021 22:06   CT ANGIO CHEST PE W OR WO CONTRAST  Result Date: 01/17/2021 CLINICAL DATA:  Shortness of breath EXAM: CT ANGIOGRAPHY CHEST WITH CONTRAST TECHNIQUE: Multidetector CT imaging of the chest was performed using the standard protocol during bolus administration of intravenous contrast. Multiplanar CT image reconstructions and MIPs were obtained to evaluate the vascular anatomy. CONTRAST:  31mL OMNIPAQUE IOHEXOL 350 MG/ML SOLN COMPARISON:  12/15/2020 FINDINGS: Cardiovascular: Satisfactory opacification of the pulmonary arteries to the segmental level. No evidence of pulmonary embolism. Stable heart size. Left chest wall dual lead pacemaker. No pericardial effusion. Coronary artery calcification. Mediastinum/Nodes: No enlarged lymph nodes. Esophagus is unremarkable. Lungs/Pleura: Decreased partially loculated moderate to large right pleural effusion. Increased moderate effusion. Persistent near complete atelectasis right middle and lower lobes. Partial right upper lobe atelectasis. Partial left lower lobe atelectasis. Upper Abdomen: No acute abnormality. Musculoskeletal:  No acute osseous abnormality. Review of the MIP images confirms the above findings. IMPRESSION: No evidence of acute pulmonary embolism. Decreased moderate to large right pleural effusion with improved right lung aeration. Persistent near total atelectasis of the right lower and middle lobes. Increased moderate left pleural effusion with partial left lower lobe atelectasis. Electronically Signed   By: Macy Mis M.D.   On: 01/17/2021 11:24   CT Cervical Spine Wo Contrast  Result Date: 01/13/2021 CLINICAL DATA:  [place] with bruises to the forehead.  Neck trauma. EXAM: CT HEAD WITHOUT CONTRAST CT CERVICAL SPINE WITHOUT CONTRAST TECHNIQUE: Multidetector CT imaging of the head and cervical spine was performed following the standard protocol without intravenous contrast. Multiplanar CT image reconstructions of the cervical spine were also generated. COMPARISON:  CT head and cervical spine 07/31/2018 FINDINGS: CT HEAD FINDINGS Brain: No evidence of acute infarction, hemorrhage, hydrocephalus, extra-axial collection or mass lesion/mass effect. Diffuse cerebral atrophy. Ventricular dilatation likely due to central atrophy. Patchy low-attenuation changes in the deep white matter consistent small vessel ischemia. Vascular: Moderate intracranial arterial vascular calcifications. Skull: Calvarium appears intact. Sinuses/Orbits: Paranasal sinuses and mastoid air cells are clear. Other: None. CT CERVICAL SPINE FINDINGS Alignment: Normal alignment Skull base and vertebrae: No acute fracture. No primary bone lesion or focal pathologic process. Soft tissues and spinal canal: No prevertebral fluid or swelling. No visible canal hematoma. Disc levels: Degenerative changes with narrowed interspaces and endplate hypertrophic change most prominent at C5-6 and C6-7 levels. Prominent ligamentous calcifications. Degenerative changes in the facet joints. Upper chest: Bilateral pleural effusions are demonstrated in the apices mid Other:  Spur calcifications IMPRESSION: 1. No acute intracranial abnormalities. Chronic atrophy and small vessel ischemic changes. 2. Normal alignment of the cervical spine. Degenerative changes. No acute displaced fractures identified. 3. Bilateral pleural effusions. Electronically Signed   By: Lucienne Capers M.D.   On: 01/13/2021 22:06   DG Chest Port 1 View  Result Date: 01/15/2021 CLINICAL DATA:  Hypoxic respiratory failure EXAM: PORTABLE CHEST 1 VIEW COMPARISON:  01/13/2021 FINDINGS: Large, partially loculated right pleural effusion is again identified, stable since prior examination with associated right basilar compressive atelectasis. Lung volumes are small. Superimposed perihilar interstitial edema, best appreciated within the left lung, appears slightly improved. No pneumothorax. No pleural effusion on the left. Left subclavian dual lead pacemaker is unchanged. Cardiac size within normal limits. No acute bone abnormality.  IMPRESSION: Stable large right partially loculated pleural effusion. Improving superimposed mild interstitial pulmonary edema Electronically Signed   By: Fidela Salisbury MD   On: 01/15/2021 01:46   DG Chest Port 1 View  Result Date: 01/13/2021 CLINICAL DATA:  Cough and shortness of breath.  Weakness and fall. EXAM: PORTABLE CHEST 1 VIEW COMPARISON:  12/19/2020 FINDINGS: Cardiac pacemaker. Shallow inspiration. Heart size is obscured but appears enlarged. Bilateral perihilar infiltration/edema. Moderate right pleural effusion. Progression of changes since previous study. Calcification of the aorta. IMPRESSION: Progression of bilateral perihilar infiltration/edema and right pleural effusion. Electronically Signed   By: Lucienne Capers M.D.   On: 01/13/2021 21:28      ASSESSMENT/PLAN   Acute on chronic hypoxemic respiratory failure Acute COVID19 pneumonia -Remdesevir antiviral - pharmacy protocol 5 d -vitamin C -zinc -jIV steroids  -Diuresis - Lasix 60 IV - monitor UOP -  utilize external urinary catheter if possible -No need to  Self prone -encourage to use IS and Acapella device for bronchopulmonary hygiene when able -d/c hepatotoxic medications while on remdesevir -supportive care  monitoring -PT/OT when possible -procalcitonin, CRP, ddimer and ferritin trending   Acute on chronic HFrEF 30-35%  - cardiology on case appreciate input  - continuing diuresis - 1200cc uop overnight -he is malnourished and has low albumin , will add once daily IV albumin while on lasix to potentiate diuresis  -continue statins -PT/OT - high fall risk - no anticoagulation    Right pleural effusion   - no signs of bacterial pneumonia and low suspicion for para-pneumonic effusion at this time due to absence of leukocytosis, fevers, bilateral infiltrates in context of viral pneumonia induced CHF exacerbation -family wishes for conservative mgt only at this time    Thank you for allowing me to participate in the care of this patient.     Patient/Family are satisfied with care plan and all questions have been answered.  This document was prepared using Dragon voice recognition software and may include unintentional dictation errors.     Ottie Glazier, M.D.  Division of [place]

## 2021-01-21 NOTE — TOC Transition Note (Signed)
Transition of Care [facility]) - CM/SW Discharge Note   Patient Details  Name: Eric Hendricks MRN: [id number] Date of Birth: May 29, 1924  [facility]) CM/SW Contact:  Eric Stanford, LCSW Phone Number: 01/21/2021, 2:12 PM   Clinical Narrative:   Eric Hendricks has completed assessment and is agreeable for  pt to return to ALF. DC summary sent. Pt 02 at ALF. Pt's son is coming to pick pt up. ALF is bringing 02 for transport. Hospice will follow pt at facility.    Final next level of care: Assisted Living Barriers to Discharge: No Barriers Identified   Patient Goals and CMS Choice        Discharge Placement              Patient chooses bed at:  [facility]) Patient to be transferred to facility by: Son   Patient and family notified of of transfer: 01/21/21  Discharge Plan and Services                                     Social Determinants of Health (SDOH) Interventions     Readmission Risk Interventions No flowsheet data found.

## 2021-01-21 NOTE — Discharge Summary (Signed)
Physician Discharge Summary  Patient ID: Eric Hendricks MRN: [id number] DOB/AGE: 85-Nov-1925 85 y.o.  Admit date: 01/13/2021 Discharge date: 01/21/2021  Admission Diagnoses:  Discharge Diagnoses:  Active Problems:   Acute CHF (congestive heart failure) [facility])   Discharged Condition: poor  Hospital Course:  Eric Hendricks a87 y.o.malewith a history of 3L oxygen-dependent COPD, chronic HFrEF, Parkinson's disease, AFib, dementia, depression, HLD who presented to the ED after falling out of his bed at home with resultant left forehead abrasion. In the ED he was tachycardic with oxygen requirement above baseline at 4L O2. BNP 582. ECG with AFib with RVR. CT cervical spine and head without acute fracture, dislocation or hemorrhage. CTA chest revealed bilateral perihilar infiltrates/edema and right >> left pleural effusion. IR consulted for repeat thoracentesis and cardiology consulted for assistance with diuresis. Covid-19 screening PCR was positive. CRP 3.2, PCT <0.10, d-dimer 5.66. Remdesivir, decadron, IV lasix were started. Pulmonary consulted for consideration of chest tube. Palliative also consulted to navigate goals of care decisions as the patient has been followed by hospice PTA. Family has decided against any thoracentesis or chest tube after discussion with PCCM and palliative care. They opt to transition to comfort measures, discharge back to ALF under the care of hospice with no plans to return to the hospital.   #1.  Acute on chronic hypoxemic respiratory failure multifactorial. Acute on chronic systolic congestive heart failure. COVID-19 pneumonia. Left pleural effusion. Patient received diuretics, steroids.  Family had refused thoracentesis. Patient conditions still serious, however, she has overall very poor prognosis.  Patient will be transferred to hospice care in his assisted living facility.  #2.  Paroxysmal atrial fibrillation with rapid ventricle response. Heart rate is  better at time of discharge.  3.  Parkinson disease, severe dementia. Depression. Resume home medicines.  #4.  Stage IIIa chronic kidney disease.  5.  Coronary disease.  6.  Fall at home.  #7.  Hypothyroidism. Elevated TSH, due to poor prognosis, no additional work-up or treatment is needed.    Consults: None  Significant Diagnostic Studies:  CT ANGIOGRAPHY CHEST WITH CONTRAST  TECHNIQUE: Multidetector CT imaging of the chest was performed using the standard protocol during bolus administration of intravenous contrast. Multiplanar CT image reconstructions and MIPs were obtained to evaluate the vascular anatomy.  CONTRAST:  22mL OMNIPAQUE IOHEXOL 350 MG/ML SOLN  COMPARISON:  12/15/2020  FINDINGS: Cardiovascular: Satisfactory opacification of the pulmonary arteries to the segmental level. No evidence of pulmonary embolism. Stable heart size. Left chest wall dual lead pacemaker. No pericardial effusion. Coronary artery calcification.  Mediastinum/Nodes: No enlarged lymph nodes. Esophagus is unremarkable.  Lungs/Pleura: Decreased partially loculated moderate to large right pleural effusion. Increased moderate effusion. Persistent near complete atelectasis right middle and lower lobes. Partial right upper lobe atelectasis. Partial left lower lobe atelectasis.  Upper Abdomen: No acute abnormality.  Musculoskeletal: No acute osseous abnormality.  Review of the MIP images confirms the above findings.  IMPRESSION: No evidence of acute pulmonary embolism.  Decreased moderate to large right pleural effusion with improved right lung aeration. Persistent near total atelectasis of the right lower and middle lobes.  Increased moderate left pleural effusion with partial left lower lobe atelectasis.   Electronically Signed   By: Macy Mis M.D.   On: 01/17/2021 11:24   Treatments: Lasix  Discharge Exam: Blood pressure (!) 82/52, pulse 70,  temperature 98.7 F (37.1 C), resp. rate 16, weight 64.8 kg, SpO2 93 %. General appearance: alert, cooperative and confused Resp: decreased breathing sounds Cardio:  regular rate and rhythm, S1, S2 normal, no murmur, click, rub or gallop GI: soft, non-tender; bowel sounds normal; no masses,  no organomegaly Extremities: extremities normal, atraumatic, no cyanosis or edema  Disposition: Discharge disposition: 50-Hospice/Home       Discharge Instructions    Diet - low sodium heart healthy   Complete by: As directed    Discharge wound care:   Complete by: As directed    Follow with hospice   Increase activity slowly   Complete by: As directed      Allergies as of 01/21/2021      Reactions   Phenylephrine-guaifenesin Palpitations   Aspirin Other (See Comments)   Guaifenesin Other (See Comments)   Unknown reaction   Guanfacine    Phenylephrine    Suprax [cefixime]       Medication List    STOP taking these medications   amiodarone 100 MG tablet Commonly known as: PACERONE   atorvastatin 80 MG tablet Commonly known as: LIPITOR     TAKE these medications   acetaminophen 325 MG tablet Commonly known as: TYLENOL Take 650 mg by mouth 2 (two) times daily.   acetaminophen 500 MG tablet Commonly known as: TYLENOL Take 500 mg by mouth daily as needed for mild pain.   ascorbic acid 500 MG tablet Commonly known as: VITAMIN C Take 500 mg by mouth daily.   aspirin EC 81 MG tablet Take 81 mg by mouth daily.   carbidopa-levodopa 25-100 MG tablet Commonly known as: SINEMET IR Take 1 tablet by mouth 3 (three) times daily. 1 tablet at 9am/1pm/5pm.  Please give meds 20 min prior to meal or 1 hour after the meal   [employer] Lubricant Eye Drops 0.4-0.3 % Soln Generic drug: Polyethyl Glycol-Propyl Glycol Place 2 drops into both eyes every 4 (four) hours as needed (dry eyes).   dexamethasone 6 MG tablet Commonly known as: DECADRON Take 1 tablet (6 mg total) by mouth daily for 3  days. Start taking on: Jan 22, 2021   divalproex 250 MG DR tablet Commonly known as: Depakote Take 1 tablet (250 mg total) by mouth 3 (three) times daily.   docusate sodium 100 MG capsule Commonly known as: COLACE Take 100 mg by mouth 2 (two) times daily as needed for mild constipation or moderate constipation.   donepezil 10 MG tablet Commonly known as: ARICEPT TAKE 1 TABLET BY MOUTH AT BEDTIME   famotidine 20 MG tablet Commonly known as: PEPCID Take 20 mg by mouth 2 (two) times daily.   furosemide 40 MG tablet Commonly known as: LASIX Take 1 tablet (40 mg total) by mouth 2 (two) times daily.   ipratropium-albuterol 0.5-2.5 (3) MG/3ML Soln Commonly known as: DUONEB Take 3 mLs by nebulization every 6 (six) hours as needed for shortness of breath.   memantine 10 MG tablet Commonly known as: NAMENDA TAKE 1 TABLET BY MOUTH TWICE A DAY   metoprolol succinate 25 MG 24 hr tablet Commonly known as: TOPROL-XL Take 0.5 tablets (12.5 mg total) by mouth daily.   mirtazapine 15 MG tablet Commonly known as: REMERON Take 15 mg by mouth at bedtime.            Discharge Care Instructions  (From admission, onward)         Start     Ordered   01/21/21 0000  Discharge wound care:       Comments: Follow with hospice   01/21/21 1321  Follow-up Information    [name], MD Follow up in 1 week(s).   Specialty: Family Medicine Contact information: [place] [id number] [phone]               Signed: Sharen Hones 01/21/2021, 1:22 PM

## 2021-01-26 ENCOUNTER — Ambulatory Visit: Payer: Medicare Other | Admitting: Family

## 2021-01-30 LAB — ACID FAST CULTURE WITH REFLEXED SENSITIVITIES (MYCOBACTERIA): Acid Fast Culture: NEGATIVE

## 2021-02-27 ENCOUNTER — Ambulatory Visit: Payer: Medicare Other | Admitting: Neurology

## 2021-04-20 DEATH — deceased

## 2021-12-25 IMAGING — DX DG CHEST 1V PORT
1 series · 1 of 1 positions shown · non-contrast
Comparison: 12/14/2018

CLINICAL DATA: Chest pain

EXAM:
PORTABLE CHEST 1 VIEW

[chest ap]
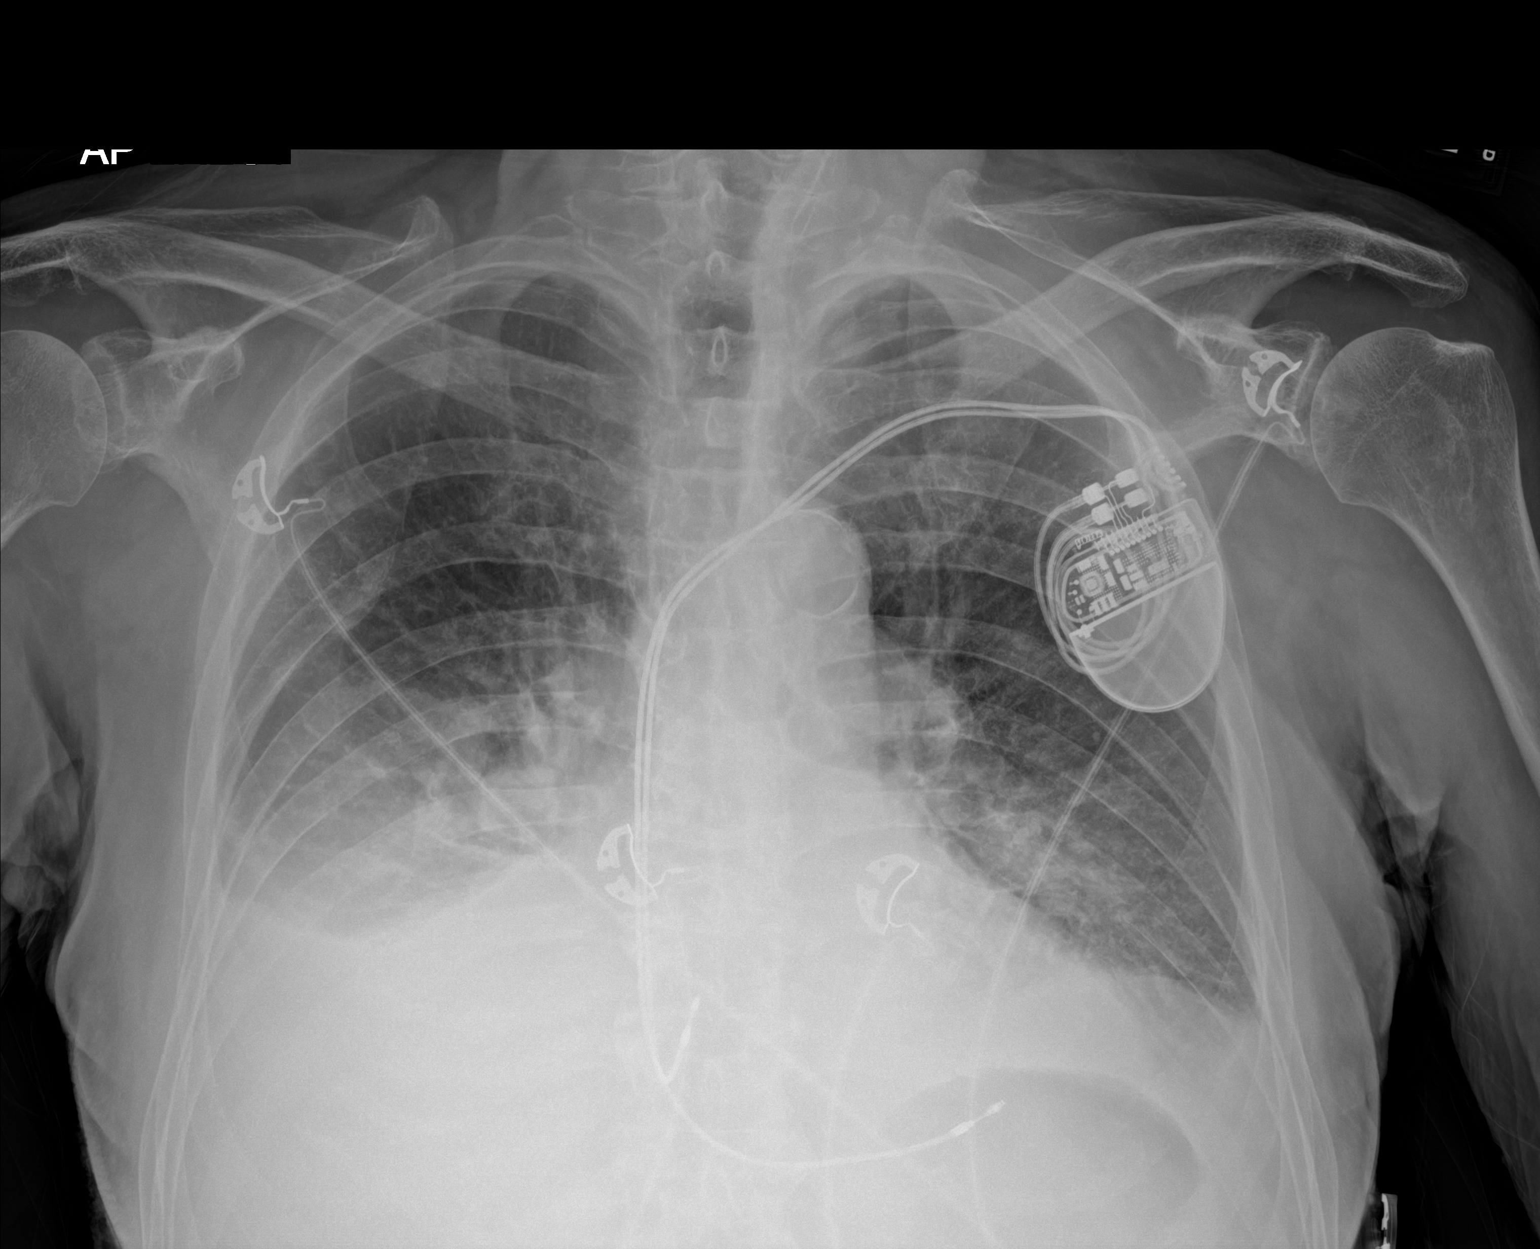

[1 of 1 positions shown; findings below may reference images not displayed]

FINDINGS: Left-sided implanted cardiac device in stable positioning. Mild
cardiomegaly. Calcific aortic knob. Small to moderate right-sided
pleural effusion. Possible trace left pleural effusion. Hazy
bibasilar opacities. No pneumothorax.
IMPRESSION: 1. Small to moderate right-sided pleural effusion. Possible trace
left pleural effusion.
2. Associated bibasilar opacities may reflect a combination of
atelectasis and/or pneumonia.

## 2023-01-19 IMAGING — CT CT ANGIO CHEST
2 of 6 series · 18 of 46 positions shown · IV contrast (APPLIED)
Comparison: 12/13/2018

CLINICAL DATA: Elevated D-dimer, dyspnea

EXAM:
CT ANGIOGRAPHY CHEST WITH CONTRAST
TECHNIQUE: Multidetector CT imaging of the chest was performed using the
standard protocol during bolus administration of intravenous
contrast. Multiplanar CT image reconstructions and MIPs were
obtained to evaluate the vascular anatomy.
CONTRAST:  75mL OMNIPAQUE IOHEXOL 350 MG/ML SOLN

[Series 5: thins · axial · 0.62mm/px · z∈[-276,-12]mm · 15 of 290 slices shown]
[im 13/290  lung]
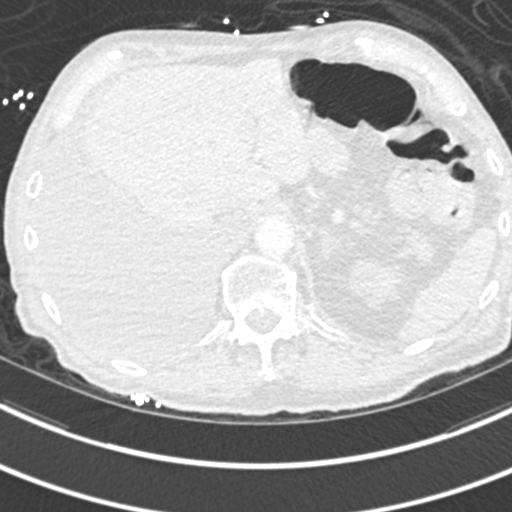
[im 38/290  soft-tissue]
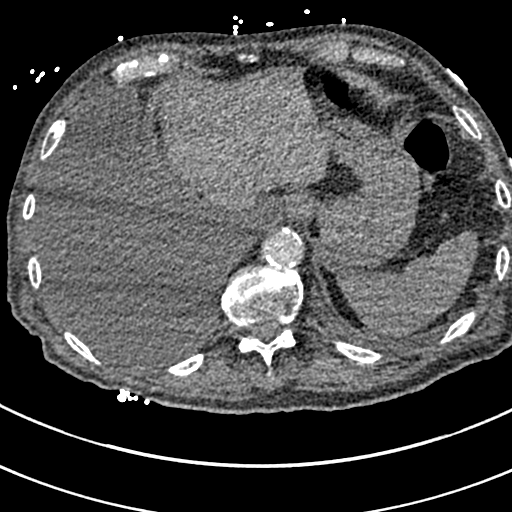
[im 51/290  lung]
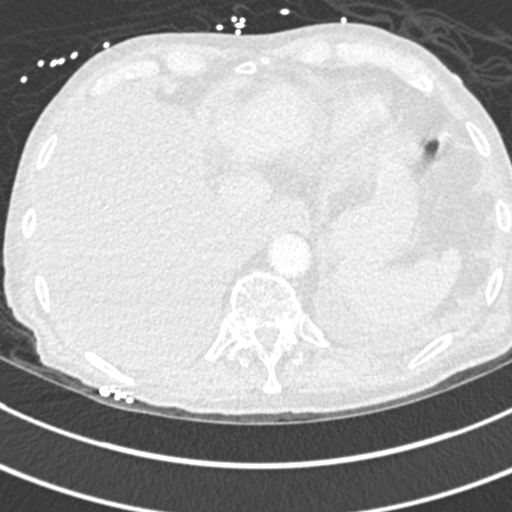
[im 76/290  soft-tissue]
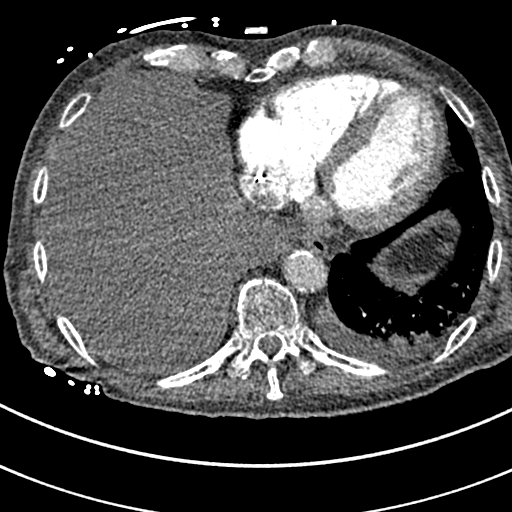
[im 88/290  lung]
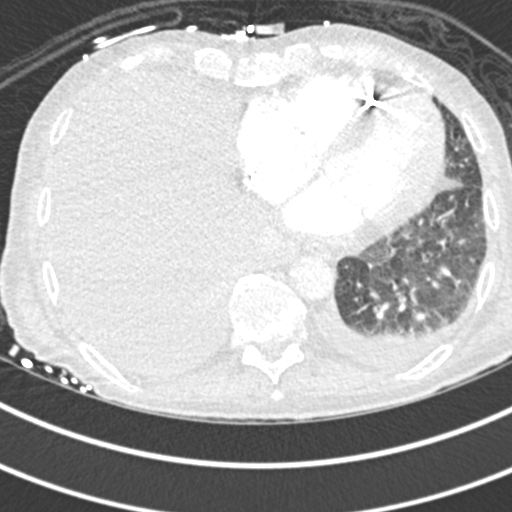
[im 114/290  soft-tissue]
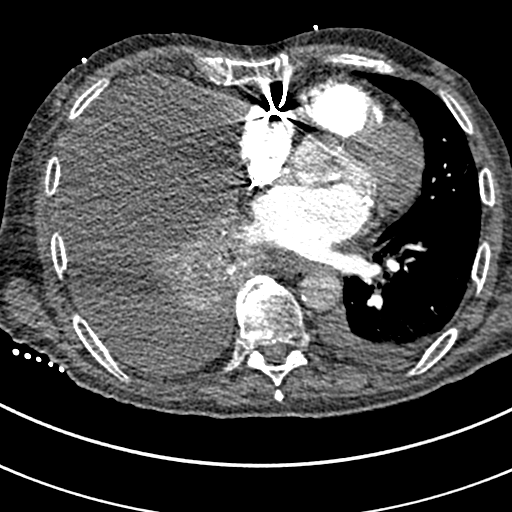
[im 126/290  lung]
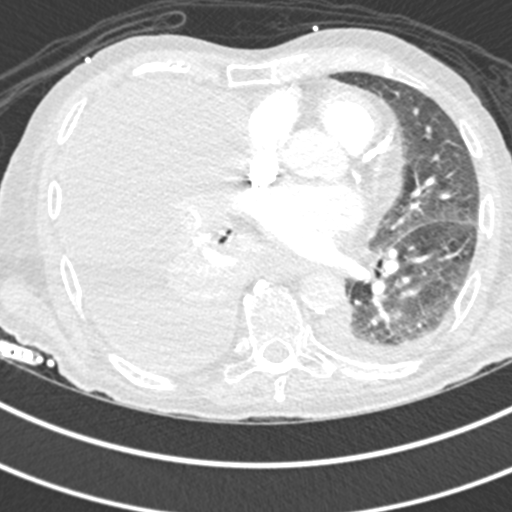
[im 151/290  soft-tissue]
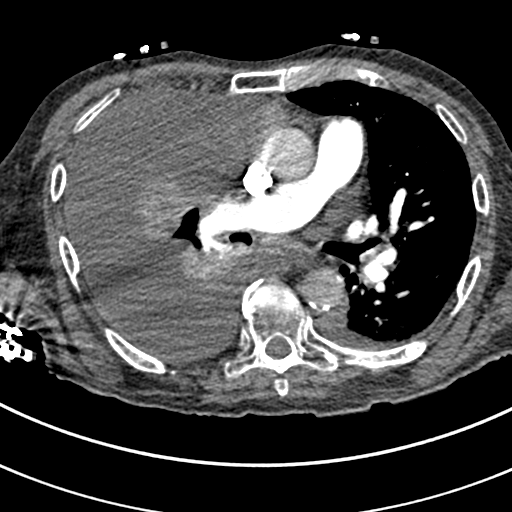
[im 164/290  lung]
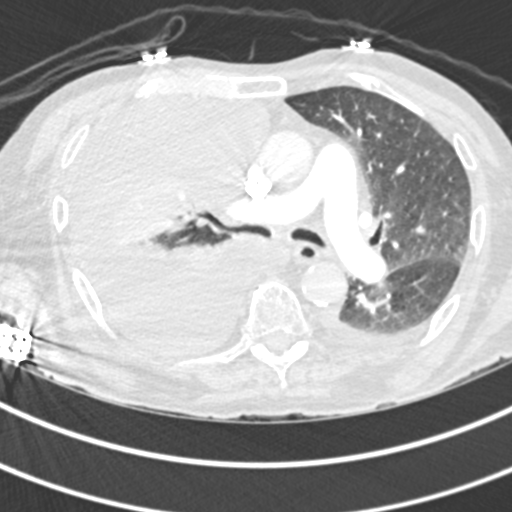
[im 176/290  soft-tissue]
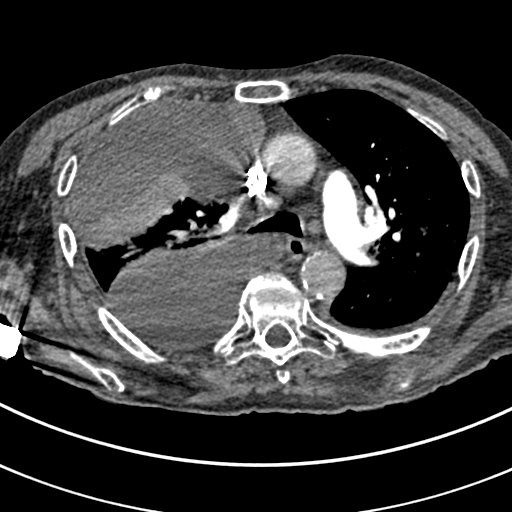
[im 202/290  lung]
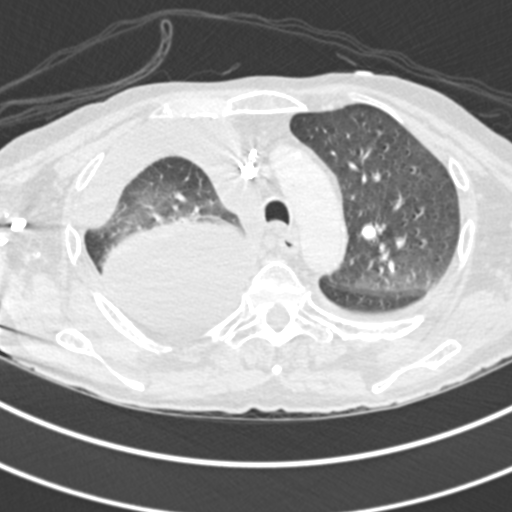
[im 214/290  soft-tissue]
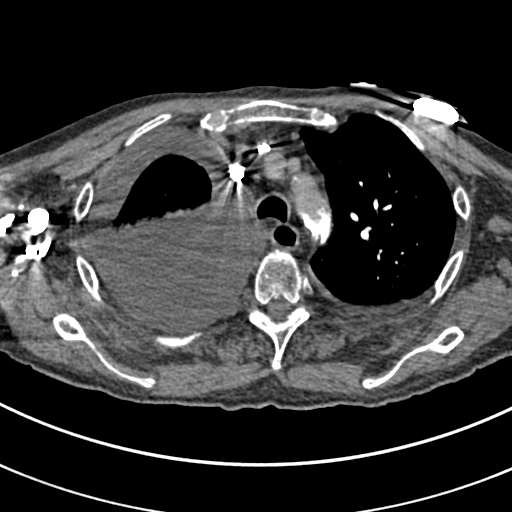
[im 239/290  lung]
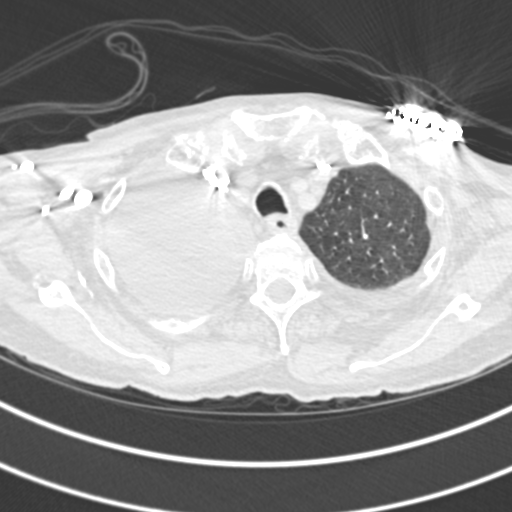
[im 252/290  soft-tissue]
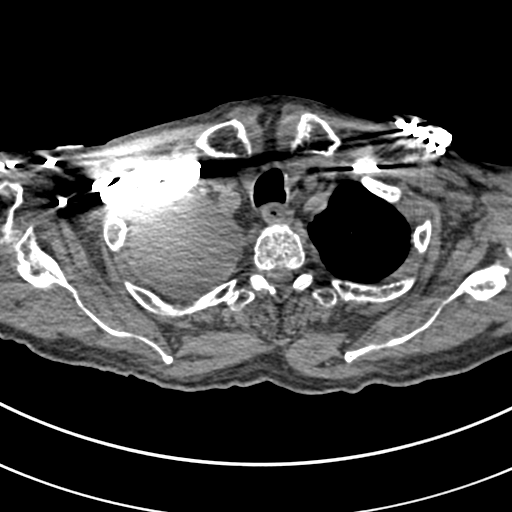
[im 277/290  lung]
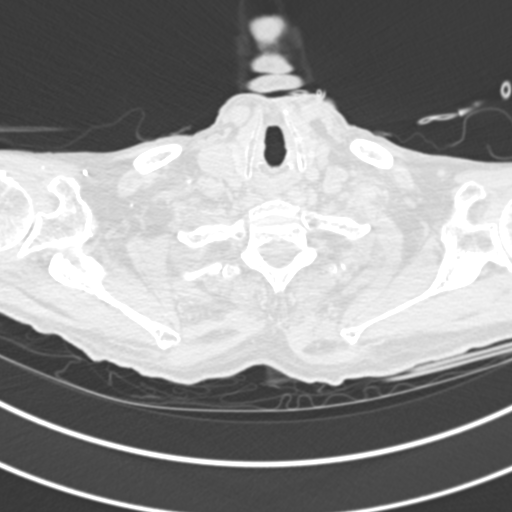

[Series 7: coronal mpr · coronal · 0.56mm/px · 3 of 82 slices shown]
[im 21/82  soft-tissue]
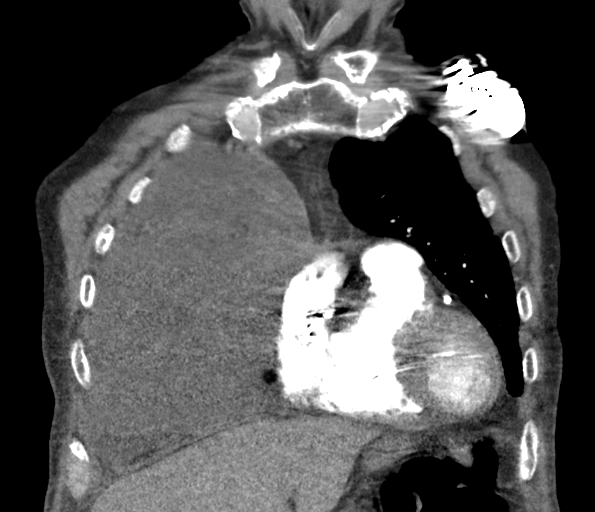
[im 41/82  soft-tissue]
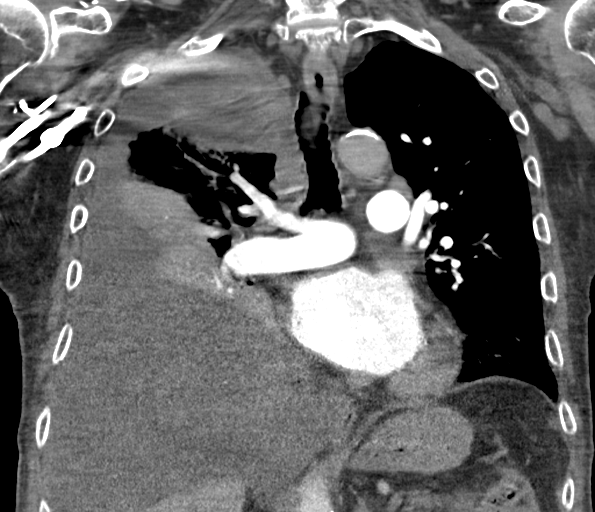
[im 61/82  soft-tissue]
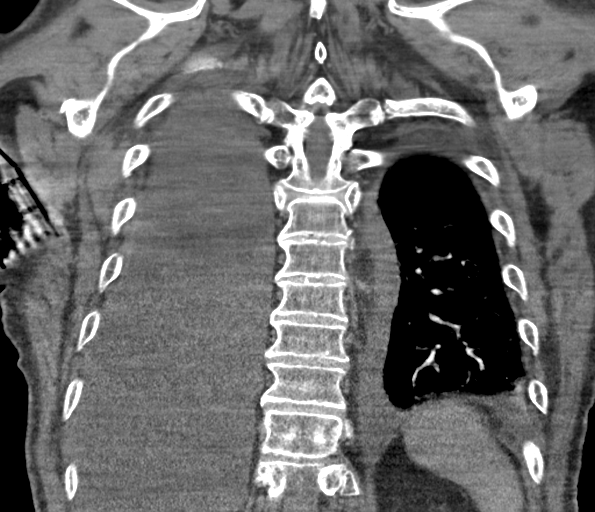

[18 of 46 positions shown; findings below may reference images not displayed]

FINDINGS: Cardiovascular: There is adequate opacification of the pulmonary
arterial tree. There is no intraluminal filling defect identified to
suggest acute pulmonary embolism. There is relative hypoenhancement
of the right pulmonary arterial tree, likely related to vascular
shunting secondary to subtotal collapse of the right lung. The
central pulmonary arteries are of normal caliber.

Moderate multi-vessel coronary artery calcification with possible
stenting of the left anterior descending coronary artery. Global
cardiac size is within normal limits. Left subclavian pacemaker
leads are seen within the right atrium and right ventricle toward
the apex. No pericardial effusion. Moderate atherosclerotic
calcification within the thoracic aorta. No aortic aneurysm.

Mediastinum/Nodes: No pathologic thoracic adenopathy. Visualized
thyroid is unremarkable. Esophagus is unremarkable.

Lungs/Pleura: There is near complete collapse of the right lung
secondary to compressive atelectasis of a large right pleural
effusion. The right middle and lower lobes are completely collapsed.
There is partial aeration of the right upper lobe. No central
obstructing mass is identified. The costophrenic angle is excluded
from view on this examination. Small left pleural effusion is
present with mild compressive atelectasis of the left lower lobe. No
superimposed focal pulmonary infiltrate. No pneumothorax.

Upper Abdomen: No acute abnormality within the visualized upper
abdomen.

Musculoskeletal: No acute bone abnormality. No lytic or blastic bone
lesion identified.

Review of the MIP images confirms the above findings.
IMPRESSION: No pulmonary embolism.

Large right pleural effusion with subtotal collapse of the right
lung. Small left pleural effusion also identified.

Moderate coronary artery calcification.

Aortic Atherosclerosis (U658B-GAO.O).

## 2023-01-19 IMAGING — CR DG CHEST 2V
2 series · 2 of 2 positions shown · non-contrast
Comparison: November 04, 2020.

CLINICAL DATA: Shortness of breath.

EXAM:
CHEST - 2 VIEW

[chest ap]
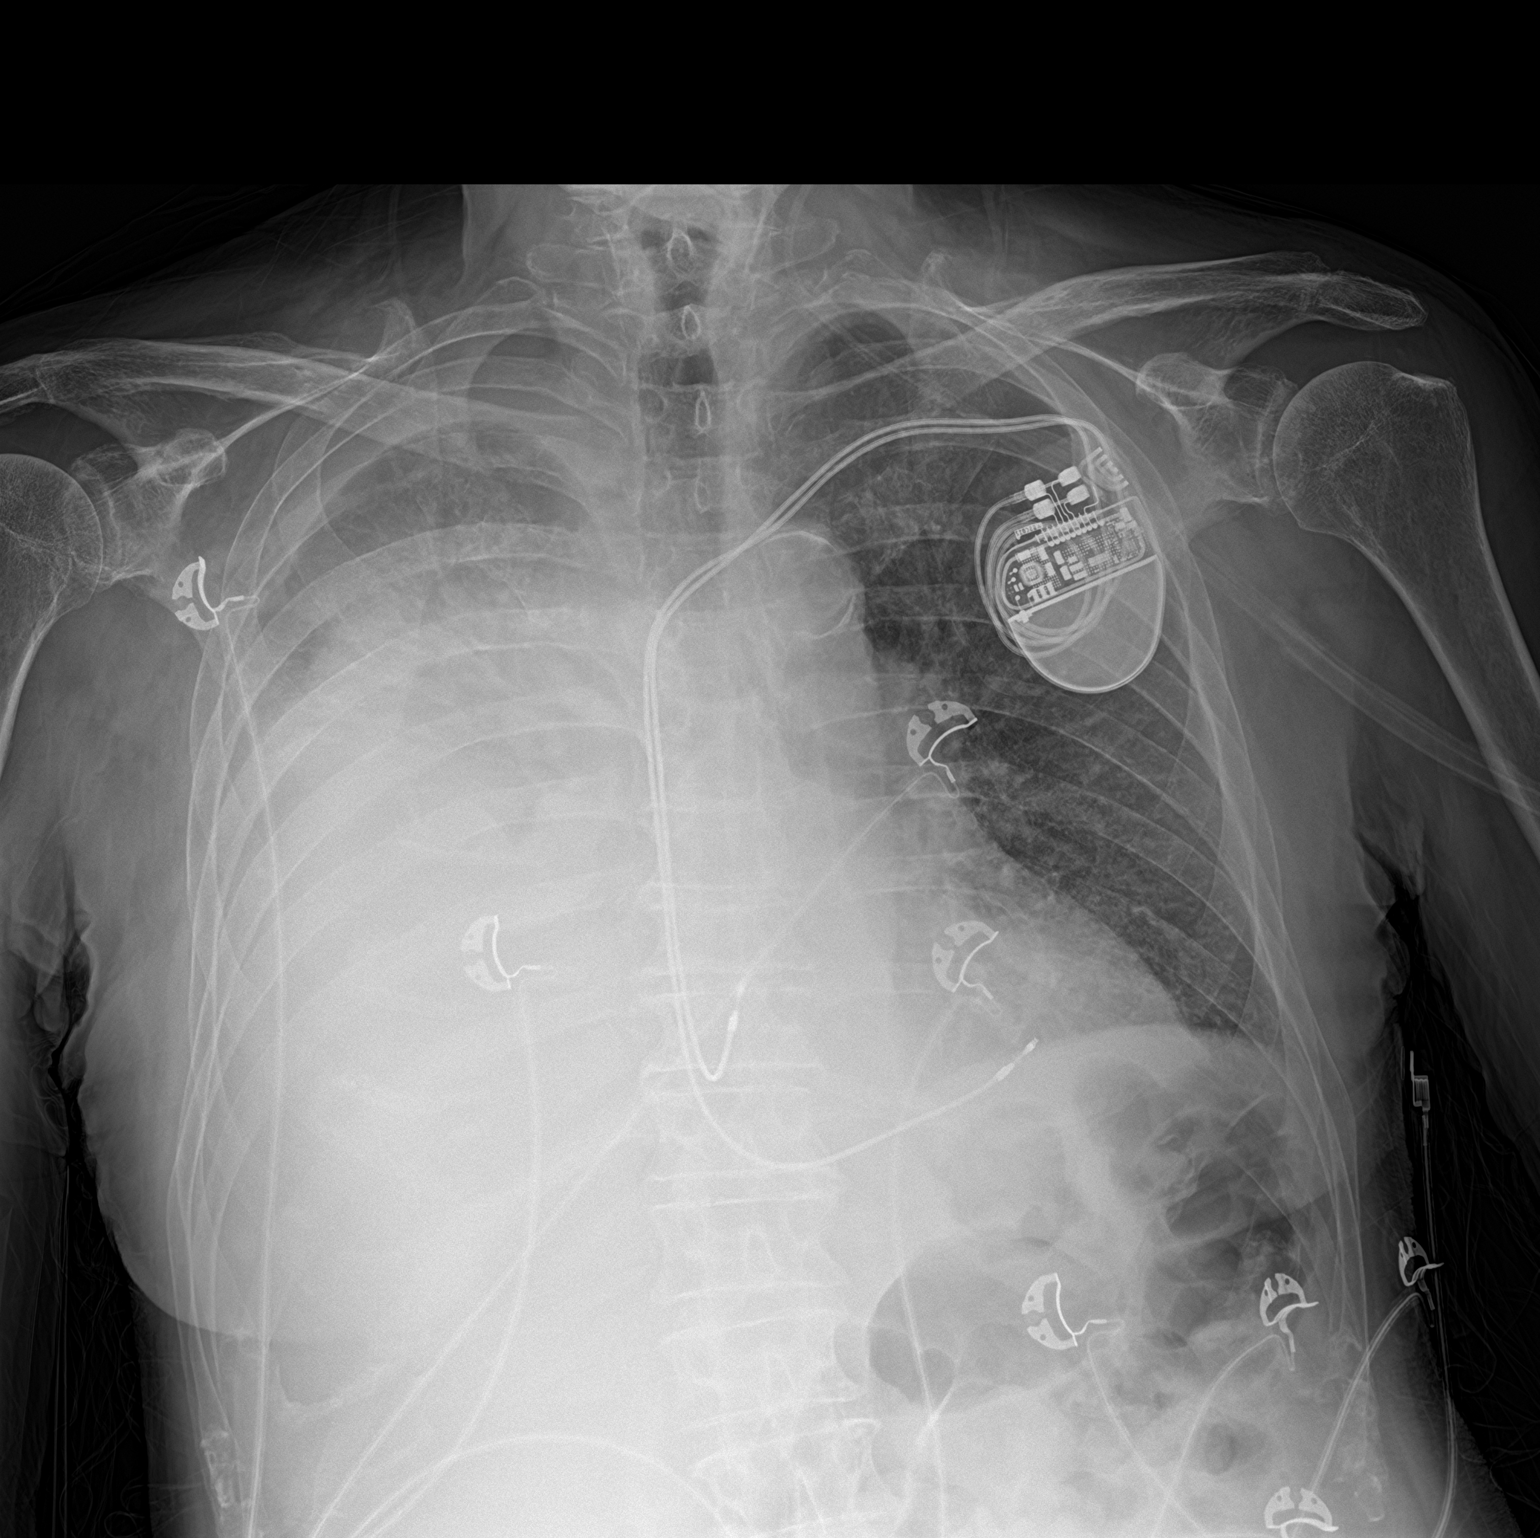

[chest lat]
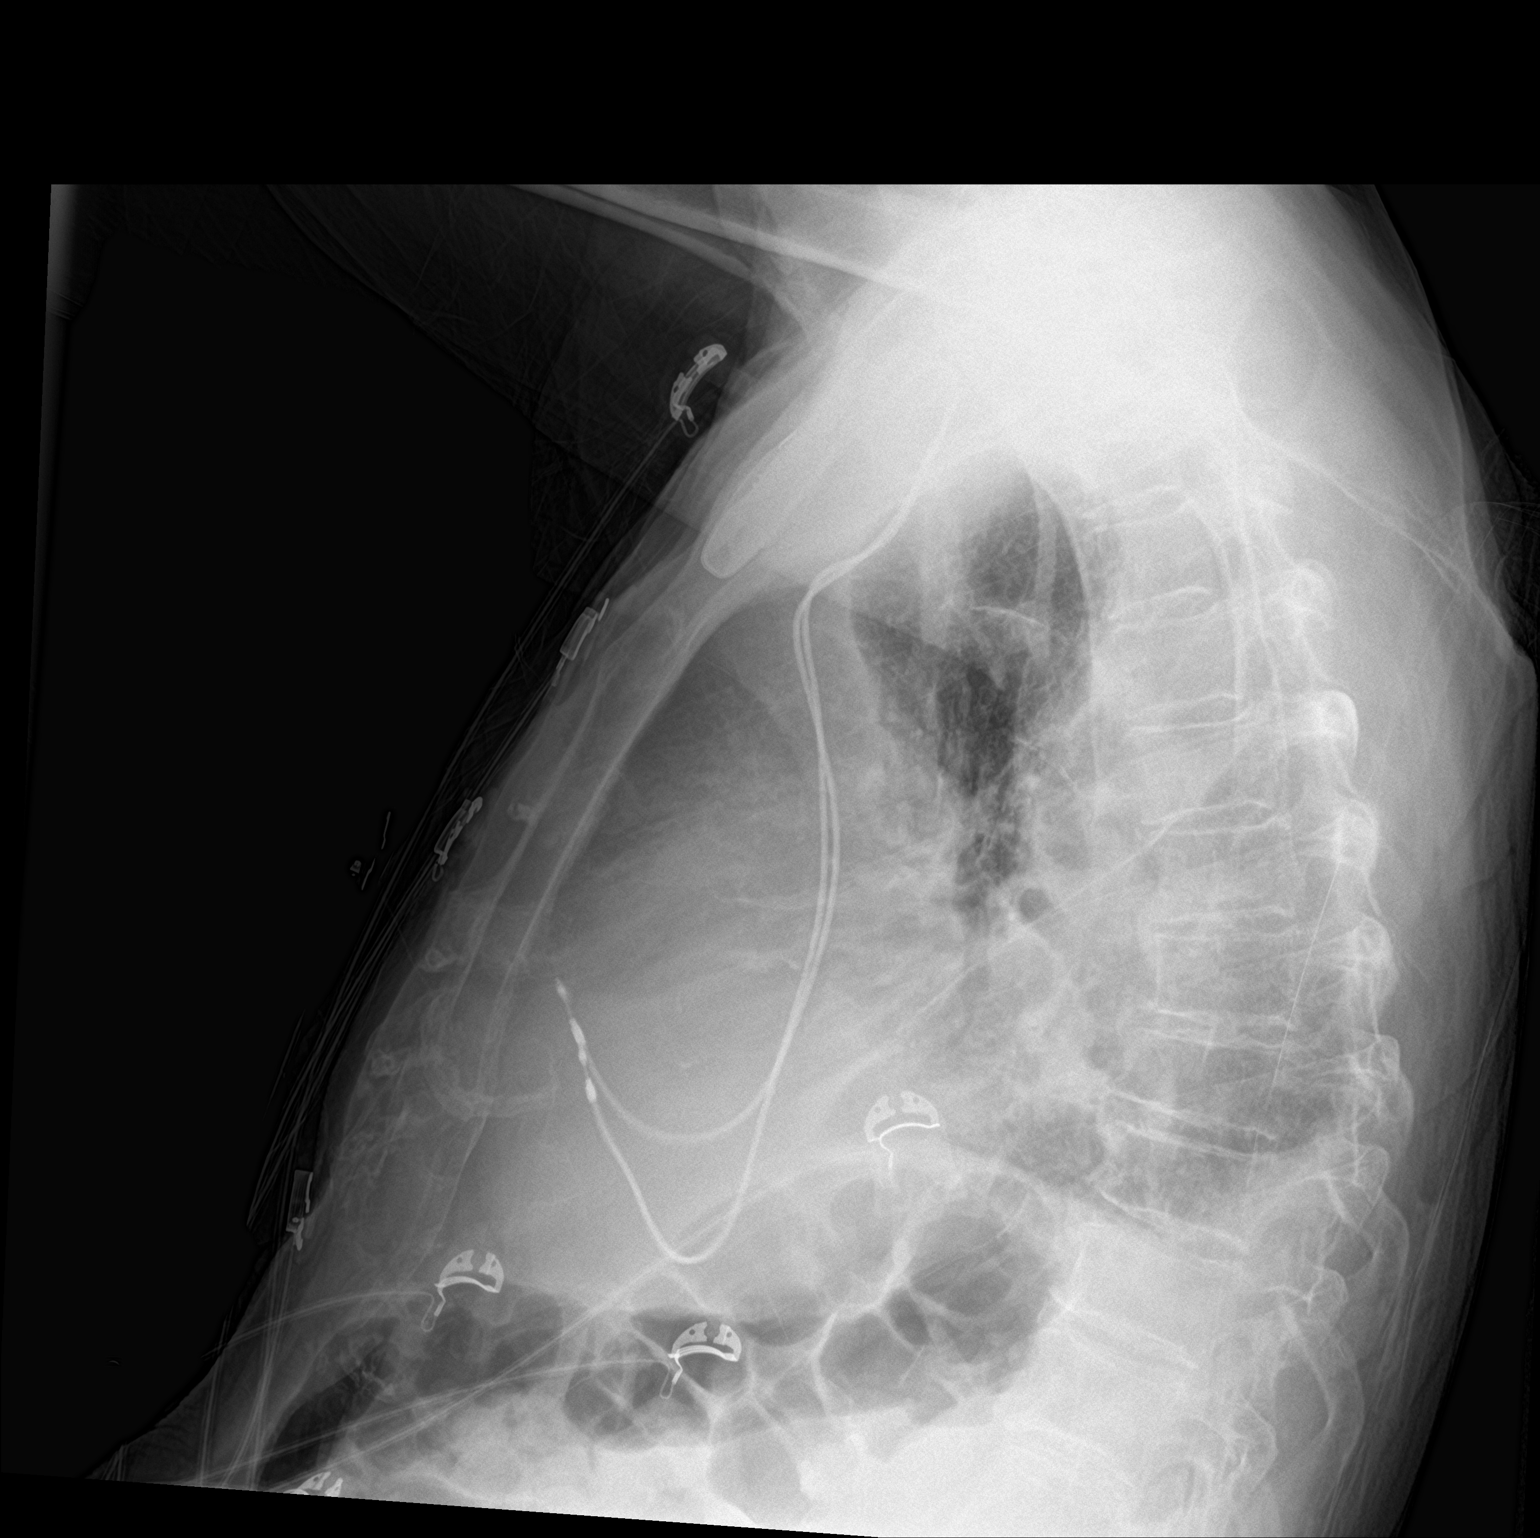

[2 of 2 positions shown; findings below may reference images not displayed]

FINDINGS: Stable cardiomediastinal silhouette. Left-sided pacemaker is
unchanged in position. No pneumothorax is noted. Interval
development of moderate right pleural effusion with associated
atelectasis or pneumonia. Bony thorax is unremarkable.
IMPRESSION: Interval development of moderate right pleural effusion with
associated atelectasis or pneumonia.

Aortic Atherosclerosis ([id number]).
